# Patient Record
Sex: Female | Born: 1945
Health system: Southern US, Community
[De-identification: ages and names within clinical notes are randomized; demographics above are authoritative.]

## PROBLEM LIST (undated history)

## (undated) DIAGNOSIS — M179 Osteoarthritis of knee, unspecified: Secondary | ICD-10-CM

## (undated) DIAGNOSIS — L718 Other rosacea: Secondary | ICD-10-CM

## (undated) DIAGNOSIS — Z860100 Personal history of colon polyps, unspecified: Secondary | ICD-10-CM

## (undated) DIAGNOSIS — M1990 Unspecified osteoarthritis, unspecified site: Secondary | ICD-10-CM

## (undated) DIAGNOSIS — K623 Rectal prolapse: Secondary | ICD-10-CM

## (undated) DIAGNOSIS — C44320 Squamous cell carcinoma of skin of unspecified parts of face: Secondary | ICD-10-CM

## (undated) DIAGNOSIS — A159 Respiratory tuberculosis unspecified: Secondary | ICD-10-CM

## (undated) DIAGNOSIS — Z87440 Personal history of urinary (tract) infections: Secondary | ICD-10-CM

## (undated) DIAGNOSIS — H269 Unspecified cataract: Secondary | ICD-10-CM

## (undated) DIAGNOSIS — E785 Hyperlipidemia, unspecified: Secondary | ICD-10-CM

## (undated) DIAGNOSIS — K649 Unspecified hemorrhoids: Secondary | ICD-10-CM

## (undated) DIAGNOSIS — F1910 Other psychoactive substance abuse, uncomplicated: Secondary | ICD-10-CM

## (undated) DIAGNOSIS — F32A Depression, unspecified: Secondary | ICD-10-CM

## (undated) DIAGNOSIS — R7612 Nonspecific reaction to cell mediated immunity measurement of gamma interferon antigen response without active tuberculosis: Secondary | ICD-10-CM

## (undated) DIAGNOSIS — M1710 Unilateral primary osteoarthritis, unspecified knee: Secondary | ICD-10-CM

## (undated) DIAGNOSIS — R159 Full incontinence of feces: Secondary | ICD-10-CM

## (undated) DIAGNOSIS — I10 Essential (primary) hypertension: Secondary | ICD-10-CM

## (undated) DIAGNOSIS — F1020 Alcohol dependence, uncomplicated: Secondary | ICD-10-CM

## (undated) DIAGNOSIS — K589 Irritable bowel syndrome without diarrhea: Secondary | ICD-10-CM

## (undated) DIAGNOSIS — K219 Gastro-esophageal reflux disease without esophagitis: Secondary | ICD-10-CM

## (undated) DIAGNOSIS — T7840XA Allergy, unspecified, initial encounter: Secondary | ICD-10-CM

## (undated) DIAGNOSIS — F419 Anxiety disorder, unspecified: Secondary | ICD-10-CM

## (undated) DIAGNOSIS — H10829 Rosacea conjunctivitis, unspecified eye: Secondary | ICD-10-CM

## (undated) HISTORY — DX: Respiratory tuberculosis unspecified: A15.9

## (undated) HISTORY — DX: Other rosacea: L71.8

## (undated) HISTORY — DX: Hyperlipidemia, unspecified: E78.5

## (undated) HISTORY — DX: Osteoarthritis of knee, unspecified: M17.9

## (undated) HISTORY — DX: Other psychoactive substance abuse, uncomplicated: F19.10

## (undated) HISTORY — DX: Rosacea conjunctivitis, unspecified eye: H10.829

## (undated) HISTORY — DX: Alcohol dependence, uncomplicated: F10.20

## (undated) HISTORY — DX: Unilateral primary osteoarthritis, unspecified knee: M17.10

## (undated) HISTORY — PX: TUBAL LIGATION: SHX77

## (undated) HISTORY — PX: BREAST SURGERY: SHX581

## (undated) HISTORY — PX: BREAST EXCISIONAL BIOPSY: SUR124

## (undated) HISTORY — PX: FOOT SURGERY: SHX648

## (undated) HISTORY — DX: Nonspecific reaction to cell mediated immunity measurement of gamma interferon antigen response without active tuberculosis: R76.12

## (undated) HISTORY — DX: Irritable bowel syndrome, unspecified: K58.9

## (undated) HISTORY — DX: Unspecified osteoarthritis, unspecified site: M19.90

## (undated) HISTORY — DX: Rectal prolapse: K62.3

## (undated) HISTORY — DX: Gastro-esophageal reflux disease without esophagitis: K21.9

## (undated) HISTORY — DX: Personal history of urinary (tract) infections: Z87.440

## (undated) HISTORY — DX: Essential (primary) hypertension: I10

## (undated) HISTORY — PX: TONSILLECTOMY AND ADENOIDECTOMY: SUR1326

## (undated) HISTORY — DX: Personal history of colonic polyps: Z86.010

## (undated) HISTORY — DX: Unspecified hemorrhoids: K64.9

## (undated) HISTORY — DX: Unspecified cataract: H26.9

## (undated) HISTORY — PX: SHOULDER SURGERY: SHX246

## (undated) HISTORY — PX: HAND SURGERY: SHX662

## (undated) HISTORY — DX: Personal history of colon polyps, unspecified: Z86.0100

## (undated) HISTORY — DX: Allergy, unspecified, initial encounter: T78.40XA

## (undated) HISTORY — DX: Full incontinence of feces: R15.9

## (undated) HISTORY — DX: Depression, unspecified: F32.A

## (undated) HISTORY — DX: Anxiety disorder, unspecified: F41.9

---
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
## 1998-07-10 ENCOUNTER — Encounter: Admission: RE | Admit: 1998-07-10 | Discharge: 1998-10-08 | Payer: Self-pay | Admitting: Family Medicine

## 1998-08-25 ENCOUNTER — Ambulatory Visit (HOSPITAL_BASED_OUTPATIENT_CLINIC_OR_DEPARTMENT_OTHER): Admission: RE | Admit: 1998-08-25 | Discharge: 1998-08-25 | Payer: Self-pay | Admitting: Orthopaedic Surgery

## 1999-04-27 ENCOUNTER — Ambulatory Visit (HOSPITAL_COMMUNITY): Admission: RE | Admit: 1999-04-27 | Discharge: 1999-04-27 | Payer: Self-pay | Admitting: *Deleted

## 2000-02-25 ENCOUNTER — Other Ambulatory Visit: Admission: RE | Admit: 2000-02-25 | Discharge: 2000-02-25 | Payer: Self-pay | Admitting: *Deleted

## 2000-06-26 ENCOUNTER — Other Ambulatory Visit: Admission: RE | Admit: 2000-06-26 | Discharge: 2000-06-26 | Payer: Self-pay | Admitting: *Deleted

## 2001-02-26 ENCOUNTER — Other Ambulatory Visit: Admission: RE | Admit: 2001-02-26 | Discharge: 2001-02-26 | Payer: Self-pay | Admitting: *Deleted

## 2001-05-11 ENCOUNTER — Encounter (INDEPENDENT_AMBULATORY_CARE_PROVIDER_SITE_OTHER): Payer: Self-pay

## 2001-05-11 ENCOUNTER — Other Ambulatory Visit: Admission: RE | Admit: 2001-05-11 | Discharge: 2001-05-11 | Payer: Self-pay | Admitting: *Deleted

## 2001-06-20 ENCOUNTER — Encounter: Payer: Self-pay | Admitting: Obstetrics and Gynecology

## 2001-06-22 ENCOUNTER — Encounter (INDEPENDENT_AMBULATORY_CARE_PROVIDER_SITE_OTHER): Payer: Self-pay | Admitting: Specialist

## 2001-06-22 ENCOUNTER — Ambulatory Visit (HOSPITAL_COMMUNITY): Admission: RE | Admit: 2001-06-22 | Discharge: 2001-06-22 | Payer: Self-pay | Admitting: Obstetrics and Gynecology

## 2001-07-18 ENCOUNTER — Ambulatory Visit (HOSPITAL_COMMUNITY): Admission: RE | Admit: 2001-07-18 | Discharge: 2001-07-18 | Payer: Self-pay | Admitting: Gastroenterology

## 2001-07-18 ENCOUNTER — Encounter (INDEPENDENT_AMBULATORY_CARE_PROVIDER_SITE_OTHER): Payer: Self-pay

## 2001-08-06 ENCOUNTER — Encounter: Payer: Self-pay | Admitting: Gastroenterology

## 2001-08-06 ENCOUNTER — Encounter: Admission: RE | Admit: 2001-08-06 | Discharge: 2001-08-06 | Payer: Self-pay | Admitting: Gastroenterology

## 2002-02-27 ENCOUNTER — Other Ambulatory Visit: Admission: RE | Admit: 2002-02-27 | Discharge: 2002-02-27 | Payer: Self-pay | Admitting: *Deleted

## 2002-07-03 ENCOUNTER — Ambulatory Visit (HOSPITAL_COMMUNITY): Admission: RE | Admit: 2002-07-03 | Discharge: 2002-07-03 | Payer: Self-pay | Admitting: Family Medicine

## 2003-03-07 ENCOUNTER — Other Ambulatory Visit: Admission: RE | Admit: 2003-03-07 | Discharge: 2003-03-07 | Payer: Self-pay | Admitting: *Deleted

## 2004-03-10 ENCOUNTER — Other Ambulatory Visit: Admission: RE | Admit: 2004-03-10 | Discharge: 2004-03-10 | Payer: Self-pay | Admitting: *Deleted

## 2005-03-14 ENCOUNTER — Other Ambulatory Visit: Admission: RE | Admit: 2005-03-14 | Discharge: 2005-03-14 | Payer: Self-pay | Admitting: *Deleted

## 2007-04-04 ENCOUNTER — Ambulatory Visit (HOSPITAL_BASED_OUTPATIENT_CLINIC_OR_DEPARTMENT_OTHER): Admission: RE | Admit: 2007-04-04 | Discharge: 2007-04-04 | Payer: Self-pay | Admitting: Orthopedic Surgery

## 2009-12-21 LAB — HM PAP SMEAR: HM Pap smear: NORMAL

## 2010-05-11 ENCOUNTER — Ambulatory Visit: Payer: Self-pay | Admitting: Sports Medicine

## 2010-05-11 DIAGNOSIS — M7520 Bicipital tendinitis, unspecified shoulder: Secondary | ICD-10-CM | POA: Insufficient documentation

## 2010-05-11 DIAGNOSIS — M25519 Pain in unspecified shoulder: Secondary | ICD-10-CM | POA: Insufficient documentation

## 2010-06-17 ENCOUNTER — Ambulatory Visit: Payer: Self-pay | Admitting: Sports Medicine

## 2010-06-18 ENCOUNTER — Encounter: Payer: Self-pay | Admitting: Family Medicine

## 2010-06-28 ENCOUNTER — Encounter: Admission: RE | Admit: 2010-06-28 | Discharge: 2010-07-15 | Payer: Self-pay | Admitting: Family Medicine

## 2010-07-15 ENCOUNTER — Ambulatory Visit: Payer: Self-pay | Admitting: Sports Medicine

## 2010-09-15 ENCOUNTER — Encounter: Admission: RE | Admit: 2010-09-15 | Discharge: 2010-09-15 | Payer: Self-pay | Admitting: Family Medicine

## 2010-09-15 ENCOUNTER — Ambulatory Visit: Payer: Self-pay | Admitting: Sports Medicine

## 2010-09-22 ENCOUNTER — Ambulatory Visit: Payer: Self-pay | Admitting: Sports Medicine

## 2010-11-03 ENCOUNTER — Ambulatory Visit: Payer: Self-pay | Admitting: Sports Medicine

## 2010-12-19 DIAGNOSIS — R7612 Nonspecific reaction to cell mediated immunity measurement of gamma interferon antigen response without active tuberculosis: Secondary | ICD-10-CM

## 2010-12-19 DIAGNOSIS — A159 Respiratory tuberculosis unspecified: Secondary | ICD-10-CM

## 2010-12-19 HISTORY — DX: Nonspecific reaction to cell mediated immunity measurement of gamma interferon antigen response without active tuberculosis: R76.12

## 2010-12-19 HISTORY — DX: Respiratory tuberculosis unspecified: A15.9

## 2011-01-18 NOTE — Assessment & Plan Note (Signed)
Summary: NP,R SHOULDER/ARM PAIN,MC   Vital Signs:  Patient profile:   65 year old female Height:      62 inches Weight:      127 pounds BMI:     23.31 BP sitting:   144 / 89  Vitals Entered By: Lillia Pauls CMA (May 11, 2010 10:37 AM)  History of Present Illness: Pt presents for right shoulder pain that she has now had for many years since an injury which has become worse for the past two months. She is right hand dominant. She says that a few years ago she was walking a neighbor's dog when the 60 lb dog suddenly lurched forward hurting her shoulder. She had bruising over the anterior aspect of her upper arm and had decreased range of motion for a while. She never saw a physician for this injury. Since then she has had intermittent pain which she has treated with a variety of exercises that trainers have shown her at her local gym. However, recently her pain has not decreased with the normal exercises. She denies any recent injuries. The pain is now constant and worse with trying to put her hand behind her back. She describes having a bone spur previously removed from her right shoulder years ago. No other pior injuries. She cannot tolerate NSAIDs because of her stomach. Tramadol does not help very much. She gets this from her PCP Dr. Raquel James. She has been resting her right shoulder without any improvement. Does wake her from sleep.   Allergies (verified): 1)  ! Pcn 2)  ! Sulfa 3)  ! Septra  Physical Exam  General:  alert and well-developed.   Head:  normocephalic and atraumatic.   Eyes:  Wears glasses Ears:  Normal hearing Mouth:  MMM Neck:  supple.   Lungs:  normal respiratory effort.   Msk:  Right Shoulder: No bony abnormalities, edema or bruising No Popeye deformity or step offs appreciated + TTP over head of biceps Full forward flexion and abduction 4/5 strength with resisted internal rotation 5/5 strength with resisted external rotation Neg Obrien's and Empty can Able to  put her hand behind her back to her back pocket + Neer's, + Hawkin's Neg Cross over + Speed's with little strength 5/5 strength with resisted biceps curl and triceps testing Weakness with resisted supination which causes pain 5/5 strength with resisted pronation  Left Shoulder: Normal inspection, palpation, ROM and strength Normal special testing Able to put her hand behind her back to L1 Normal biceps, triceps, and deltoid strength Additional Exam:  U/S of her right shoulder shows a normal supraspinatus, subscapularis, teres minor and infraspinatus. No signs of internal impingement. Obvious midsubstance tear of biceps tendon with edema. Possible scarring of distall biceps. Images saved for documentation.    Impression & Recommendations:  Problem # 1:  SHOULDER PAIN, RIGHT (ICD-719.41) Due to old biceps tendon tear of the right shoulder 1. Given a Theraband with an exercise program to start daily for rotator cuff strengthening. Also given a gentle biceps tendon strengthening exercise program with bicep curls and wrist rotations to do daily with a 1-2 lb weight. 2. Given an Rx for nitroglycerin - use as directed. Warned about headaches and monitoring blood pressure. 3. Return in 4 weeks for follow-up.  Orders: Theraband per yard ([id number]) [place] LIMITED [phone])  Problem # 2:  BICEPS TENDINITIS (ICD-726.12)  See above for treatment plan  Orders: [place] LIMITED ([id number])  Complete Medication List: 1)  Nitroglycerin 0.2 Mg/hr Pt24 (Nitroglycerin) .Marland KitchenMarland KitchenMarland Kitchen  Apply 1/4 patch to right shoulder  q 24 hours  Patient Instructions: 1)  Use a 1-2 lb weight and do gentle biceps curls. Quickly bring your hand up to your shoulder and then slowly drop your hand back down towards your legs. Do three sets of 10 first and build up to three sets of 30. 2)  Use again a 1-2 lb weight and rotate your wrist from side to side like you are opening a door. 3)  Use the Theraband to do some of the rotator cuff exercises  to strengthen your shoulder overall. Do these daily. 4)  Use 1/4 of a nitroglycerin patch over your right shoulder every 24 hours. Change after a shower. You may develop headaches the first 3-4 days but this is because of a drop on your blood pressure. You can take Tylenol for the headaches if you develop them. If you develop any dizziness, then take off the patch.  5)  We will see you back in 3-4 weeks for follow-up.  Prescriptions: NITROGLYCERIN 0.2 MG/HR PT24 (NITROGLYCERIN) apply 1/4 patch to right shoulder  Q 24 hours  #10 x 1   Entered by:   Lillia Pauls CMA   Authorized by:   Jannifer Rodney MD   Signed by:   Lillia Pauls CMA on 05/11/2010   Method used:   Electronically to        [facility]. [phone]* (retail)       [address]       [place], [place]  [address]       Ph: [phone]       Fax: [phone]   RxID:   [id number] NITROGLYCERIN 0.2 MG/HR PT24 (NITROGLYCERIN) apply 1/4 patch to right shoulder  Q 24 hours  #10 x 1   Entered and Authorized by:   Jannifer Rodney MD   Signed by:   Jannifer Rodney MD on 05/11/2010   Method used:   Electronically to        [facility]. [phone]* (retail)       [address]       [place], [place]  [address]       Ph: [phone]       Fax: [phone]   RxID:   [id number]

## 2011-01-18 NOTE — Assessment & Plan Note (Signed)
Summary: F/U,MC   Vital Signs:  Patient profile:   65 year old female BP sitting:   135 / 96  Vitals Entered By: Lillia Pauls CMA (June 17, 2010 1:42 PM)  History of Present Illness: Pt presents for follow-up of a partial right biceps tendon tear that likely occurred over a year ago when a dog she was walking lurched forward and hurt her arm. Since her last office visit, she has been doing a home therapy program and is gradually getting stronger. She believes that she is at least 5% better. Her pain has decreased and her right shoulder is no longer waking her up from sleep. She did get headaches the first two days with the nitroglycerin patches but those have since gone away. She feels that she has better strength and is doing her home exercises with a 2lb weight.  Allergies: 1)  ! Pcn 2)  ! Sulfa 3)  ! Septra  Physical Exam  General:  alert and well-developed.   Head:  normocephalic and atraumatic.   Eyes:  Wears glasses Ears:  Normal hearing Mouth:  MMM Neck:  supple and full ROM.   Lungs:  normal respiratory effort.   Msk:  RIght Shoulder: Normal inspection Full ROM without pain with forward flexion and abduction 5/5 strength with resisted IR and ER 4/5 strength with Speed's testing and bicep curls + TTP over midsubstance of mid biceps No TTP over AC joint or shoulder itself Able to put her right hand behind her back to about L2 + Neer's at the extreme, + Hawkin's with some tightness, + Cross over No pain with Yergeson's 5/5 strength with triceps and deltoid testing Starting to almost feel like a frozen shoulder  Left Shoulder: Normal inspection, palpation, ROM and strength Neg special testing   Impression & Recommendations:  Problem # 1:  BICEPS TENDINITIS (ICD-726.12) Assessment Improved Probably a little more than 5% better but still has some strengthening 1. Will refer for physical therapy to keep her from developing a frozen shoulder 2. Will increase her  nitroglycerin to 1/2 patch daily if she can tolerate this. Her blood pressure is still elevated so this will likely be ok. I have asked her to check her BP at home since she has a monitor at home. 3. Return in 4-6 weeks for follow-up. 4. Continue icing. She does not tolerate oral NSAIDs well because of her stomach but could consider a Voltaren gel. 5. Continue home exercises but slowly increase weight per therapist.   Problem # 2:  SHOULDER PAIN, RIGHT (ICD-719.41) Assessment: Improved  Starting to get a little stiff but not painful enough that the patient desires a steroid injection today 1. Will refer for physical therapy to prevent a frozen shoulder if possible and to help with biceps strengthening  Orders: Physical Therapy Referral (PT)  Complete Medication List: 1)  Nitroglycerin 0.2 Mg/hr Pt24 (Nitroglycerin) .... Apply 1/4-1/2 patch to right shoulder  q 24 hours Prescriptions: NITROGLYCERIN 0.2 MG/HR PT24 (NITROGLYCERIN) apply 1/4-1/2 patch to right shoulder  Q 24 hours  #1 box x 2   Entered and Authorized by:   Jannifer Rodney MD   Signed by:   Jannifer Rodney MD on 06/18/2010   Method used:   Electronically to        [facility]. [phone]* (retail)       [address]       [place], [place]  [address]  Ph: [phone]       Fax: [phone]   RxID:   [id number]

## 2011-01-18 NOTE — Assessment & Plan Note (Signed)
Summary: U/S SHOULDER,MC   Vital Signs:  Patient profile:   65 year old female Pulse rate:   90 / minute BP sitting:   107 / 75  (right arm)  Vitals Entered By: Rochele Pages RN (November 03, 2010 1:36 PM) CC: f/u rt shoulder- 75% improved   CC:  f/u rt shoulder- 75% improved.  History of Present Illness: 65 yo F f/u Rt shoulder pain.  Previously had mild frozen shoulder, but improved.  Repeat US 6 weeks ago showed bic ten tear.  RC intact.  Xray also shows mild AC arthritis. States turned corner, pain 75% better. Still has a little pain and stiffness with reaching OH.  Mostly anterior location. Still currently on NTG 1/2 patch daily. Stopped mobic because of upset stomach. Still doing RC exercises.  Preventive Screening-Counseling & Management  Alcohol-Tobacco     Smoking Status: quit > 6 months  Allergies: 1)  ! Pcn 2)  ! Sulfa 3)  ! Septra  Social History: Smoking Status:  quit > 6 months  Physical Exam  General:  Well-developed,well-nourished,in no acute distress; alert,appropriate and cooperative throughout examination Msk:  Rt shoulder: mild ttp over bic groove.  F flex 160 deg, Abd 160 deg, ER 45 deg, IR L4.  FROM on Lt shoulder.  Nl RC strength.  Neg impingement.  Mild pain with Speed's, neg yergasson's.  No ttp over AC.  [place]: No obvious tear or increased doppler flow on bic tendon.  RC tendons all intact.   Impression & Recommendations:  Problem # 1:  BICEPS TENDINITIS (ICD-726.12) Assessment Improved  Greatly improved - since has been on NTG patch x 6 months, ok to stop - continue rehab exercises - f/u as needed if worsens  Orders: [place] LIMITED ([id number])  Complete Medication List: 1)  Nitroglycerin 0.2 Mg/hr Pt24 (Nitroglycerin) .... Apply 1/4-1/2 patch to right shoulder  q 24 hours 2)  Lisinopril-hydrochlorothiazide 20-25 Mg Tabs (Lisinopril-hydrochlorothiazide) .Marland Kitchen.. 1 by mouth qd 3)  Trazodone Hcl 150 Mg Tabs (Trazodone hcl) .Marland Kitchen.. 1 by mouth qd 4)   Lovastatin 40 Mg Tabs (Lovastatin) .... 2 by mouth qhs 5)  Tramadol Hcl 50 Mg Tabs (Tramadol hcl) .Marland Kitchen.. 1 by mouth bid   Orders Added: 1)  Est. Patient Level III [60454] 2)  [place] LIMITED [[id number]]

## 2011-01-18 NOTE — Assessment & Plan Note (Signed)
Summary: f/u,mc   Vital Signs:  Patient profile:   65 year old female Height:      62 inches BP sitting:   126 / 78  (left arm)  Vitals Entered By: Tessie Fass CMA (July 15, 2010 1:36 PM) CC: F/U right shoulder   CC:  F/U right shoulder.  History of Present Illness: Patient is f/u of chronic RT shoulder pain sleeps thru nite Not much pain ADLs ecept reaching too far  has gained almost full motion since PT  tolerating half patch w NTG  Strength is much better but a few exercises hurt  on first scan she had fairly high grade partial tear of bicipital tendon findings of early Adh capsulitis    Allergies: 1)  ! Pcn 2)  ! Sulfa 3)  ! Septra  Physical Exam  General:  Well-developed,well-nourished,in no acute distress; alert,appropriate and cooperative throughout examination Msk:  RT Inspection reveals no abnormalities or assymetry; no atrophy noted; palpation is unremarkable;  ROM is full in all planes after looseining. back scratch is limited on RT to about T10 . specific strength testing of Rotator cuff mm reveals good strength throughout; no signs of impingement; speeds and yergason's tests normal;  no labral pathology noted; norm scapular function observed.  negative painful arc and no drop arm sign.  Additional Exam:  MSK [place] there is still a partial tear of bicipital tendon but now the degree of cystic fluid collection is less the tendinous structure is improved hypoechoic areas still noted abut 4 cm below biciptial notch  images noted   Impression & Recommendations:  Problem # 1:  SHOULDER PAIN, RIGHT (ICD-719.41)  much improved  cont NTG  at half patch as we see signs of healing  Orders: [place] LIMITED ([id number])  Problem # 2:  BICEPS TENDINITIS (ICD-726.12)  steady improvement  will follow but allow to switch to a home exercise program  Orders: [place] LIMITED ([id number])  Complete Medication List: 1)  Nitroglycerin 0.2 Mg/hr Pt24 (Nitroglycerin) ....  Apply 1/4-1/2 patch to right shoulder  q 24 hours  Patient Instructions: 1)  return in 2 mos 2)  keep using NTG 3)  do home exercises with band 4)  keep weight exercises to level of comfort - drop to 1 or 2 lbs until no pain 5)  be careful lifting anything heavy 6)  OK to cancel PT

## 2011-01-18 NOTE — Assessment & Plan Note (Signed)
Summary: SHOULDER U/S PER FIELDS,MC   Vital Signs:  Patient profile:   65 year old female Pulse rate:   75 / minute BP sitting:   132 / 88  (right arm)  Vitals Entered By: Rochele Pages RN (September 22, 2010 2:41 PM) CC: f/u rt shoulder u/s today   CC:  f/u rt shoulder u/s today.  History of Present Illness: 65 yo F here for R shoulder f/u.  Had prev been dxed with biceps tendon tear, but treatment plateued. Had 3-V R shoulder with mild AC joint arthritis. Shoulder still hurts, but mobic helping some. Still difficult reaching back. Presents today for repeat US of R shoulder. Continues on NTG patch.  Allergies: 1)  ! Pcn 2)  ! Sulfa 3)  ! Septra  Physical Exam  General:  Well-developed,well-nourished,in no acute distress; alert,appropriate and cooperative throughout examination Msk:  Shoulder: Inspection reveals no abnormalities, atrophy or asymmetry. ROM is full in all planes on L shoulder, L shoulder f flex 160 deg, abd 170 deg, IR to L3, ER to 30-45 deg. Rotator cuff strength normal throughout. Speeds test normal. No apprehension sign  MSK [place] of R shoulder: small tear in biceps tendon 4 cm distal to bic groove, with small amt of fluid in sheath.  Subscap, supraspin, and infraspin all intact.      Impression & Recommendations:  Problem # 1:  BICEPS TENDINITIS (ICD-726.12) At this point, still has a mild tear with some residual fluid in sheath.  Unclear if this will ever heal entirely. - continue NTG patch - f/u for repeat scan in 6 weeks  Problem # 2:  SHOULDER PAIN, RIGHT (ICD-719.41) Some 2/2 above, but also has some chronic shoulder decreased ROM and pain Only mild AC arthritis with possible mild frozen shoulder as well.  however RC strength is great - continue ROM exercises - continue mobic - f/u 6 weeks  Her updated medication list for this problem includes:    Tramadol Hcl 50 Mg Tabs (Tramadol hcl) .Marland Kitchen... 1 by mouth bid    Meloxicam 7.5 Mg Tabs (Meloxicam)  .Marland Kitchen... 1 tab by mouth qd  Complete Medication List: 1)  Nitroglycerin 0.2 Mg/hr Pt24 (Nitroglycerin) .... Apply 1/4-1/2 patch to right shoulder  q 24 hours 2)  Lisinopril-hydrochlorothiazide 20-25 Mg Tabs (Lisinopril-hydrochlorothiazide) .Marland Kitchen.. 1 by mouth qd 3)  Trazodone Hcl 150 Mg Tabs (Trazodone hcl) .Marland Kitchen.. 1 by mouth qd 4)  Lovastatin 40 Mg Tabs (Lovastatin) .... 2 by mouth qhs 5)  Tramadol Hcl 50 Mg Tabs (Tramadol hcl) .Marland Kitchen.. 1 by mouth bid 6)  Meloxicam 7.5 Mg Tabs (Meloxicam) .Marland Kitchen.. 1 tab by mouth qd  Appended Document: SHOULDER U/S PER FIELDS,MC

## 2011-01-18 NOTE — Letter (Signed)
Summary: [facility] referral form  [facility] referral form   Imported By: Marily Memos 06/29/2010 11:53:06  _____________________________________________________________________  External Attachment:    Type:   Image     Comment:   External Document

## 2011-01-18 NOTE — Miscellaneous (Signed)
Summary: [facility]  [facility]   Imported By: Marily Memos 07/16/2010 09:02:58  _____________________________________________________________________  External Attachment:    Type:   Image     Comment:   External Document

## 2011-01-18 NOTE — Assessment & Plan Note (Signed)
Summary: F/U,MC   Vital Signs:  Patient profile:   65 year old female Pulse rate:   77 / minute BP sitting:   146 / 82  (right arm) CC: f/u rt arm pain   CC:  f/u rt arm pain.  History of Present Illness: 65 yo F with chronic R shoulder pain and partially torn biceps tendon here for f/u. Is currently on 1/2 NTG patch per day.  Does not think it is helping. Still doing her home exercises. Has done formal PT. Has gotten better since 5/11 but no change over last 2 months. Still having fair amount of pain constantly and mildly decreased ROM.  Allergies: 1)  ! Pcn 2)  ! Sulfa 3)  ! Septra  Physical Exam  General:  Well-developed,well-nourished,in no acute distress; alert,appropriate and cooperative throughout examination Msk:  Shoulder: Inspection reveals no abnormalities, atrophy or asymmetry. Palpation with some crepitus over AC joint and mild TTP over bicipital groove. ROM nl on L side, R shouler 160-170 deg with for flex and abd, L4 on IR, and 30 deg on ER. Rotator cuff strength normal throughout. No signs of impingement with negative Neer and Hawkin's tests, empty can. Yergason's test normal, Speed's with mild weakness and pain. + mild pain cross adduction.      Impression & Recommendations:  Problem # 1:  SHOULDER PAIN, RIGHT (ICD-719.41) Assessment Unchanged Previously felt it was all biceps tear/tendinitis/tendinosis.  Still has some findings c/w this, however also with decreased ROM worrisome for GH or AC OA as well as mild continued frozen shoulder. - check 3-V of shoulder - trial of meloxicam 7.5 mg qd - RTC 1 week for full diagnostic US of R shoulder - ok to stop NTG patch - continue home exercises  Her updated medication list for this problem includes:    Tramadol Hcl 50 Mg Tabs (Tramadol hcl) .Marland Kitchen... 1 by mouth bid    Meloxicam 7.5 Mg Tabs (Meloxicam) .Marland Kitchen... 1 tab by mouth qd  Orders: Diagnostic X-Ray/Fluoroscopy (Diagnostic X-Ray/Flu)  Problem # 2:   BICEPS TENDINITIS (ICD-726.12) improvement has plateaued, ok to stop NTG patches  Complete Medication List: 1)  Nitroglycerin 0.2 Mg/hr Pt24 (Nitroglycerin) .... Apply 1/4-1/2 patch to right shoulder  q 24 hours 2)  Lisinopril-hydrochlorothiazide 20-25 Mg Tabs (Lisinopril-hydrochlorothiazide) .Marland Kitchen.. 1 by mouth qd 3)  Trazodone Hcl 150 Mg Tabs (Trazodone hcl) .Marland Kitchen.. 1 by mouth qd 4)  Lovastatin 40 Mg Tabs (Lovastatin) .... 2 by mouth qhs 5)  Tramadol Hcl 50 Mg Tabs (Tramadol hcl) .Marland Kitchen.. 1 by mouth bid 6)  Meloxicam 7.5 Mg Tabs (Meloxicam) .Marland Kitchen.. 1 tab by mouth qd Prescriptions: MELOXICAM 7.5 MG TABS (MELOXICAM) 1 tab by mouth qd  #30 x 1   Entered by:   Enid Baas MD   Authorized by:   Corbin Ade MD   Signed by:   Enid Baas MD on 09/15/2010   Method used:   Electronically to        [facility]. [phone]* (retail)       [address]       [place], [place]  [address]       Ph: [phone]       Fax: [phone]   RxID:   [id number]

## 2011-02-15 LAB — LIPID PANEL
HDL: 104 mg/dL — AB (ref 35–70)
LDL Cholesterol: 100 mg/dL
Triglycerides: 48 mg/dL (ref 40–160)

## 2011-05-06 NOTE — Op Note (Signed)
[facility]  Patient:    Cheryl Kent, Cheryl Kent                          MRN: [id number] Proc. Date: 06/22/01 Attending:  Laqueta Linden, M.D.                           Operative Report  OUTPATIENT  PREOPERATIVE DIAGNOSIS:  Postmenopausal bleeding, possible atypical polyp.  POSTOPERATIVE DIAGNOSIS:  Postmenopausal bleeding, possible atypical polyp.  OPERATION/PROCEDURE:  Hysteroscopy with dilatation and curettage.  SURGEON: Laqueta Linden, M.D.  ANESTHESIA:  MAC sedation with paracervical block.  ESTIMATED BLOOD LOSS:  Less than 10 cc.  Net sorbitol intake was 0.  SPECIMENS:  Curettings sent to pathology.  COMPLICATIONS:  None.  INDICATIONS:  Cheryl Kent is a 65 year old menopausal female on hormonal replacement therapy with postmenopausal bleeding.  She underwent evaluation with pelvic ultrasound and sonohystogram revealing a 4.3 mm endometrial stripe with calcifications at the endometrial, myometrial junction with no focal thickening.  Sonohystogram confirmed the calcifications, but had no evidence of focal lesion.  She then underwent endometrial sampling in the office which revealed a fragment of benign endometrial polyp with scanty proliferative type endometrium with a minute focus of glandular atypia within the polyp that was felt to possibly be reactive metastatic change, although the amount of atypia was very minute.  Due to this question of an atypical polyp it was felt appropriate to proceed to a more thorough evaluation including hysteroscopy with curettage and resection as indicated.  The patient has seen the informed consent film, been extensively counseled as to the risks, benefits, alternatives, and complications and agrees to proceed.  A full consent has been given.  DESCRIPTION OF PROCEDURE:  The patient was taken to the operating room and after proper identification and consent was obtained she was placed on the operating table in a  supine position.  After IV sedation was accomplished and she was placed in the [place] stirrups and the perineum and vagina were prepped and draped in a routine sterile fashion.  A transurethral Foley was placed which was removed at the conclusion of the procedure.  Bimanual examination confirmed a midplane, normal size uterus.  A speculum was placed in the vagina and the cervix grasped with a single-tooth tenaculum.  A paracervical block utilizing 10 cc of 1% plain Xylocaine was then placed circumferentially.  The uterine cavity sounded to 8 cm.  The internal os was gently dilated to a #23 Pratt dilator.  The viewing scope with continuous sorbitol infusion was then inserted, ______ was used throughout the procedure.  The endocervical canal was free of lesions.  There was a slightly erythematous area noted on the fundus which was felt to possibly be related to the dilators.  Both tubal ostia were visualized.  Several small calcifications were noted.  There was a tiny polyp noted on the left uterine side wall.  The scope was removed.  Sharp curettage was then performed with a moderate amount of tissue obtained.  The scope was then replaced and no focal defects were noted.  The tiny polyps had been removed.  The specimen was sent to pathology.  All instruments were removed.  There was no active bleeding from the cervix or the tenaculum site.  Net sorbitol intake was 0.  The patient was stable on transfer to the recovery room.  She will be observed and discharged per anesthesia  protocol.  She was given routine verbal and written discharge instructions.  She is to continue on her current medications and to take Advil or Aleve as needed for any cramping.  She is to follow up in the office in four to six weeks time or sooner for excessive pain, fever, bleeding, or other concerns. DD:  06/22/01 TD:  06/22/01 Job: [id number]

## 2011-05-06 NOTE — Procedures (Signed)
[place]. [facility]  Patient:    Cheryl Kent, Cheryl Kent                          MRN: [id number] Proc. Date: 07/18/01 Adm. Date:  [id number] Attending:  Charna Elizabeth CC:         Talmadge Coventry, M.D.   Procedure Report  DATE OF BIRTH: 1946-01-23  PROCEDURE: Colonoscopy with biopsies.  ENDOSCOPIST: Anselmo Rod, M.D.  INSTRUMENT USED: Olympus video colonoscope.  INDICATIONS FOR PROCEDURE: This is a 65 year old white female with a history of rectal bleeding and fecal incontinence, with recent change in bowel habits, to rule out colonic polyps, masses, hemorrhoids, etc.  PREPROCEDURE PREPARATION: Informed consent was procured from the patient and the patient fasted for eight hours prior to the procedure and prepped with a bottle of magnesium citrate and gallon of NuLytely the night prior to the procedure.  PREPROCEDURE PHYSICAL EXAMINATION:  VITAL SIGNS: Stable.  NECK: Supple.  CHEST: Clear to auscultation.  HEART: S1 and S2, regular.  ABDOMEN: Soft, nondistended, nontender, with normal bowel sounds.  DESCRIPTION OF PROCEDURE: The patient was placed in the left lateral decubitus position and sedated with 70 mg of Demerol and 5 mg of Versed intravenously. Once the patient was adequately sedated and maintained on low-flow oxygen with continuous cardiac monitoring the Olympus video colonoscope was advanced from the rectum to the cecum with slight difficulty secondary to a large amount of residual in the colon.  On anal inspection and examination there was significantly decreased sphincter tone.  A small flat polyp was biopsied at 20 cm.  The patient has some left-sided diverticulosis.  No large masses or polyps were seen.  Small lesions could have been missed secondary to relatively inadequate prep.  IMPRESSION:  1. Decreased sphincter tone, which may be contributing to the patients     fecal incontinence.  2. Small flat polyp biopsied at 20  cm.  3. Left-sided diverticulosis.  4. Large amount of residual stool in the colon, small lesions may have been     missed.  5. Question extrinsic compression of rectum.  RECOMMENDATIONS:  1. Await pathology results.  2. High fiber diet.  3. Outpatient follow-up for further evaluation of symptoms. DD:  07/18/01 TD:  07/19/01 Job: [id number]

## 2011-05-06 NOTE — Op Note (Signed)
Cheryl Kent, Cheryl Kent NO.:  [id number]   MEDICAL RECORD NO.:  [id number]          PATIENT TYPE:  AMB   LOCATION:  DSC                          FACILITY:  [facility]   PHYSICIAN:  Artist Pais. Weingold, M.D.DATE OF BIRTH:  03-10-1946   DATE OF PROCEDURE:  04/04/2007  DATE OF DISCHARGE:                               OPERATIVE REPORT   PREOPERATIVE DIAGNOSIS:  Chronic right thumb stenosing tenosynovitis.   POSTOPERATIVE DIAGNOSIS:  Chronic right thumb stenosing tenosynovitis.   PROCEDURE:  Right thumb A1 pulley release.   SURGEON:  Artist Pais. Mina Marble, M.D.   ASSISTANT:  None.   ANESTHESIA:  Monitored anesthesia care with a combination of 2% plain  lidocaine, 0.25% Marcaine field block 3 mL.   TOURNIQUET TIME:  10 minutes.   COMPLICATIONS:  None.   DRAINS:  None.   OPERATIVE REPORT:  The patient was taken to the operating suite.  After  the induction of adequate IV sedation, right upper extremity is prepped  and draped a sterile fashion. The Esmarch was used to exsanguinate the  limb.  The tourniquet was inflated to 150 mmHg.  At this point in time,  3 mL of a combination of 2% plain lidocaine and 0.25% plain Marcaine was  injected into the A1 pulley area.  A transverse incision was made.  Neurovascular bundles were gently retracted.  The A1 pulley was split  __________  was lysed of all adhesions.  The wound was irrigated and  this was closed with 4-0 nylon x3 sutures.  Xeroform, 4 x 4's,  compressive wrap was applied.  The patient tolerated the procedure well.      Artist Pais Mina Marble, M.D.  Electronically Signed     MAW/MEDQ  D:  04/04/2007  T:  04/04/2007  Job:  [id number]

## 2011-06-16 ENCOUNTER — Other Ambulatory Visit: Payer: Self-pay | Admitting: Infectious Diseases

## 2011-06-16 ENCOUNTER — Ambulatory Visit
Admission: RE | Admit: 2011-06-16 | Discharge: 2011-06-16 | Disposition: A | Discharge status: Home / Self Care | Payer: No Typology Code available for payment source | Source: Ambulatory Visit | Attending: Infectious Diseases | Admitting: Infectious Diseases

## 2011-06-16 DIAGNOSIS — R7611 Nonspecific reaction to tuberculin skin test without active tuberculosis: Secondary | ICD-10-CM

## 2011-09-26 LAB — HEPATIC FUNCTION PANEL: ALT: 53 U/L — AB (ref 7–35)

## 2011-11-23 DIAGNOSIS — K219 Gastro-esophageal reflux disease without esophagitis: Secondary | ICD-10-CM | POA: Insufficient documentation

## 2011-11-23 DIAGNOSIS — I10 Essential (primary) hypertension: Secondary | ICD-10-CM | POA: Insufficient documentation

## 2011-11-23 DIAGNOSIS — F411 Generalized anxiety disorder: Secondary | ICD-10-CM | POA: Insufficient documentation

## 2011-11-23 DIAGNOSIS — E7800 Pure hypercholesterolemia, unspecified: Secondary | ICD-10-CM | POA: Insufficient documentation

## 2011-11-24 ENCOUNTER — Encounter: Payer: Self-pay | Admitting: Internal Medicine

## 2011-11-24 ENCOUNTER — Ambulatory Visit (INDEPENDENT_AMBULATORY_CARE_PROVIDER_SITE_OTHER): Payer: Medicare Other | Admitting: Internal Medicine

## 2011-11-24 VITALS — BP 129/83 | HR 95 | Temp 98.1°F | Wt 128.0 lb

## 2011-11-24 DIAGNOSIS — Z227 Latent tuberculosis: Secondary | ICD-10-CM

## 2011-11-24 DIAGNOSIS — R7611 Nonspecific reaction to tuberculin skin test without active tuberculosis: Secondary | ICD-10-CM | POA: Insufficient documentation

## 2011-11-24 DIAGNOSIS — A150 Tuberculosis of lung: Secondary | ICD-10-CM

## 2011-11-24 LAB — COMPREHENSIVE METABOLIC PANEL
ALT: 41 U/L — ABNORMAL HIGH (ref 0–35)
AST: 49 U/L — ABNORMAL HIGH (ref 0–37)
BUN: 12 mg/dL (ref 6–23)
CO2: 33 mEq/L — ABNORMAL HIGH (ref 19–32)
Creat: 0.83 mg/dL (ref 0.50–1.10)
Total Bilirubin: 0.7 mg/dL (ref 0.3–1.2)

## 2011-11-24 MED ORDER — RIFAPENTINE 150 MG PO TABS: 900.0000 mg | ORAL_TABLET | ORAL | Status: DC

## 2011-11-24 MED ORDER — ISONIAZID 300 MG PO TABS: 900.0000 mg | ORAL_TABLET | ORAL | Status: AC

## 2011-11-24 NOTE — Progress Notes (Signed)
INFECTIOUS DISEASES CLINIC  INITIAL VISIT  RFV: latent TB  Subjective:    Patient ID: Cheryl Kent, female    DOB: 1946-01-02, 65 y.o.   MRN: [id number]  HPI Cheryl Kent is a [name] that has history of HTN, HLD, GAD who enrolled as a volunteer for [facility] cancer center. She was found to have PPD+/quantiferon gold+ in June 2012. She underwent CXR that showed small granuloma in apex of RUL, but did not subscribe to cough, weight loss, fever/chills/nightsweats. She was referred to health department to start 9 month INH/vit B6 regimen. In her routine blood work, she was noted to start having increases in her transaminitis at the end of 1st and 2nd month. Due to this concern that it might worsen, given that she will be on therapy for 9 months, she discontinued her medication. The patient denies any jaundice, scleral icterus, RUQ pain, N/V/D, dark urine. The patient does not know how she was exposed to TB, denies international travel, working at homeless shelter. She does subscribe to having been reckless in her 30s with heavy alcohol abuse for which she attended a rehab program and has abstained since the age of 65. She denies having cirrhosis, or complications of cirrhosis. She did mention that they told her she had evidence of liver disease that it would improve over the years that she is sober.  The patient also mentioned that despite having increases in her transaminases, the health department did not suggest that she should stop therapy.The only records we have is from 09/26/11 where her AST 91 and ALT 53, reflecting improvement from the peak. The patient's PCP has referred her to the ID clinic for alternative regimens. The patient is concerned with any side effects that the latent TB treatment may have with her current meds, specifically trazadone for which she uses for her underlying anxiety disorder.  Meds:  crestor Lisinopril HCTZ trazadone 150mg  QHS Asa 81mg  daily mvi Xanax 0.25mg  PRN anxiety, usually  3x per week Tramadol 50mg  daily  All: penicillin as a infant- unknown SE,  Intolerance: bactrim - had sores under her tongue(no swelling, no rash)  SH: she is a widow x 2. No children. Step-children. She is retired worked in a Primary school teacher work. She volunteers at [employer] at cancer center. No present alcohol, smoking or illicit drug use.   FH: stroke, DM, alzheimer's disease   Review of Systems  Constitutional: Negative.  Negative for fever, chills, diaphoresis and fatigue.  HENT: Negative for ear pain, congestion, rhinorrhea, mouth sores, neck pain and neck stiffness.   Eyes: Negative.   Respiratory: Negative for cough, choking, chest tightness, shortness of breath and wheezing.   Cardiovascular: Negative for chest pain and leg swelling.  Gastrointestinal: Negative for abdominal pain, diarrhea, constipation, blood in stool and abdominal distention.  Genitourinary: Negative for dysuria, frequency, flank pain and difficulty urinating.  Musculoskeletal: Negative for myalgias, back pain, joint swelling and arthralgias.  Skin: Negative for color change, pallor and rash.  Neurological: Negative.   Hematological: Negative.   Psychiatric/Behavioral: Positive for sleep disturbance.       Slightly anxious       Objective:BP 129/83  Pulse 95  Temp(Src) 98.1 F (36.7 C) (Oral)  Wt 128 lb (58.06 kg)    Physical Exam  Constitutional: She is oriented to person, place, and time. She appears well-developed. No distress.  HENT:  Right Ear: External ear normal.  Left Ear: External ear normal.  Nose: Nose normal.  Mouth/Throat: Oropharynx  is clear and moist. No oropharyngeal exudate.  Eyes: Conjunctivae are normal. Pupils are equal, round, and reactive to light. Right eye exhibits no discharge. Left eye exhibits no discharge. No scleral icterus.  Neck: Normal range of motion. Neck supple. No tracheal deviation present.  Cardiovascular: Normal rate, regular rhythm and normal heart sounds.   Exam reveals no gallop and no friction rub.   No murmur heard. Pulmonary/Chest: Effort normal and breath sounds normal. No stridor. No respiratory distress. She has no wheezes. She has no rales. She exhibits no tenderness.  Abdominal: Soft. Bowel sounds are normal. She exhibits no distension and no mass. There is no tenderness. There is no rebound and no guarding.  Musculoskeletal: Normal range of motion. She exhibits no edema and no tenderness.  Lymphadenopathy:    She has no cervical adenopathy.  Neurological: She is alert and oriented to person, place, and time. No cranial nerve deficit. She exhibits normal muscle tone. Coordination normal.  Skin: Skin is warm and dry. No rash noted. She is not diaphoretic. No erythema. No pallor.  Psychiatric: She has a normal mood and affect.   Old labs/imaging: Cxr: granuloma in Right hilar area     Assessment & Plan:  65 yo Female with latent TB who had mild transaminitis on INH x 2 months, switching regimen for treatment.  1) will check baseline LFT at this visit  2) will propose to do 12-wk regimen on INH 900mg  and Rifapentin 900mg  Q wk dosing, as per CDC recommendations. Patient is motivated to do treatment and is willing to check in weekly so that she has taken her medications.   3) we will check LFTs initially weekly to see that she does not have transaminitis/hepatitis 4) if patient has hepatitis side effect, then we will do rifampin daily x 4 month regimen.  5) anxiety = will on INH and rifapentin, the patient's trazadone maybe metabolized quicklier. If she feels that her anxiety symptoms are worsening, we will titrate up her dose to 200mg  daily. Making sure to got back to home dose once she finishes treatment  Patient's cell: [phone] Pharm: [facility]    Recommendations for Use of an Isoniazid-Rifapentine Regimen with Direct Observation to Treat Latent Mycobacterium tuberculosis Infection. MMWR  2011;60:1650-1653

## 2011-11-28 ENCOUNTER — Ambulatory Visit (INDEPENDENT_AMBULATORY_CARE_PROVIDER_SITE_OTHER): Payer: Medicare Other | Admitting: Family Medicine

## 2011-11-28 DIAGNOSIS — F411 Generalized anxiety disorder: Secondary | ICD-10-CM

## 2011-11-28 DIAGNOSIS — J060 Acute laryngopharyngitis: Secondary | ICD-10-CM

## 2011-11-30 ENCOUNTER — Telehealth: Payer: Self-pay | Admitting: *Deleted

## 2011-11-30 NOTE — Telephone Encounter (Signed)
Chills/fever/headache lasting 3-4 hours within 24 hours of taking INH on Sunday, 11/27/11.  Pt wanted Dr. Drue Second know about these side effects.  No symptoms after taking Rifapentin on Monday.   Pt requesting a call back from Center.  [phone]  Please advise.

## 2011-12-07 ENCOUNTER — Other Ambulatory Visit: Payer: Self-pay | Admitting: Internal Medicine

## 2011-12-07 DIAGNOSIS — Z79899 Other long term (current) drug therapy: Secondary | ICD-10-CM

## 2011-12-07 DIAGNOSIS — R7611 Nonspecific reaction to tuberculin skin test without active tuberculosis: Secondary | ICD-10-CM

## 2011-12-08 ENCOUNTER — Other Ambulatory Visit: Payer: Medicare Other

## 2011-12-08 DIAGNOSIS — Z79899 Other long term (current) drug therapy: Secondary | ICD-10-CM

## 2011-12-08 DIAGNOSIS — R7611 Nonspecific reaction to tuberculin skin test without active tuberculosis: Secondary | ICD-10-CM

## 2011-12-08 LAB — HEPATIC FUNCTION PANEL
AST: 50 U/L — ABNORMAL HIGH (ref 0–37)
Albumin: 3.9 g/dL (ref 3.5–5.2)
Alkaline Phosphatase: 51 U/L (ref 39–117)
Indirect Bilirubin: 0.3 mg/dL (ref 0.0–0.9)
Total Bilirubin: 0.5 mg/dL (ref 0.3–1.2)

## 2012-01-15 ENCOUNTER — Encounter: Payer: Self-pay | Admitting: Family Medicine

## 2012-01-25 ENCOUNTER — Encounter: Payer: Self-pay | Admitting: Family Medicine

## 2012-01-30 ENCOUNTER — Ambulatory Visit (INDEPENDENT_AMBULATORY_CARE_PROVIDER_SITE_OTHER): Payer: Medicare Other | Admitting: Family Medicine

## 2012-01-30 ENCOUNTER — Encounter: Payer: Self-pay | Admitting: Family Medicine

## 2012-01-30 DIAGNOSIS — E7800 Pure hypercholesterolemia, unspecified: Secondary | ICD-10-CM

## 2012-01-30 DIAGNOSIS — E785 Hyperlipidemia, unspecified: Secondary | ICD-10-CM

## 2012-01-30 DIAGNOSIS — R7611 Nonspecific reaction to tuberculin skin test without active tuberculosis: Secondary | ICD-10-CM

## 2012-01-30 DIAGNOSIS — I10 Essential (primary) hypertension: Secondary | ICD-10-CM

## 2012-01-30 DIAGNOSIS — F411 Generalized anxiety disorder: Secondary | ICD-10-CM

## 2012-01-30 DIAGNOSIS — K219 Gastro-esophageal reflux disease without esophagitis: Secondary | ICD-10-CM

## 2012-01-30 LAB — LIPID PANEL
HDL: 76 mg/dL (ref >39)
LDL Cholesterol: 89 mg/dL (ref 0–99)
Total CHOL/HDL Ratio: 2.3 Ratio
Triglycerides: 45 mg/dL (ref <150)
VLDL: 9 mg/dL (ref 0–40)

## 2012-01-30 LAB — COMPREHENSIVE METABOLIC PANEL
AST: 138 U/L — ABNORMAL HIGH (ref 0–37)
Albumin: 3.8 g/dL (ref 3.5–5.2)
Alkaline Phosphatase: 50 U/L (ref 39–117)
BUN: 19 mg/dL (ref 6–23)
Creat: 0.77 mg/dL (ref 0.50–1.10)
Glucose, Bld: 101 mg/dL — ABNORMAL HIGH (ref 70–99)
Potassium: 3.9 mEq/L (ref 3.5–5.3)
Total Bilirubin: 0.4 mg/dL (ref 0.3–1.2)

## 2012-01-30 LAB — TSH: TSH: 1.639 u[IU]/mL (ref 0.350–4.500)

## 2012-01-30 MED ORDER — ROSUVASTATIN CALCIUM 10 MG PO TABS: 10.0000 mg | ORAL_TABLET | Freq: Every day | ORAL | Status: DC

## 2012-01-30 MED ORDER — LISINOPRIL-HYDROCHLOROTHIAZIDE 20-25 MG PO TABS: 1.0000 | ORAL_TABLET | Freq: Every day | ORAL | Status: DC

## 2012-01-30 MED ORDER — TRAZODONE HCL 150 MG PO TABS: 150.0000 mg | ORAL_TABLET | Freq: Every day | ORAL | Status: DC

## 2012-01-30 MED ORDER — PAROXETINE HCL 20 MG PO TABS: 20.0000 mg | ORAL_TABLET | Freq: Every day | ORAL | Status: DC

## 2012-01-30 NOTE — Assessment & Plan Note (Signed)
Tolerating antihypertensive medication without side effects. Needs refills.  Denies CP, SOB, visual disturbance or focal deficits.

## 2012-01-30 NOTE — Assessment & Plan Note (Signed)
Currently without symptoms 

## 2012-01-30 NOTE — Assessment & Plan Note (Signed)
Doing well on Paxil however recently experiencing constipations secondary to Paxil.   Using Miralax daily with relief of constipation. Patient increased Trazadone to 150 mg and now is sleeping through the night. Needs refill of Trazadone.

## 2012-01-30 NOTE — Assessment & Plan Note (Signed)
Patient has been compliant with Crestor without side effects. Here for lipid panel and refills.  Exercises 4-5 times a week for 1 hour. Avoids cholesterol rich foods. (Has seen nutrition in the past)

## 2012-01-30 NOTE — Progress Notes (Signed)
  Subjective:    Patient ID: Cheryl Kent, female    DOB: 1946-07-05, 66 y.o.   MRN: [id number]  HPI Patient presents for routine follow up.  Needing medication refills and blood work.     Review of Systems  Constitutional: Negative.   Eyes: Negative for visual disturbance.  Respiratory: Negative for shortness of breath.   Cardiovascular: Negative for chest pain and leg swelling.       Objective:   Physical Exam  Constitutional: She appears well-developed and well-nourished.  HENT:  Head: Normocephalic and atraumatic.  Eyes: EOM are normal. Pupils are equal, round, and reactive to light. No scleral icterus.  Neck: Neck supple. No thyromegaly present.  Cardiovascular: Normal rate, regular rhythm and normal heart sounds.   Pulmonary/Chest: Effort normal and breath sounds normal.  Abdominal: Soft. Bowel sounds are normal. There is no hepatosplenomegaly.  Neurological: She is alert.  Skin: Skin is warm.  Psychiatric: She has a normal mood and affect.          Assessment & Plan:  GAD (generalized anxiety disorder) Doing well on Paxil however recently experiencing constipations secondary to Paxil.   Using Miralax daily with relief of constipation. Patient increased Trazadone to 150 mg and now is sleeping through the night. Needs refill of Trazadone.  PPD positive Patient will complete 12 weeks of TB therapy 2/25.  She will follow up with Dr. Drue Second in 2 weeks and will have a final blood draw to check her liver enzymes and (?) interferon TB gold.  Hypercholesteremia Patient has been compliant with Crestor without side effects. Here for lipid panel and refills.  Exercises 4-5 times a week for 1 hour. Avoids cholesterol rich foods. (Has seen nutrition in the past)  HTN (hypertension) Tolerating antihypertensive medication without side effects. Needs refills.  Denies CP, SOB, visual disturbance or focal deficits.  GERD (gastroesophageal reflux disease) Currently  with out symptoms

## 2012-01-30 NOTE — Assessment & Plan Note (Signed)
Patient will complete 12 weeks of TB therapy 2/25.  She will follow up with Dr. Drue Second in 2 weeks and will have a final blood draw to check her liver enzymes and (?) interferon TB gold.

## 2012-02-15 ENCOUNTER — Telehealth: Payer: Self-pay | Admitting: *Deleted

## 2012-02-15 NOTE — Telephone Encounter (Signed)
Patient called to say that she will be done with her Tb treatment this week and was told by provider to have labs done the following week. She just needs to get an appointment. I advised her will let the provider know find our what labs are needed and when she needs to come back to see the provider. I will give her a call as soon as the provider gets back to me on this.

## 2012-02-20 ENCOUNTER — Other Ambulatory Visit: Payer: Medicare Other

## 2012-02-20 ENCOUNTER — Other Ambulatory Visit: Payer: Self-pay | Admitting: Internal Medicine

## 2012-02-20 DIAGNOSIS — R7611 Nonspecific reaction to tuberculin skin test without active tuberculosis: Secondary | ICD-10-CM

## 2012-02-20 LAB — HEPATIC FUNCTION PANEL
ALT: 83 U/L — ABNORMAL HIGH (ref 0–35)
Albumin: 3.8 g/dL (ref 3.5–5.2)
Indirect Bilirubin: 0.4 mg/dL (ref 0.0–0.9)
Total Protein: 5.7 g/dL — ABNORMAL LOW (ref 6.0–8.3)

## 2012-03-13 ENCOUNTER — Telehealth: Payer: Self-pay | Admitting: *Deleted

## 2012-03-13 NOTE — Telephone Encounter (Signed)
Pt called asking what the results were for the lab she had drawn here at last OV. I gave her the results. She had additional questions & wanted Dr. Drue Second to call her  [phone]

## 2012-04-30 ENCOUNTER — Encounter: Payer: Self-pay | Admitting: Family Medicine

## 2012-04-30 ENCOUNTER — Ambulatory Visit (INDEPENDENT_AMBULATORY_CARE_PROVIDER_SITE_OTHER): Payer: Medicare Other | Admitting: Family Medicine

## 2012-04-30 VITALS — BP 120/75 | HR 79 | Temp 97.8°F | Resp 16 | Ht 62.0 in | Wt 130.0 lb

## 2012-04-30 DIAGNOSIS — K219 Gastro-esophageal reflux disease without esophagitis: Secondary | ICD-10-CM

## 2012-04-30 DIAGNOSIS — R7611 Nonspecific reaction to tuberculin skin test without active tuberculosis: Secondary | ICD-10-CM

## 2012-04-30 DIAGNOSIS — K739 Chronic hepatitis, unspecified: Secondary | ICD-10-CM

## 2012-04-30 DIAGNOSIS — F411 Generalized anxiety disorder: Secondary | ICD-10-CM

## 2012-04-30 DIAGNOSIS — I10 Essential (primary) hypertension: Secondary | ICD-10-CM

## 2012-04-30 DIAGNOSIS — E785 Hyperlipidemia, unspecified: Secondary | ICD-10-CM

## 2012-04-30 DIAGNOSIS — K759 Inflammatory liver disease, unspecified: Secondary | ICD-10-CM

## 2012-04-30 DIAGNOSIS — E7800 Pure hypercholesterolemia, unspecified: Secondary | ICD-10-CM

## 2012-04-30 LAB — COMPREHENSIVE METABOLIC PANEL
ALT: 52 U/L — ABNORMAL HIGH (ref 0–35)
AST: 58 U/L — ABNORMAL HIGH (ref 0–37)
Albumin: 3.9 g/dL (ref 3.5–5.2)
Alkaline Phosphatase: 55 U/L (ref 39–117)
BUN: 19 mg/dL (ref 6–23)
Calcium: 9.9 mg/dL (ref 8.4–10.5)
Chloride: 102 mEq/L (ref 96–112)
Potassium: 4 mEq/L (ref 3.5–5.3)
Sodium: 141 mEq/L (ref 135–145)

## 2012-04-30 MED ORDER — CLONAZEPAM 0.5 MG PO TABS: ORAL_TABLET | ORAL | Status: DC

## 2012-04-30 NOTE — Progress Notes (Signed)
Addended by: Dois Davenport on: 04/30/2012 10:03 AM   Modules accepted: Orders

## 2012-04-30 NOTE — Progress Notes (Addendum)
  Subjective:    Patient ID: Cheryl Kent, female    DOB: 12-Mar-1946, 66 y.o.   MRN: [id number]  HPI  Patient presents in routine follow up  Positive PPD- completed 12 weeks of once weekly INH and Rifampin; early on with INH hepatitis however                          when switched to once weekly therapy liver enzymes improved dramatically.  GAD- patient weaned herself off Paxil secondary to problems with constipation. Would like to           try prn alprazolam.  Dyslipidemia- compliant with Crestor without side effects.  GERD- occasionally symptomatic; "night eater; tried to quit and I just can't"; sleeps on wedge  Skin check- history of extensive sum damage  Constipation- chronic; Miralax and high fiber diet daily   Health Maintenance  Colonoscopy- 2013(Dr. Loreta Ave); follow up 10 years Mammogram- 07/2011 Pap- 2011(normal) Tdap- 2009   Review of Systems     Objective:   Physical Exam  Constitutional: She appears well-developed.  Neck: Neck supple. No thyromegaly present.  Cardiovascular: Normal rate, regular rhythm and normal heart sounds.   Pulmonary/Chest: Effort normal and breath sounds normal.  Abdominal: Soft. Bowel sounds are normal. She exhibits mass. There is no hepatosplenomegaly. There is no tenderness.  Neurological: She is alert.  Skin: Skin is warm. Rash: extensive sum damage.        Assessment & Plan:   1. Hypercholesteremia    2. HTN (hypertension)    3. GERD (gastroesophageal reflux disease)    4. GAD (generalized anxiety disorder)  clonazePAM (KLONOPIN) 0.5 MG tablet  5. INH hepatitis  Comprehensive metabolic panel  6. Positive PPD, treated       Continue current medications Follow up 3 months

## 2012-07-10 ENCOUNTER — Other Ambulatory Visit: Payer: Self-pay | Admitting: Family Medicine

## 2012-07-30 ENCOUNTER — Telehealth: Payer: Self-pay | Admitting: Radiology

## 2012-07-30 MED ORDER — TRAMADOL HCL 50 MG PO TABS: 50.0000 mg | ORAL_TABLET | Freq: Two times a day (BID) | ORAL | Status: DC | PRN

## 2012-07-30 NOTE — Telephone Encounter (Signed)
Renewal for patients tramadol sent in, pt will need office visit for next.

## 2012-08-01 ENCOUNTER — Ambulatory Visit (INDEPENDENT_AMBULATORY_CARE_PROVIDER_SITE_OTHER): Payer: Medicare Other | Admitting: Physician Assistant

## 2012-08-01 VITALS — BP 120/84 | HR 83 | Temp 98.0°F | Resp 18 | Ht 61.5 in | Wt 130.0 lb

## 2012-08-01 DIAGNOSIS — E785 Hyperlipidemia, unspecified: Secondary | ICD-10-CM

## 2012-08-01 DIAGNOSIS — E7800 Pure hypercholesterolemia, unspecified: Secondary | ICD-10-CM

## 2012-08-01 DIAGNOSIS — I10 Essential (primary) hypertension: Secondary | ICD-10-CM

## 2012-08-01 DIAGNOSIS — F411 Generalized anxiety disorder: Secondary | ICD-10-CM

## 2012-08-01 LAB — LIPID PANEL
Cholesterol: 189 mg/dL (ref 0–200)
HDL: 76 mg/dL (ref >39)
Total CHOL/HDL Ratio: 2.5 Ratio
VLDL: 13 mg/dL (ref 0–40)

## 2012-08-01 LAB — COMPREHENSIVE METABOLIC PANEL
AST: 35 U/L (ref 0–37)
BUN: 17 mg/dL (ref 6–23)
Calcium: 9.5 mg/dL (ref 8.4–10.5)
Chloride: 104 mEq/L (ref 96–112)
Creat: 0.83 mg/dL (ref 0.50–1.10)
Total Bilirubin: 0.6 mg/dL (ref 0.3–1.2)

## 2012-08-01 MED ORDER — LISINOPRIL-HYDROCHLOROTHIAZIDE 20-25 MG PO TABS: 1.0000 | ORAL_TABLET | Freq: Every day | ORAL | Status: DC

## 2012-08-01 MED ORDER — TRAMADOL HCL 50 MG PO TABS: 50.0000 mg | ORAL_TABLET | Freq: Two times a day (BID) | ORAL | Status: DC | PRN

## 2012-08-01 MED ORDER — ROSUVASTATIN CALCIUM 10 MG PO TABS: 10.0000 mg | ORAL_TABLET | Freq: Every day | ORAL | Status: DC

## 2012-08-01 MED ORDER — CLONAZEPAM 0.5 MG PO TABS: 0.5000 mg | ORAL_TABLET | Freq: Once | ORAL | Status: DC

## 2012-08-01 MED ORDER — TRAZODONE HCL 150 MG PO TABS: 150.0000 mg | ORAL_TABLET | Freq: Every day | ORAL | Status: DC

## 2012-08-02 ENCOUNTER — Encounter: Payer: Self-pay | Admitting: Physician Assistant

## 2012-08-02 NOTE — Progress Notes (Signed)
  Subjective:    Patient ID: Cheryl Kent, female    DOB: 1946/01/07, 66 y.o.   MRN: [id number]  HPI 66 yo CF previously cared for by Dr. Hal Hope. She is ok on the meds she is taking now.  She has a sick cat and is having a lot of anxiety right now.  She is unsure how much the clonazepam is working for her.  She takes 1 daily.  Previously she was on cymbalta and thinks she did pretty well with for her anxiety and her pain.  She is very active "almost addicted to exercise."  She went to [employer] when she was 13 and has been sober since.  Review of Systems  All other systems reviewed and are negative.      Objective:   Physical Exam  Nursing note and vitals reviewed. Constitutional: She is oriented to person, place, and time. She appears well-developed and well-nourished.  HENT:  Head: Normocephalic and atraumatic.  Cardiovascular: Normal rate, regular rhythm and normal heart sounds.   Pulmonary/Chest: Effort normal and breath sounds normal.  Neurological: She is alert and oriented to person, place, and time.  Skin: Skin is warm and dry.  Psychiatric: She has a normal mood and affect. Her behavior is normal. Judgment and thought content normal.          Assessment & Plan:  Hypertension-stable Arthritic pain-tramadol 1-2 time daily is fine.  Anxiety-she doesn't want to make any changes at this time.  She may come back in a couple of months and [place] try cymbalta and try weaning off of the clonazepam.  She will think about it.  Otherwise, I will see her back in 6 months. Hyperlipidemia-continue crestor. Ok to fill all meds at current doses X 6 months.

## 2012-09-08 ENCOUNTER — Ambulatory Visit (INDEPENDENT_AMBULATORY_CARE_PROVIDER_SITE_OTHER): Payer: Medicare Other | Admitting: Internal Medicine

## 2012-09-08 VITALS — BP 98/58 | HR 85 | Temp 98.6°F | Resp 16 | Ht 61.25 in | Wt 130.8 lb

## 2012-09-08 DIAGNOSIS — R509 Fever, unspecified: Secondary | ICD-10-CM

## 2012-09-08 NOTE — Progress Notes (Signed)
  Subjective:    Patient ID: Cheryl Kent, female    DOB: 01/01/1946, 66 y.o.   MRN: [id number]  HPIfever this am 102 Associated with headache and nausea but no vomiting Took one Advil now free of fever No other GI or GU symptoms No cough cold symptoms or shortness of breath No skin rash  Patient Active Problem List  Diagnosis  . SHOULDER PAIN, RIGHT  . BICEPS TENDINITIS  . Hypercholesteremia  . HTN (hypertension)  . GERD (gastroesophageal reflux disease)  . GAD (generalized anxiety disorder)  . PPD positive--Recently completed 3 months of INH    Review of Systems No other symptoms of systemic illness    Objective:   Physical Exam Vital signs within normal limits No acute distress HEENT clear Heart regular without murmur Lungs clear to auscultation Skin clear Extremities no rashes or edema       Assessment & Plan:  Problem #1 fever likely secondary to viral infection Followup as needed for further symptoms

## 2012-10-26 ENCOUNTER — Other Ambulatory Visit: Payer: Self-pay | Admitting: Physician Assistant

## 2012-11-07 ENCOUNTER — Encounter: Payer: Self-pay | Admitting: Physician Assistant

## 2012-11-07 ENCOUNTER — Ambulatory Visit (INDEPENDENT_AMBULATORY_CARE_PROVIDER_SITE_OTHER): Payer: Medicare Other | Admitting: Physician Assistant

## 2012-11-07 VITALS — BP 106/68 | HR 75 | Temp 97.9°F | Resp 18 | Wt 131.0 lb

## 2012-11-07 DIAGNOSIS — R7989 Other specified abnormal findings of blood chemistry: Secondary | ICD-10-CM

## 2012-11-07 DIAGNOSIS — R509 Fever, unspecified: Secondary | ICD-10-CM

## 2012-11-07 DIAGNOSIS — M810 Age-related osteoporosis without current pathological fracture: Secondary | ICD-10-CM

## 2012-11-07 DIAGNOSIS — M461 Sacroiliitis, not elsewhere classified: Secondary | ICD-10-CM

## 2012-11-07 LAB — CBC WITH DIFFERENTIAL/PLATELET
Basophils Absolute: 0.1 10*3/uL (ref 0.0–0.1)
Basophils Relative: 1 % (ref 0–1)
Eosinophils Absolute: 0.2 10*3/uL (ref 0.0–0.7)
Eosinophils Relative: 2 % (ref 0–5)
MCH: 32.9 pg (ref 26.0–34.0)
MCV: 96 fL (ref 78.0–100.0)
Neutrophils Relative %: 52 % (ref 43–77)
Platelets: 296 10*3/uL (ref 150–400)
RDW: 12.4 % (ref 11.5–15.5)

## 2012-11-07 MED ORDER — METAXALONE 800 MG PO TABS: 800.0000 mg | ORAL_TABLET | Freq: Three times a day (TID) | ORAL | Status: DC

## 2012-11-07 MED ORDER — CLONAZEPAM 0.5 MG PO TABS: 0.5000 mg | ORAL_TABLET | Freq: Once | ORAL | Status: DC

## 2012-11-07 NOTE — Progress Notes (Signed)
  Subjective:    Patient ID: Cheryl Kent, female    DOB: 16-Aug-1946, 66 y.o.   MRN: [id number]  HPI fevers about 2X/year. Usu til about 101 and last 2 days without other symptoms. She hasn't had this recently.  The fevers have occurred a couple of times a year for the last 3-4 years.  She has seen physicians for this and had testing that revealed no abnormalities. She has not had weight loss or night sweats.  No cough.  She has weaned herself down significantly on her clonazepam. She is only taking it a couple of times per week now.  She also has occasional pain in her R hip/SI joint.  She saw an orthopedist about it and they xrayed it and put her on celebrex.  The pain is unchanged, but she only took 1 celebrex. She is very active and exercises regularly. NKI  Review of Systems  All other systems reviewed and are negative.       Objective:   Physical Exam  Vitals reviewed. Constitutional: She is oriented to person, place, and time. She appears well-developed and well-nourished.  HENT:  Head: Normocephalic and atraumatic.  Neck: Normal range of motion.       No supraclavicular nodes.  Cardiovascular: Normal rate, regular rhythm and normal heart sounds.   Pulmonary/Chest: Effort normal and breath sounds normal.  Musculoskeletal: Normal range of motion.       Normal ROM in R hip and joint is stable.  Neurological: She is alert and oriented to person, place, and time.  Skin: Skin is warm and dry.  Psychiatric: She has a normal mood and affect. Her behavior is normal. Judgment and thought content normal.       Assessment & Plan:  Fevers-by history and with no other constitutional symptoms, and for as long as she has been having them aren't concerning at this point.  Hip/S-I joint pain-follow ortho orders, start celebrex and add skelaxin X 10 days then f/up with ortho if not improving. Anxiety-stable on meds. OK to RF all meds including # 30 clonazepam each month.

## 2012-12-23 ENCOUNTER — Other Ambulatory Visit: Payer: Self-pay | Admitting: Physician Assistant

## 2012-12-23 MED ORDER — ALENDRONATE SODIUM 70 MG PO TABS: 70.0000 mg | ORAL_TABLET | ORAL | Status: DC

## 2012-12-23 NOTE — Progress Notes (Signed)
i spoke with the patient on the phone.  After reviewing her dexa scan results, I recommended fosamax and sent prescription.  At this point, she would like to increase her calcium intake to 1200mg  daily and vit D to 2000units daily and forego the fosamax.  She will continue weight bearing exercises.

## 2013-01-09 ENCOUNTER — Encounter: Payer: Self-pay | Admitting: Physician Assistant

## 2013-01-23 ENCOUNTER — Ambulatory Visit (INDEPENDENT_AMBULATORY_CARE_PROVIDER_SITE_OTHER): Payer: Medicare Other | Admitting: Physician Assistant

## 2013-01-23 ENCOUNTER — Encounter: Payer: Self-pay | Admitting: Physician Assistant

## 2013-01-23 VITALS — BP 116/64 | HR 77 | Temp 98.7°F | Resp 16 | Ht 61.0 in | Wt 131.4 lb

## 2013-01-23 DIAGNOSIS — I10 Essential (primary) hypertension: Secondary | ICD-10-CM

## 2013-01-23 DIAGNOSIS — K623 Rectal prolapse: Secondary | ICD-10-CM

## 2013-01-23 DIAGNOSIS — E7800 Pure hypercholesterolemia, unspecified: Secondary | ICD-10-CM

## 2013-01-23 DIAGNOSIS — F411 Generalized anxiety disorder: Secondary | ICD-10-CM

## 2013-01-23 LAB — COMPREHENSIVE METABOLIC PANEL
AST: 29 U/L (ref 0–37)
Alkaline Phosphatase: 46 U/L (ref 39–117)
BUN: 17 mg/dL (ref 6–23)
Calcium: 10 mg/dL (ref 8.4–10.5)
Chloride: 103 mEq/L (ref 96–112)
Creat: 0.87 mg/dL (ref 0.50–1.10)

## 2013-01-23 MED ORDER — MELOXICAM 15 MG PO TABS: 15.0000 mg | ORAL_TABLET | Freq: Every day | ORAL | Status: DC

## 2013-01-23 MED ORDER — ROSUVASTATIN CALCIUM 10 MG PO TABS: 10.0000 mg | ORAL_TABLET | Freq: Every day | ORAL | Status: DC

## 2013-01-23 MED ORDER — LISINOPRIL-HYDROCHLOROTHIAZIDE 20-25 MG PO TABS: 1.0000 | ORAL_TABLET | Freq: Every day | ORAL | Status: DC

## 2013-01-23 MED ORDER — CLONAZEPAM 0.5 MG PO TABS: 0.5000 mg | ORAL_TABLET | Freq: Once | ORAL | Status: DC

## 2013-01-24 ENCOUNTER — Encounter: Payer: Self-pay | Admitting: *Deleted

## 2013-01-24 LAB — CBC WITH DIFFERENTIAL/PLATELET
Basophils Absolute: 0.1 10*3/uL (ref 0.0–0.1)
Basophils Relative: 1 % (ref 0–1)
Eosinophils Relative: 1 % (ref 0–5)
HCT: 43.2 % (ref 36.0–46.0)
MCH: 32.7 pg (ref 26.0–34.0)
MCHC: 34 g/dL (ref 30.0–36.0)
MCV: 96.2 fL (ref 78.0–100.0)
Monocytes Absolute: 0.6 10*3/uL (ref 0.1–1.0)
RDW: 12.2 % (ref 11.5–15.5)

## 2013-01-24 NOTE — Progress Notes (Signed)
  Subjective:    Patient ID: Cheryl Kent, female    DOB: Jul 04, 1946, 67 y.o.   MRN: [id number]  HPI 67 yr old CF presents for check up for BP and cholesterol.  The pain she has with her R hip is helped by occasional tramadol(1 prescription lasts 2-3 months).  celebrex has worked but it is expensive. She hasn't tried meloxicam.  Clonazepam she takes only rarely. 1 prescription lasts 2-3 months. She continues to exercise daily and eat a healthy diet.  She has noticed some improvement in her rectal prolapse.  She has gone from having constipation for now having loose stools.  She has had 2 colonoscopies within the last year that revealed only a benign polyp. They did it twice bc neither time did she experience a full cleaning out.   Review of Systems  All other systems reviewed and are negative.       Objective:   Physical Exam  Nursing note and vitals reviewed. Constitutional: She is oriented to person, place, and time. She appears well-developed and well-nourished.  HENT:  Head: Normocephalic and atraumatic.  Right Ear: External ear normal.  Left Ear: External ear normal.  Neck: Normal range of motion. Neck supple. No thyromegaly present.  Cardiovascular: Normal rate, regular rhythm and normal heart sounds.   Pulmonary/Chest: Effort normal and breath sounds normal.  Lymphadenopathy:    She has no cervical adenopathy.  Neurological: She is alert and oriented to person, place, and time.  Skin: Skin is warm and dry.  Psychiatric: She has a normal mood and affect. Her behavior is normal. Thought content normal.       Assessment & Plan:  Hypertension-stable. Continue current plan. High cholesterol-checking labs Sciatica-she can try the meloxicam and see if it works as well as the celebrex.  Anxiety-I really think Cheryl Kent would benefit from being on an SSRI, but she is resistant to this. She can continue to use the clonazepam prn.  It can be filled once every 3 months. Dr. Audria Nine  also saw and examined this patient.

## 2013-01-30 ENCOUNTER — Ambulatory Visit: Payer: Medicare Other | Admitting: Physician Assistant

## 2013-02-06 ENCOUNTER — Telehealth: Payer: Self-pay

## 2013-02-06 LAB — HM MAMMOGRAPHY: HM Mammogram: NORMAL

## 2013-02-06 NOTE — Telephone Encounter (Signed)
WANTS DR PHERSON TO KNOW THAT SHE HAD A 3D MAMMO DONE AND THAT WE SHOULD BE GETTING THE RESULTS HERE.

## 2013-03-06 ENCOUNTER — Encounter: Payer: Self-pay | Admitting: Radiology

## 2013-05-07 ENCOUNTER — Telehealth: Payer: Self-pay

## 2013-05-07 NOTE — Telephone Encounter (Signed)
Does she even still take this? Not prescribed since 07/2012 - looks like she has seen Ortho. What is the follow up plan?

## 2013-05-07 NOTE — Telephone Encounter (Signed)
Please advise 

## 2013-05-07 NOTE — Telephone Encounter (Signed)
Patient would like refill on her tramadol [facility] [address] [facility] do not have it on file patients number is [phone]

## 2013-05-08 MED ORDER — TRAMADOL HCL 50 MG PO TABS: 50.0000 mg | ORAL_TABLET | Freq: Two times a day (BID) | ORAL | Status: DC | PRN

## 2013-05-08 NOTE — Telephone Encounter (Signed)
Yes she uses this prn, she takes for her arthritis in her spine (back and neck) please advise. She states she has an appt in Aug to follow up

## 2013-05-08 NOTE — Telephone Encounter (Signed)
Sent.  Needs follow up for further refills

## 2013-05-08 NOTE — Telephone Encounter (Signed)
Thanks she was advised.

## 2013-05-29 ENCOUNTER — Ambulatory Visit (INDEPENDENT_AMBULATORY_CARE_PROVIDER_SITE_OTHER): Payer: Medicare Other | Admitting: Family Medicine

## 2013-05-29 ENCOUNTER — Ambulatory Visit: Payer: Medicare Other

## 2013-05-29 VITALS — BP 118/80 | HR 90 | Temp 98.2°F | Resp 16 | Ht 61.0 in | Wt 134.0 lb

## 2013-05-29 DIAGNOSIS — S0003XA Contusion of scalp, initial encounter: Secondary | ICD-10-CM | POA: Insufficient documentation

## 2013-05-29 DIAGNOSIS — M25572 Pain in left ankle and joints of left foot: Secondary | ICD-10-CM

## 2013-05-29 DIAGNOSIS — S1093XA Contusion of unspecified part of neck, initial encounter: Secondary | ICD-10-CM

## 2013-05-29 DIAGNOSIS — M25579 Pain in unspecified ankle and joints of unspecified foot: Secondary | ICD-10-CM

## 2013-05-29 DIAGNOSIS — S93409A Sprain of unspecified ligament of unspecified ankle, initial encounter: Secondary | ICD-10-CM | POA: Insufficient documentation

## 2013-05-29 NOTE — Patient Instructions (Addendum)
Thank you for coming in today. Come back or go to the emergency room if you notice new weakness new numbness problems walking or bowel or bladder problems.  Take Tylenol as needed for pain Come back as needed

## 2013-05-29 NOTE — Progress Notes (Signed)
Cheryl Kent is a 67 y.o. female who presents to [facility] today for fall and scalp contusion and left ankle pain. Patient is asleep and healthy 67 year old woman. She was on top of a step ladder tried to open a window today when she slipped and fell. She fell to the ground and hit her head partially.  She denies any loss of consciousness dizziness difficulty with concentration or coordination. She feels completely well. She is here just to get checked out. She does note that she has a contusion on the left side of her head but otherwise feels well. She does note a sore left ankle. She thinks she fell onto the ankle during a fall. She is not sure if she suffered an inversion injury. She denies any swelling. She denies any weakness numbness radiating pain fevers or chills.     PMH: Reviewed hypertension History  Substance Use Topics  . Smoking status: Former Smoker    Quit date: 12/20/1983  . Smokeless tobacco: Never Used  . Alcohol Use: Yes     Comment: former use quit in 1980 with treatment   ROS as above  Medications reviewed. Current Outpatient Prescriptions  Medication Sig Dispense Refill  . aspirin 81 MG tablet Take 81 mg by mouth daily.        . Calcium Carbonate-Vitamin D (CALCIUM + D PO) Take by mouth daily.      . Cholecalciferol (VITAMIN D) 2000 UNITS CAPS Take by mouth daily.      . clonazePAM (KLONOPIN) 0.5 MG tablet Take 1 tablet (0.5 mg total) by mouth once.  30 tablet  2  . Coenzyme Q10 (COQ10) 100 MG CAPS Take by mouth daily.      Marland Kitchen lisinopril-hydrochlorothiazide (PRINZIDE,ZESTORETIC) 20-25 MG per tablet Take 1 tablet by mouth daily.  90 tablet  3  . meloxicam (MOBIC) 15 MG tablet Take 1 tablet (15 mg total) by mouth daily.  90 tablet  1  . Misc Natural Products (DAILY HERBS IMMUNE DEFENSE PO) Take by mouth daily.      . Multiple Vitamin (MULTIVITAMIN) tablet Take 1 tablet by mouth daily.      . Multiple Vitamins-Minerals PACK Take by mouth daily.      . Omega-3 Fatty Acids  (FISH OIL) 1000 MG CAPS Take by mouth daily.      . polyethylene glycol powder (GLYCOLAX/MIRALAX) powder MIX 1 CAPFUL IN WATER AND DRINK EVERY DAY AS NEEDED  527 g  0  . rosuvastatin (CRESTOR) 10 MG tablet Take 1 tablet (10 mg total) by mouth daily.  30 tablet  11  . traMADol (ULTRAM) 50 MG tablet Take 1 tablet (50 mg total) by mouth every 12 (twelve) hours as needed.  90 tablet  0  . traZODone (DESYREL) 150 MG tablet Take 1 tablet (150 mg total) by mouth at bedtime.  90 tablet  3   No current facility-administered medications for this visit.    Exam:  BP 118/80  Pulse 90  Temp(Src) 98.2 F (36.8 C) (Oral)  Resp 16  Ht 5\' 1"  (1.549 m)  Wt 134 lb (60.782 kg)  BMI 25.33 kg/m2  SpO2 95% Gen: Well NAD HEENT: EOMI,  MMM, 1 cm scalp contusion on the left side of her head. PERRLA. Lungs: CTABL Nl WOB Heart: RRR no MRG Abd: NABS, NT, ND Exts: Non edematous BL  LE, warm and well perfused.  Neuro: Alert and oriented x3 cranial 2 through 12 are intact. Balance and coordination are intact. Sensation and  gait are normal.   No results found for this or any previous visit (from the past 72 hour(s)).  Assessment and Plan: 67 y.o. female with fall with scalp contusion. Patient has zero neurologic symptoms and is completely normal to exam today. She is not on any blood thinners. Her risk of serious intracranial pathology is extremely low. We discussed this with the patient. We did offer transfer to the emergency room for head CT but we felt this is low yield as did the patient.  Discussed warning signs or symptoms. Please see discharge instructions. Patient expresses understanding.

## 2013-07-24 ENCOUNTER — Encounter: Payer: Self-pay | Admitting: Family Medicine

## 2013-07-24 ENCOUNTER — Ambulatory Visit: Payer: Medicare Other | Admitting: Physician Assistant

## 2013-07-24 ENCOUNTER — Ambulatory Visit (INDEPENDENT_AMBULATORY_CARE_PROVIDER_SITE_OTHER): Payer: Medicare Other | Admitting: Family Medicine

## 2013-07-24 VITALS — BP 110/68 | HR 69 | Temp 98.8°F | Resp 16 | Ht 61.0 in | Wt 131.8 lb

## 2013-07-24 DIAGNOSIS — I10 Essential (primary) hypertension: Secondary | ICD-10-CM

## 2013-07-24 DIAGNOSIS — M8580 Other specified disorders of bone density and structure, unspecified site: Secondary | ICD-10-CM

## 2013-07-24 DIAGNOSIS — K3189 Other diseases of stomach and duodenum: Secondary | ICD-10-CM

## 2013-07-24 DIAGNOSIS — F411 Generalized anxiety disorder: Secondary | ICD-10-CM

## 2013-07-24 DIAGNOSIS — E785 Hyperlipidemia, unspecified: Secondary | ICD-10-CM

## 2013-07-24 DIAGNOSIS — M899 Disorder of bone, unspecified: Secondary | ICD-10-CM

## 2013-07-24 DIAGNOSIS — K30 Functional dyspepsia: Secondary | ICD-10-CM

## 2013-07-24 MED ORDER — TRAMADOL HCL 50 MG PO TABS: 50.0000 mg | ORAL_TABLET | Freq: Two times a day (BID) | ORAL | Status: DC | PRN

## 2013-07-24 MED ORDER — TRAZODONE HCL 150 MG PO TABS: 150.0000 mg | ORAL_TABLET | Freq: Every day | ORAL | Status: DC

## 2013-07-24 NOTE — Patient Instructions (Addendum)
I have given you some information about Gluten- sensitivity so that may help you improve your digestion and normalize your stools. Try the Probiotics and continue healthy lifestyle practices.

## 2013-07-24 NOTE — Progress Notes (Signed)
S: This 67 y.o. Cauc female is here for follow-up of HTN, dyslipidemia and other chronic medical issues. She presents some data from recent [facility] screening. She recounts a hx of alcoholism- in recovery for many years after successful treatment at [facility]. She wants to keep close watch on LFTs because of alcohol abuse hx and current statin medication. Recent lab results show normal AST and ALT. Pt has very strong hx of osteoporosis so we discussed most recent bone density results. She is not interested in taking a bisphosphonate but will continue w/ calcium and Vit D supplement. She maintains a vigorous exercise schedule ("I am addicted to exercise") and good nutrition. She has "loose stools" since colonoscopy x 2 last year (1st one not a good prep). Pt endorsed probable IBS; she is monitoring her diet for foods that may be contributing to this. She denies abd pain or hematochezia.   Patient Active Problem List   Diagnosis Date Noted  . PPD positive 11/24/2011  . Hypercholesteremia 11/23/2011  . HTN (hypertension) 11/23/2011  . GERD (gastroesophageal reflux disease) 11/23/2011  . GAD (generalized anxiety disorder) 11/23/2011  . SHOULDER PAIN, RIGHT 05/11/2010  . BICEPS TENDINITIS 05/11/2010    PMHx, Soc Hx and Fam Hx reviewed.  Medications reconciled.   ROS: As per HPI; negative for fatigue, diaphoresis, scleral icterus, enlarged lymph nodes, CP or tightness, palpitations, SOB or DOE, edema, jaundice, rashes, pruritis, HA, dizziness, weakness, tremor or syncope. Mentation is good w/o agitation, confusion or dysphoric mood. Pt sleeps well.  O: Filed Vitals:   07/24/13 1035  BP: 110/68  Pulse: 69  Temp: 98.8 F (37.1 C)  Resp: 16   GEN: In NAD; WN,WD HENT: [place]/AT; EOMI w/ clear conj/sclerae. EACs/nose/ oroph unremarkable. SKIN: W&D; no rashes, jaundice or pallor. COR: RRR. No edema. LUNGS: CTA. Normal resp rate or effort. MS: MAEs; no deformities, tenderness or  effusions. NEURO: A&O x 3; CNs intact. Nonfocal.   May 2014 Labs ([facility] screening): TChol= 190  LDL= 90  HDL= 85  TGs= 73  Gluc= 86                                                               C-Reactive Protein= 0.12   AST= 24  ALT= 24   Creatinine= 0.67                                                             10 -yr Heart Risk= 1%   A/P: HTN (hypertension)- Stable and well controlled. No medication changes at this time.  Osteopenia- Continue Vit D and Calcium supplement.  Other and unspecified hyperlipidemia  GAD (generalized anxiety disorder) - Plan: traZODone (DESYREL) 150 MG tablet  Indigestion- Try nutrition modifications and Probiotics.  Meds ordered this encounter  Medications  . traMADol (ULTRAM) 50 MG tablet    Sig: Take 1 tablet (50 mg total) by mouth every 12 (twelve) hours as needed.    Dispense:  100 tablet    Refill:  0  . traZODone (DESYREL) 150 MG tablet    Sig: Take 1  tablet (150 mg total) by mouth at bedtime.    Dispense:  90 tablet    Refill:  3

## 2013-08-11 ENCOUNTER — Other Ambulatory Visit: Payer: Self-pay | Admitting: Physician Assistant

## 2013-08-12 ENCOUNTER — Telehealth: Payer: Self-pay

## 2013-08-12 NOTE — Telephone Encounter (Signed)
Pt states that her refill for crestor was denied by [place]. Does not understand why as she was just seen by Dr. Audria Nine in August. Would like an explanation  [facility] [address]  Pt [phone]  Ok to leave a msg

## 2013-08-12 NOTE — Telephone Encounter (Signed)
She should have refills until Feb 2015 called her to advise left message.

## 2013-09-11 ENCOUNTER — Other Ambulatory Visit: Payer: Self-pay | Admitting: Physician Assistant

## 2013-09-16 ENCOUNTER — Other Ambulatory Visit: Payer: Self-pay | Admitting: Physician Assistant

## 2013-12-25 ENCOUNTER — Ambulatory Visit: Payer: Medicare Other

## 2013-12-25 ENCOUNTER — Ambulatory Visit (INDEPENDENT_AMBULATORY_CARE_PROVIDER_SITE_OTHER): Payer: Medicare Other | Admitting: Family Medicine

## 2013-12-25 VITALS — BP 120/68 | HR 77 | Temp 99.1°F | Resp 18 | Ht 61.0 in | Wt 132.0 lb

## 2013-12-25 DIAGNOSIS — R05 Cough: Secondary | ICD-10-CM

## 2013-12-25 DIAGNOSIS — R509 Fever, unspecified: Secondary | ICD-10-CM

## 2013-12-25 DIAGNOSIS — J111 Influenza due to unidentified influenza virus with other respiratory manifestations: Secondary | ICD-10-CM

## 2013-12-25 DIAGNOSIS — R059 Cough, unspecified: Secondary | ICD-10-CM

## 2013-12-25 LAB — POCT CBC
GRANULOCYTE PERCENT: 72.8 % (ref 37–80)
HCT, POC: 45.7 % (ref 37.7–47.9)
HEMOGLOBIN: 14.7 g/dL (ref 12.2–16.2)
Lymph, poc: 1 (ref 0.6–3.4)
MCH, POC: 32.7 pg — AB (ref 27–31.2)
MCHC: 32.2 g/dL (ref 31.8–35.4)
MCV: 101.7 fL — AB (ref 80–97)
MID (cbc): 0.5 (ref 0–0.9)
MPV: 7.3 fL (ref 0–99.8)
POC GRANULOCYTE: 4 (ref 2–6.9)
POC LYMPH PERCENT: 17.5 %L (ref 10–50)
POC MID %: 9.7 %M (ref 0–12)
Platelet Count, POC: 235 10*3/uL (ref 142–424)
RBC: 4.49 M/uL (ref 4.04–5.48)
RDW, POC: 12.8 %
WBC: 5.5 10*3/uL (ref 4.6–10.2)

## 2013-12-25 LAB — POCT INFLUENZA A/B
Influenza A, POC: NEGATIVE
Influenza B, POC: NEGATIVE

## 2013-12-25 MED ORDER — IPRATROPIUM BROMIDE 0.03 % NA SOLN: 2.0000 | Freq: Two times a day (BID) | NASAL | Status: DC

## 2013-12-25 MED ORDER — OSELTAMIVIR PHOSPHATE 75 MG PO CAPS: 75.0000 mg | ORAL_CAPSULE | Freq: Two times a day (BID) | ORAL | Status: DC

## 2013-12-25 NOTE — Patient Instructions (Signed)

## 2013-12-25 NOTE — Progress Notes (Signed)
Subjective:    Patient ID: Cheryl Kent, female    DOB: October 16, 1946, 68 y.o.   MRN: [id number]  This chart was scribed for Renette Butters. Tamala Julian, MD, by Sydell Axon, ED Scribe. This patient was seen in room 10 and the patient's care was started at 2:23 PM.  HPI Comments: Cheryl Kent is a 68 y.o. female who presents to the [facility] complaining of fever of 101-102 degrees farenheit that began two days ago with associated HA and cough. Patient explains that her symptoms began on Monday with her highest fever of 102 degrees occurring this morning. She explains that she has had an intermittent productive cough with recent greenish sputum and accompanying CP. She states she is slightly SOB while ambulating.Patient also states she has a sore throat and congestion. Patient denies any otalgia, nausea, vomiting, or diarrhea. Her sore throat has partially resolved.  Patient has taken Advil and Mucinex with minimal relief for her current symptoms.She has had her flu shot this year and has recently been near sick contacts.   Past Medical History  Diagnosis Date  . Arthritis   . Substance abuse   . Cataract   . Anxiety   . Tuberculosis 2012  . Allergy   . GERD (gastroesophageal reflux disease)   . Positive QuantiFERON-TB Gold test     Past Surgical History  Procedure Laterality Date  . Tonsillectomy and adenoidectomy      childhood  . Breast surgery      #1 age 12 yo  #2 age 30's  . Tubal ligation    . Shoulder surgery      Family History  Problem Relation Age of Onset  . Stroke Mother   . Hypertension Mother   . Hyperlipidemia Mother   . Osteoporosis Mother   . Alzheimer's disease Father   . Diabetes Father   . Hypertension Father   . Osteoporosis Sister   . Kidney disease Sister   . Stroke Sister   . Osteoporosis Maternal Grandmother   . Stroke Maternal Grandmother   . Osteoporosis Paternal Grandmother   . Alzheimer's disease Paternal Grandfather     History     Social History  . Marital Status: Widowed    Spouse Name: N/A    Number of Children: N/A  . Years of Education: N/A   Occupational History  . Not on file.   Social History Main Topics  . Smoking status: Former Smoker    Quit date: 12/20/1983  . Smokeless tobacco: Never Used  . Alcohol Use: No     Comment: former use quit in 1980 with treatment  . Drug Use: No  . Sexual Activity: No   Other Topics Concern  . Not on file   Social History Narrative   Exercise cardio,weights, and abs 5 x a week for 1 hour    Allergies  Allergen Reactions  . Penicillins Other (See Comments)    infant  . Pneumococcal Vaccines   . Sulfamethoxazole-Trimethoprim   . Sulfonamide Derivatives     Patient Active Problem List   Diagnosis Date Noted  . PPD positive 11/24/2011  . Hypercholesteremia 11/23/2011  . HTN (hypertension) 11/23/2011  . GERD (gastroesophageal reflux disease) 11/23/2011  . GAD (generalized anxiety disorder) 11/23/2011  . SHOULDER PAIN, RIGHT 05/11/2010  . BICEPS TENDINITIS 05/11/2010    Results for orders placed in visit on 03/06/13  HM MAMMOGRAPHY      Result Value Range   HM Mammogram normal  screening mammogram      No diagnosis found.  Current Outpatient Prescriptions on File Prior to Visit  Medication Sig Dispense Refill  . aspirin 81 MG tablet Take 81 mg by mouth daily.        . Calcium Carbonate-Vitamin D (CALCIUM + D PO) Take by mouth daily.      . Cholecalciferol (VITAMIN D) 2000 UNITS CAPS Take by mouth daily.      . Coenzyme Q10 (COQ10) 100 MG CAPS Take by mouth daily.      Marland Kitchen lisinopril-hydrochlorothiazide (PRINZIDE,ZESTORETIC) 20-25 MG per tablet Take 1 tablet by mouth daily.  90 tablet  3  . Multiple Vitamin (MULTIVITAMIN) tablet Take 1 tablet by mouth daily.      . Omega-3 Fatty Acids (FISH OIL) 1000 MG CAPS Take by mouth daily.      . rosuvastatin (CRESTOR) 10 MG tablet Take 1 tablet (10 mg total) by mouth daily.  30 tablet  11  . traMADol  (ULTRAM) 50 MG tablet Take 1 tablet (50 mg total) by mouth every 12 (twelve) hours as needed.  100 tablet  0  . traZODone (DESYREL) 150 MG tablet Take 1 tablet (150 mg total) by mouth at bedtime.  90 tablet  3  . clonazePAM (KLONOPIN) 0.5 MG tablet Take 1 tablet (0.5 mg total) by mouth once.  30 tablet  2  . meloxicam (MOBIC) 15 MG tablet Take 1 tablet (15 mg total) by mouth daily.  90 tablet  1   No current facility-administered medications on file prior to visit.    BP 120/68  Pulse 77  Temp(Src) 99.1 F (37.3 C) (Oral)  Resp 18  Ht 5\' 1"  (1.549 m)  Wt 132 lb (59.875 kg)  BMI 24.95 kg/m2  SpO2 95%  HPI  Review of Systems  Constitutional: Positive for fever. Negative for chills.  HENT: Positive for congestion, sneezing and sore throat. Negative for ear pain and rhinorrhea.   Eyes: Negative.   Respiratory: Positive for cough, chest tightness and shortness of breath.   Gastrointestinal: Negative for nausea, vomiting and diarrhea.  Musculoskeletal: Negative.   Skin: Negative.   Neurological: Positive for headaches. Negative for dizziness, weakness and light-headedness.  Psychiatric/Behavioral: Negative.     Objective:   Physical Exam  Nursing note and vitals reviewed. Constitutional: She is oriented to person, place, and time. She appears well-developed and well-nourished. No distress.  HENT:  Head: Normocephalic and atraumatic.  Right Ear: Hearing normal. There is drainage.  Left Ear: Hearing normal. There is drainage.  Mouth/Throat: Posterior oropharyngeal erythema (diffuse) present. No oropharyngeal exudate.  Eyes: EOM are normal.  Neck: Normal range of motion. Neck supple. No tracheal deviation present.  Cardiovascular: Normal rate, regular rhythm and normal heart sounds.   No murmur heard. Pulmonary/Chest: Effort normal and breath sounds normal. No respiratory distress.  Musculoskeletal: Normal range of motion.  Lymphadenopathy:    She has no cervical adenopathy.    Neurological: She is alert and oriented to person, place, and time.  Skin: Skin is warm and dry.  Psychiatric: She has a normal mood and affect. Her behavior is normal.   [facility] reading (PRIMARY) by  Dr. Tamala Julian. CXR: NAD  Results for orders placed in visit on 12/25/13  POCT INFLUENZA A/B      Result Value Range   Influenza A, POC Negative     Influenza B, POC Negative    POCT CBC      Result Value Range   WBC 5.5  4.6 - 10.2 K/uL   Lymph, poc 1.0  0.6 - 3.4   POC LYMPH PERCENT 17.5  10 - 50 %L   MID (cbc) 0.5  0 - 0.9   POC MID % 9.7  0 - 12 %M   POC Granulocyte 4.0  2 - 6.9   Granulocyte percent 72.8  37 - 80 %G   RBC 4.49  4.04 - 5.48 M/uL   Hemoglobin 14.7  12.2 - 16.2 g/dL   HCT, POC 45.7  37.7 - 47.9 %   MCV 101.7 (*) 80 - 97 fL   MCH, POC 32.7 (*) 27 - 31.2 pg   MCHC 32.2  31.8 - 35.4 g/dL   RDW, POC 12.8     Platelet Count, POC 235  142 - 424 K/uL   MPV 7.3  0 - 99.8 fL      Assessment & Plan:  Cough - Plan: POCT Influenza A/B, POCT CBC, DG Chest 2 View  Fever, unspecified - Plan: POCT Influenza A/B, POCT CBC, DG Chest 2 View  Influenza  1. Influenza:  New.  Rx for Tamiflu provided based on risk factor of age.  Recommend continued Mucinex and Advil; rx for Atrovent provided.   RTC for acute worsening or respiratory distress.  Meds ordered this encounter  Medications  . CLINDAMYCIN HCL PO    Sig: Take by mouth.  . oseltamivir (TAMIFLU) 75 MG capsule    Sig: Take 1 capsule (75 mg total) by mouth 2 (two) times daily.    Dispense:  10 capsule    Refill:  0  . ipratropium (ATROVENT) 0.03 % nasal spray    Sig: Place 2 sprays into the nose 2 (two) times daily.    Dispense:  30 mL    Refill:  0    I personally performed the services described in this documentation, which was scribed in my presence.  The recorded information has been reviewed and is accurate.  Reginia Forts, M.D.  [facility] [address] [place], [place]   [address] [phone] phone [phone] fax

## 2014-01-23 ENCOUNTER — Encounter: Payer: Medicare Other | Admitting: Family Medicine

## 2014-02-01 ENCOUNTER — Other Ambulatory Visit: Payer: Self-pay | Admitting: Physician Assistant

## 2014-02-11 ENCOUNTER — Encounter: Payer: Medicare Other | Admitting: Family Medicine

## 2014-02-28 ENCOUNTER — Ambulatory Visit (INDEPENDENT_AMBULATORY_CARE_PROVIDER_SITE_OTHER): Payer: Medicare Other | Admitting: Family Medicine

## 2014-02-28 ENCOUNTER — Encounter: Payer: Self-pay | Admitting: Family Medicine

## 2014-02-28 VITALS — BP 117/74 | HR 66 | Temp 98.8°F | Resp 16 | Ht 64.0 in | Wt 131.0 lb

## 2014-02-28 DIAGNOSIS — I10 Essential (primary) hypertension: Secondary | ICD-10-CM

## 2014-02-28 DIAGNOSIS — K3189 Other diseases of stomach and duodenum: Secondary | ICD-10-CM

## 2014-02-28 DIAGNOSIS — E7800 Pure hypercholesterolemia, unspecified: Secondary | ICD-10-CM

## 2014-02-28 DIAGNOSIS — R1013 Epigastric pain: Secondary | ICD-10-CM

## 2014-02-28 DIAGNOSIS — K623 Rectal prolapse: Secondary | ICD-10-CM

## 2014-02-28 DIAGNOSIS — Z0000 Encounter for general adult medical examination without abnormal findings: Secondary | ICD-10-CM

## 2014-02-28 DIAGNOSIS — Z1159 Encounter for screening for other viral diseases: Secondary | ICD-10-CM

## 2014-02-28 DIAGNOSIS — Z139 Encounter for screening, unspecified: Secondary | ICD-10-CM

## 2014-02-28 LAB — POCT URINALYSIS DIPSTICK
BILIRUBIN UA: NEGATIVE
Blood, UA: NEGATIVE
GLUCOSE UA: NEGATIVE
KETONES UA: NEGATIVE
LEUKOCYTES UA: NEGATIVE
Nitrite, UA: NEGATIVE
PROTEIN UA: NEGATIVE
Spec Grav, UA: 1.01
Urobilinogen, UA: 0.2
pH, UA: 7

## 2014-02-28 LAB — IFOBT (OCCULT BLOOD): IMMUNOLOGICAL FECAL OCCULT BLOOD TEST: NEGATIVE

## 2014-02-28 MED ORDER — POLYETHYLENE GLYCOL 3350 17 GM/SCOOP PO POWD: 17.0000 g | Freq: Every day | ORAL | Status: DC

## 2014-02-28 MED ORDER — LISINOPRIL-HYDROCHLOROTHIAZIDE 20-25 MG PO TABS: 1.0000 | ORAL_TABLET | Freq: Every day | ORAL | Status: DC

## 2014-02-28 MED ORDER — ROSUVASTATIN CALCIUM 10 MG PO TABS: 10.0000 mg | ORAL_TABLET | Freq: Every day | ORAL | Status: DC

## 2014-02-28 NOTE — Patient Instructions (Addendum)
Keeping You Healthy  Get These Tests  Blood Pressure- Have your blood pressure checked by your healthcare provider at least once a year.  Normal blood pressure is 120/80.  Weight- Have your body mass index (BMI) calculated to screen for obesity.  BMI is a measure of body fat based on height and weight.  You can calculate your own BMI at [employer].it  Cholesterol- Have your cholesterol checked every year.  Diabetes- Have your blood sugar checked every year if you have high blood pressure, high cholesterol, a family history of diabetes or if you are overweight.  Pap Smear- Have a pap smear every 1 to 3 years if you have been sexually active.  If you are older than 65 and recent pap smears have been normal you may not need additional pap smears.  In addition, if you have had a hysterectomy  For benign disease additional pap smears are not necessary.  Mammogram-Yearly mammograms are essential for early detection of breast cancer  Screening for Colon Cancer- Colonoscopy starting at age 67. Screening may begin sooner depending on your family history and other health conditions.  Follow up colonoscopy as directed by your Gastroenterologist.  Screening for Osteoporosis- Screening begins at age 90 with bone density scanning, sooner if you are at higher risk for developing Osteoporosis.  Get these medicines  Calcium with Vitamin D- Your body requires 1200-1500 mg of Calcium a day and [phone] IU of Vitamin D a day.  You can only absorb 500 mg of Calcium at a time therefore Calcium must be taken in 2 or 3 separate doses throughout the day.  Hormones- Hormone therapy has been associated with increased risk for certain cancers and heart disease.  Talk to your healthcare provider about if you need relief from menopausal symptoms.  Aspirin- Ask your healthcare provider about taking Aspirin to prevent Heart Disease and Stroke.  Get these Immuniztions  Flu shot- Every fall  Pneumonia  shot- Once after the age of 12; if you are younger ask your healthcare provider if you need a pneumonia shot- 2008.  Tetanus- Every ten years. Tdap- 2006  And Tetanus - 2012.  Zostavax- Once after the age of 45 to prevent shingles. You have had this vaccine.  Take these steps  Don't smoke- Your healthcare provider can help you quit. For tips on how to quit, ask your healthcare provider or go to www.smokefree.gov or call 1-800 QUIT-NOW.  Be physically active- Exercise 5 days a week for a minimum of 30 minutes.  If you are not already physically active, start slow and gradually work up to 30 minutes of moderate physical activity.  Try walking, dancing, bike riding, swimming, etc.  Eat a healthy diet- Eat a variety of healthy foods such as fruits, vegetables, whole grains, low fat milk, low fat cheeses, yogurt, lean meats, chicken, fish, eggs, dried beans, tofu, etc.  For more information go to www.thenutritionsource.org  Dental visit- Brush and floss teeth twice daily; visit your dentist twice a year.  Eye exam- Visit your Optometrist or Ophthalmologist yearly.  Drink alcohol in moderation- Limit alcohol intake to one drink or less a day.  Never drink and drive.  Depression- Your emotional health is as important as your physical health.  If you're feeling down or losing interest in things you normally enjoy, please talk to your healthcare provider.  Seat Belts- can save your life; always wear one  Smoke/Carbon Monoxide detectors- These detectors need to be installed on the appropriate level of your  home.  Replace batteries at least once a year.  Violence- If anyone is threatening or hurting you, please tell your healthcare provider.  Living Will/ Health care power of attorney- Discuss with your healthcare provider and family.    Indigestion Indigestion is discomfort in the upper abdomen that is caused by underlying problems such as gastroesophageal reflux disease (GERD), ulcers, or  gallbladder problems.  CAUSES  Indigestion can be caused by many things. Possible causes include:  Stomach acid in the esophagus.  Stomach infections, usually caused by the bacteria H. pylori.  Being overweight.  Hiatal hernia. This means part of the stomach pushes up through the diaphragm.  Overeating.  Emotional problems, such as stress, anxiety, or depression.  Poor nutrition.  Consuming too much alcohol, tobacco, or caffeine.  Consuming spicy foods, fats, peppermint, chocolate, tomato products, citrus, or fruit juices.  Medicines such as aspirin and other anti-inflammatory drugs, hormones, steroids, and thyroid medicines.  Gastroparesis. This is a condition in which the stomach does not empty properly.  Stomach cancer.  Pregnancy, due to an increase in hormone levels, a relaxation of muscles in the digestive tract, and pressure on the stomach from the growing fetus. SYMPTOMS   Uncomfortable feeling of fullness after eating.  Pain or burning sensation in the upper abdomen.  Bloating.  Belching and gas.  Nausea and vomiting.  Acidic taste in the mouth.  Burning sensation in the chest (heartburn). DIAGNOSIS  Your caregiver will review your medical history and perform a physical exam. Other tests, such as blood tests, stool tests, X-rays, and other imaging scans, may be done to check for more serious problems. TREATMENT  Liquid antacids and other drugs may be given to block stomach acid secretion. Medicines that increase esophageal muscle tone may also be given to help reduce symptoms. If an infection is found, antibiotic medicine may be given. HOME CARE INSTRUCTIONS  Avoid foods and drinks that make your symptoms worse, such as:  Caffeine or alcoholic drinks.  Chocolate.  Peppermint or mint flavorings.  Garlic and onions.  Spicy foods.  Citrus fruits, such as oranges, lemons, or limes.  Tomato-based foods such as sauce, chili, salsa, and  pizza.  Fried and fatty foods.  Avoid eating for the 3 hours prior to your bedtime.  Eat small, frequent meals instead of large meals.  Stop smoking if you smoke.  Maintain a healthy weight.  Wear loose-fitting clothing. Do not wear anything tight around your waist that causes pressure on your stomach.  Raise the head of your bed 4 to 8 inches with wood blocks to help you sleep. Extra pillows will not help.  Only take over-the-counter or prescription medicines as directed by your caregiver.  Do not take aspirin, ibuprofen, or other nonsteroidal anti-inflammatory drugs (NSAIDs). SEEK IMMEDIATE MEDICAL CARE IF:   You are not better after 2 days.  You have chest pressure or pain that radiates up into your neck, arms, back, jaw, or upper abdomen.  You have difficulty swallowing.  You keep vomiting.  You have black or bloody stools.  You have a fever.  You have dizziness, fainting, difficulty breathing, or heavy sweating.  You have severe abdominal pain.  You lose weight without trying. MAKE SURE YOU:  Understand these instructions.  Will watch your condition.  Will get help right away if you are not doing well or get worse. Document Released: 01/12/2005 Document Revised: 02/27/2012 Document Reviewed: 07/20/2011 [facility] Patient Information 2014 [place], [place].

## 2014-02-28 NOTE — Progress Notes (Signed)
Subjective:    Patient ID: Cheryl Kent, female    DOB: [birth year], 68 y.o.   MRN: [id number]  HPI  This 68 y.o. 63 female is her for Subsequent MCR CPE. She has well controlled HTN and lipid disorder and is compliant w/ all medications w/o adverse effects. Pt has mild rectal prolapse which has been eval by Dr. Johney [place] at [facility]. Pt opts not to have surgery and pain is mild w/ no active bleeding.  She has intermittent indigestion relieved w/ OTC Zantac. Bad habit- bedtime eating.  Pt exercises and maintains good nutrition.  HCM: Vision- annually; has cataracts.            Dental- Current; pt has partial.            CRS- Current (every 10 years).            MMG- Current- pt will schedule.             IMM- Current.           Patient Active Problem List   Diagnosis Date Noted  . PPD positive 11/24/2011  . Hypercholesteremia 11/23/2011  . HTN (hypertension) 11/23/2011  . GERD (gastroesophageal reflux disease) 11/23/2011  . GAD (generalized anxiety disorder) 11/23/2011  . SHOULDER PAIN, RIGHT 05/11/2010  . BICEPS TENDINITIS 05/11/2010    Prior to Admission medications   Medication Sig Start Date End Date Taking? Authorizing Provider  Calcium Carbonate-Vitamin D (CALCIUM + D PO) Take by mouth daily.   Yes Historical Provider, MD  Cholecalciferol (VITAMIN D) 2000 UNITS CAPS Take by mouth daily.   Yes Historical Provider, MD  clonazePAM (KLONOPIN) 0.5 MG tablet Take 1 tablet (0.5 mg total) by mouth once. 01/23/13  Yes Dionne Bucy McClung, PA-C  Coenzyme Q10 (COQ10) 100 MG CAPS Take by mouth daily.   Yes Historical Provider, MD  lisinopril-hydrochlorothiazide (PRINZIDE,ZESTORETIC) 20-25 MG per tablet Take 1 tablet by mouth daily. 01/23/13  Yes Argentina Donovan, PA-C  meloxicam (MOBIC) 15 MG tablet Take 1 tablet (15 mg total) by mouth daily. 01/23/13  Yes Argentina Donovan, PA-C  Multiple Vitamin (MULTIVITAMIN) tablet Take 1 tablet by mouth daily.   Yes Historical Provider, MD    Omega-3 Fatty Acids (FISH OIL) 1000 MG CAPS Take by mouth daily.   Yes Historical Provider, MD  ranitidine (ZANTAC) 150 MG tablet Take 150 mg by mouth at bedtime.   Yes Historical Provider, MD  rosuvastatin (CRESTOR) 10 MG tablet Take 1 tablet (10 mg total) by mouth daily. PATIENT NEEDS OFFICE VISIT FOR ADDITIONAL REFILLS   Yes Barton Fanny, MD  traMADol (ULTRAM) 50 MG tablet Take 1 tablet (50 mg total) by mouth every 12 (twelve) hours as needed. 07/24/13  Yes Barton Fanny, MD  traZODone (DESYREL) 150 MG tablet Take 1 tablet (150 mg total) by mouth at bedtime. 07/24/13  Yes Barton Fanny, MD  aspirin 81 MG tablet Take 81 mg by mouth daily.      Historical Provider, MD    PMHx, Surg Hx, Soc and Fam Hx reviewed.    Review of Systems  Constitutional: Negative.   HENT: Negative.   Eyes: Positive for visual disturbance.       Wears corrective lenses and has cataracts.  Respiratory: Negative.   Cardiovascular: Negative.   Gastrointestinal: Positive for rectal pain.       Chronic mild rectal prolapse- pain is "2" out of 5 most days.  Endocrine: Negative.   Genitourinary:  Negative.   Musculoskeletal: Negative.   Skin: Negative.   Neurological: Negative.   Hematological: Negative.   Psychiatric/Behavioral: Negative.       Objective:   Physical Exam  Nursing note and vitals reviewed. Constitutional: She is oriented to person, place, and time. Vital signs are normal. She appears well-developed and well-nourished.  HENT:  Head: Normocephalic and atraumatic.  Right Ear: Hearing, tympanic membrane, external ear and ear canal normal.  Left Ear: Hearing, tympanic membrane, external ear and ear canal normal.  Nose: Nose normal. No mucosal edema, nasal deformity or septal deviation.  Mouth/Throat: Uvula is midline, oropharynx is clear and moist and mucous membranes are normal. No oral lesions. Normal dentition.  Eyes: Conjunctivae, EOM and lids are normal. Pupils are equal,  round, and reactive to light. No scleral icterus.  Early cataracts bilaterally.  Neck: Trachea normal, normal range of motion and full passive range of motion without pain. Neck supple. No JVD present. No spinous process tenderness and no muscular tenderness present. Carotid bruit is not present. No mass and no thyromegaly present.  Cardiovascular: Normal rate, regular rhythm, S1 normal, S2 normal, normal heart sounds, intact distal pulses and normal pulses.   No extrasystoles are present. PMI is not displaced.  Exam reveals no gallop and no friction rub.   No murmur heard. Pulmonary/Chest: Effort normal and breath sounds normal. No respiratory distress. She has no decreased breath sounds. She has no wheezes. She has no rales.  Abdominal: Soft. Normal appearance and bowel sounds are normal. She exhibits no distension, no abdominal bruit, no pulsatile midline mass and no mass. There is no hepatosplenomegaly. There is no tenderness. There is no guarding and no CVA tenderness.  Genitourinary: Rectal exam shows anal tone abnormal. Rectal exam shows no external hemorrhoid, no fissure, no mass and no tenderness. No breast swelling, tenderness, discharge or bleeding. There is no rash, tenderness or lesion on the right labia. There is no rash, tenderness or lesion on the left labia.  Mild rectal prolapse noted w/ decreased sphincter tone.  Musculoskeletal: Normal range of motion. She exhibits no edema and no tenderness.       Cervical back: Normal.       Thoracic back: Normal.       Lumbar back: Normal.  Lymphadenopathy:       Head (right side): No submandibular, no tonsillar, no posterior auricular and no occipital adenopathy present.       Head (left side): No submandibular, no tonsillar, no posterior auricular and no occipital adenopathy present.    She has no cervical adenopathy.    She has no axillary adenopathy.       Right: No inguinal and no supraclavicular adenopathy present.       Left: No  inguinal and no supraclavicular adenopathy present.  Neurological: She is alert and oriented to person, place, and time. She has normal strength and normal reflexes. She displays no atrophy and no tremor. No cranial nerve deficit or sensory deficit. She exhibits normal muscle tone. She displays a negative Romberg sign. Coordination and gait normal. She displays no Babinski's sign on the right side. She displays no Babinski's sign on the left side.  Skin: Skin is warm, dry and intact. No bruising and no rash noted. She is not diaphoretic. No cyanosis or erythema. No pallor. Nails show no clubbing.  Psychiatric: Her speech is normal and behavior is normal. Judgment and thought content normal. Her mood appears anxious. Her affect is not labile and not inappropriate.  Cognition and memory are normal. She does not exhibit a depressed mood.    Results for orders placed in visit on 02/28/14  POCT URINALYSIS DIPSTICK      Result Value Ref Range   Color, UA yellow     Clarity, UA clear     Glucose, UA neg     Bilirubin, UA neg     Ketones, UA neg     Spec Grav, UA 1.010     Blood, UA neg     pH, UA 7.0     Protein, UA neg     Urobilinogen, UA 0.2     Nitrite, UA neg     Leukocytes, UA Negative    IFOBT (OCCULT BLOOD)      Result Value Ref Range   IFOBT Negative        Assessment & Plan:  Medicare annual wellness visit, subsequent - Plan: POCT urinalysis dipstick, IFOBT POC (occult bld, rslt in office), Thyroid Panel With TSH, COMPLETE METABOLIC PANEL WITH GFR  Rectal prolapse - Plan: IFOBT POC (occult bld, rslt in office)  Hypercholesteremia - Plan: Lipid panel  HTN (hypertension) - Stable on current medication; continue same. Plan: COMPLETE METABOLIC PANEL WITH GFR, lisinopril-hydrochlorothiazide (PRINZIDE,ZESTORETIC) 20-25 MG per tablet  Dyspepsia-  Adequately treated on OTC Zantac per pt preference.  Need for hepatitis C screening test - Plan: Hepatitis C antibody  Meds ordered this  encounter  Medications  . ranitidine (ZANTAC) 150 MG tablet    Sig: Take 150 mg by mouth at bedtime.  Marland Kitchen lisinopril-hydrochlorothiazide (PRINZIDE,ZESTORETIC) 20-25 MG per tablet    Sig: Take 1 tablet by mouth daily.    Dispense:  90 tablet    Refill:  3            . rosuvastatin (CRESTOR) 10 MG tablet    Sig: Take 1 tablet (10 mg total) by mouth daily.    Dispense:  30 tablet    Refill:  11  . polyethylene glycol powder (GLYCOLAX/MIRALAX) powder    Sig: Take 17 g by mouth daily. Use as needed for constipation    Dispense:  850 g    Refill:  3

## 2014-03-01 LAB — LIPID PANEL
Cholesterol: 196 mg/dL (ref 0–200)
HDL: 93 mg/dL (ref >39)
LDL CALC: 94 mg/dL (ref 0–99)
TRIGLYCERIDES: 47 mg/dL (ref <150)
Total CHOL/HDL Ratio: 2.1 Ratio
VLDL: 9 mg/dL (ref 0–40)

## 2014-03-01 LAB — THYROID PANEL WITH TSH
Free Thyroxine Index: 2.4 (ref 1.0–3.9)
T3 Uptake: 32.2 % (ref 22.5–37.0)
T4, Total: 7.6 ug/dL (ref 5.0–12.5)
TSH: 4.15 u[IU]/mL (ref 0.350–4.500)

## 2014-03-01 LAB — COMPLETE METABOLIC PANEL WITH GFR
ALT: 20 U/L (ref 0–35)
AST: 27 U/L (ref 0–37)
Albumin: 3.9 g/dL (ref 3.5–5.2)
Alkaline Phosphatase: 43 U/L (ref 39–117)
BUN: 18 mg/dL (ref 6–23)
CO2: 31 mEq/L (ref 19–32)
CREATININE: 0.83 mg/dL (ref 0.50–1.10)
Calcium: 9.2 mg/dL (ref 8.4–10.5)
Chloride: 101 mEq/L (ref 96–112)
GFR, EST AFRICAN AMERICAN: 83 mL/min
GFR, Est Non African American: 72 mL/min
Glucose, Bld: 86 mg/dL (ref 70–99)
Potassium: 3.7 mEq/L (ref 3.5–5.3)
Sodium: 140 mEq/L (ref 135–145)
Total Bilirubin: 0.5 mg/dL (ref 0.2–1.2)
Total Protein: 5.9 g/dL — ABNORMAL LOW (ref 6.0–8.3)

## 2014-03-01 LAB — HEPATITIS C ANTIBODY: HCV Ab: NEGATIVE

## 2014-03-02 NOTE — Progress Notes (Signed)
Quick Note:  Please notify pt that results are normal.   Provide pt with copy of labs. ______ 

## 2014-03-12 ENCOUNTER — Encounter: Payer: Self-pay | Admitting: *Deleted

## 2014-03-12 DIAGNOSIS — Z1231 Encounter for screening mammogram for malignant neoplasm of breast: Secondary | ICD-10-CM | POA: Insufficient documentation

## 2014-03-27 ENCOUNTER — Encounter: Payer: Self-pay | Admitting: Family Medicine

## 2014-05-13 ENCOUNTER — Other Ambulatory Visit: Payer: Self-pay | Admitting: Family Medicine

## 2014-05-18 NOTE — Telephone Encounter (Signed)
Tramadol refill phoned to pt's pharmacy.

## 2014-09-04 ENCOUNTER — Encounter: Payer: Self-pay | Admitting: Family Medicine

## 2014-09-04 ENCOUNTER — Ambulatory Visit (INDEPENDENT_AMBULATORY_CARE_PROVIDER_SITE_OTHER): Payer: Medicare Other | Admitting: Family Medicine

## 2014-09-04 VITALS — BP 113/72 | HR 69 | Temp 98.4°F | Resp 16 | Ht 61.25 in | Wt 133.6 lb

## 2014-09-04 DIAGNOSIS — R197 Diarrhea, unspecified: Secondary | ICD-10-CM

## 2014-09-04 DIAGNOSIS — I10 Essential (primary) hypertension: Secondary | ICD-10-CM

## 2014-09-04 DIAGNOSIS — E7800 Pure hypercholesterolemia, unspecified: Secondary | ICD-10-CM

## 2014-09-04 DIAGNOSIS — F411 Generalized anxiety disorder: Secondary | ICD-10-CM

## 2014-09-04 MED ORDER — TRAMADOL HCL 50 MG PO TABS: ORAL_TABLET | ORAL | Status: DC

## 2014-09-04 MED ORDER — LISINOPRIL-HYDROCHLOROTHIAZIDE 20-25 MG PO TABS: 1.0000 | ORAL_TABLET | Freq: Every day | ORAL | Status: DC

## 2014-09-04 MED ORDER — TRAZODONE HCL 150 MG PO TABS: 150.0000 mg | ORAL_TABLET | Freq: Every day | ORAL | Status: DC

## 2014-09-04 MED ORDER — MELOXICAM 15 MG PO TABS: 15.0000 mg | ORAL_TABLET | Freq: Every day | ORAL | Status: DC

## 2014-09-04 NOTE — Progress Notes (Signed)
Subjective:    Patient ID: Cheryl Kent, female    DOB: 1946-11-10, 68 y.o.   MRN: [id number]  Hyperlipidemia  Hypertension    This 68 y.o. Cauc female is here for follow-up; she has well controlled HTN and elevated cholesterol. Pt is compliant w/ medications w/o report of adverse effects. She reports daily loose stools, not related to specific foods (limits dairy and gluten). Hx of IBS w/ diarrhea. Thinking about trying probiotics. Not using Miralax daily. Takes Meloxicam and Tramadol once every 2 weeks as needed for arthralgias and soreness. Ranitidine is taken only 1-2 times a week as needed.  Pt has rectal prolapse, recently evaluated by general surgeon who advised surgery. He reviewed procedure w/ pt; she opts to defer at this time; she states procedure sounded very complicated and extensive, requiring lengthy hospitalization.  Pt has GAD; Trazodone controls symptoms and allows pt to sleep. No report of panic attacks, dysphoric mood or agitation or abnormal dreams.  Patient Active Problem List   Diagnosis Date Noted  . Other screening mammogram 03/12/2014  . PPD positive 11/24/2011  . Hypercholesteremia 11/23/2011  . HTN (hypertension) 11/23/2011  . GERD (gastroesophageal reflux disease) 11/23/2011  . GAD (generalized anxiety disorder) 11/23/2011  . SHOULDER PAIN, RIGHT 05/11/2010  . BICEPS TENDINITIS 05/11/2010    Prior to Admission medications   Medication Sig Start Date End Date Taking? Authorizing Provider  aspirin 81 MG tablet Take 81 mg by mouth daily.     Yes Historical Provider, MD  Calcium Carbonate-Vitamin D (CALCIUM + D PO) Take by mouth daily.   Yes Historical Provider, MD  Cholecalciferol (VITAMIN D) 2000 UNITS CAPS Take by mouth daily.   Yes Historical Provider, MD  Coenzyme Q10 (COQ10) 100 MG CAPS Take by mouth daily.   Yes Historical Provider, MD  lisinopril-hydrochlorothiazide (PRINZIDE,ZESTORETIC) 20-25 MG per tablet Take 1 tablet by mouth daily.   Yes  Barton Fanny, MD  meloxicam (MOBIC) 15 MG tablet Take 1 tablet (15 mg total) by mouth daily.   Yes Barton Fanny, MD  Multiple Vitamin (MULTIVITAMIN) tablet Take 1 tablet by mouth daily.   Yes Historical Provider, MD  Omega-3 Fatty Acids (FISH OIL) 1000 MG CAPS Take by mouth daily.   Yes Historical Provider, MD  polyethylene glycol powder (GLYCOLAX/MIRALAX) powder Take 17 g by mouth daily. Use as needed for constipation 02/28/14  Yes Barton Fanny, MD  ranitidine (ZANTAC) 150 MG tablet Take 150 mg by mouth at bedtime.   Yes Historical Provider, MD  rosuvastatin (CRESTOR) 10 MG tablet Take 1 tablet (10 mg total) by mouth daily. 02/28/14  Yes Barton Fanny, MD  traMADol (ULTRAM) 50 MG tablet TAKE 1 TABLE BY MOUTH EVERY 12 HOURS AS NEEDED   Yes Barton Fanny, MD  traZODone (DESYREL) 150 MG tablet Take 1 tablet (150 mg total) by mouth at bedtime.   Yes Barton Fanny, MD     History   Social History  . Marital Status: Widowed    Spouse Name: N/A    Number of Children: N/A  . Years of Education: N/A   Occupational History  . Not on file.   Social History Main Topics  . Smoking status: Former Smoker    Quit date: 12/20/1983  . Smokeless tobacco: Never Used  . Alcohol Use: No     Comment: former use quit in 1980 with treatment  . Drug Use: No  . Sexual Activity: No   Other Topics Concern  .  Not on file   Social History Narrative   Widowed;   Exercise cardio,weights, and abs 5 x a week for 1 hour     Family History  Problem Relation Age of Onset  . Stroke Mother   . Hypertension Mother   . Hyperlipidemia Mother   . Osteoporosis Mother   . Mental illness Mother   . Alzheimer's disease Father   . Diabetes Father   . Hypertension Father   . Cancer Father   . Osteoporosis Sister   . Kidney disease Sister   . Stroke Sister   . Hyperlipidemia Sister   . Hypertension Sister   . Mental illness Sister   . Osteoporosis Maternal Grandmother     . Stroke Maternal Grandmother   . Osteoporosis Paternal Grandmother   . Alzheimer's disease Paternal Grandfather     Review of Systems  Constitutional: Negative for fever, chills, activity change, appetite change, fatigue and unexpected weight change.  Respiratory: Negative.   Cardiovascular: Negative.   Gastrointestinal: Positive for diarrhea. Negative for nausea, abdominal pain and blood in stool.  Endocrine: Negative.   Genitourinary: Negative.   Musculoskeletal: Negative for gait problem and joint swelling.  Skin: Negative.   Neurological: Negative.       Objective:   Physical Exam  Nursing note and vitals reviewed. Constitutional: She is oriented to person, place, and time. She appears well-developed and well-nourished. No distress.  HENT:  Head: Normocephalic and atraumatic.  Left Ear: External ear normal.  Nose: Nose normal.  Mouth/Throat: Oropharynx is clear and moist.  Eyes: Conjunctivae and EOM are normal. Pupils are equal, round, and reactive to light. No scleral icterus.  Neck: Normal range of motion. Neck supple.  Cardiovascular: Normal rate and regular rhythm.   Pulmonary/Chest: Effort normal. No respiratory distress.  Musculoskeletal: Normal range of motion. She exhibits no edema.  Neurological: She is alert and oriented to person, place, and time. No cranial nerve deficit. She exhibits normal muscle tone. Coordination normal.  Skin: Skin is warm and dry. No rash noted. She is not diaphoretic. No erythema. No pallor.  Psychiatric: She has a normal mood and affect. Her behavior is normal. Judgment and thought content normal.       Assessment & Plan:  Frequent loose stools- IBS w/ rectal prolapse; diet modifications as discussed . Trial Probiotic (Digestive Advantage samples given).  Essential hypertension- Stable and well controlled on current medication.  Hypercholesteremia- Continue statin. Reviewed most recent labs with pt.  GAD (generalized anxiety  disorder) - Plan: traZODone (DESYREL) 150 MG tablet   Meds ordered this encounter  Medications  . meloxicam (MOBIC) 15 MG tablet    Sig: Take 1 tablet (15 mg total) by mouth daily.    Dispense:  30 tablet    Refill:  2  . traMADol (ULTRAM) 50 MG tablet    Sig: TAKE 1 TABLE BY MOUTH EVERY 12 HOURS AS NEEDED    Dispense:  100 tablet    Refill:  2  . traZODone (DESYREL) 150 MG tablet    Sig: Take 1 tablet (150 mg total) by mouth at bedtime.    Dispense:  30 tablet    Refill:  11  . lisinopril-hydrochlorothiazide (PRINZIDE,ZESTORETIC) 20-25 MG per tablet    Sig: Take 1 tablet by mouth daily.    Dispense:  30 tablet    Refill:  11

## 2014-09-08 ENCOUNTER — Other Ambulatory Visit: Payer: Self-pay | Admitting: Family Medicine

## 2014-10-07 HISTORY — PX: EYE SURGERY: SHX253

## 2014-11-18 HISTORY — PX: EYE SURGERY: SHX253

## 2014-11-20 ENCOUNTER — Ambulatory Visit (INDEPENDENT_AMBULATORY_CARE_PROVIDER_SITE_OTHER): Payer: Medicare Other | Admitting: Family Medicine

## 2014-11-20 ENCOUNTER — Encounter: Payer: Self-pay | Admitting: Family Medicine

## 2014-11-20 VITALS — BP 124/74 | HR 75 | Temp 98.1°F | Resp 16 | Ht 61.0 in | Wt 133.8 lb

## 2014-11-20 DIAGNOSIS — M79641 Pain in right hand: Secondary | ICD-10-CM

## 2014-11-20 DIAGNOSIS — K219 Gastro-esophageal reflux disease without esophagitis: Secondary | ICD-10-CM

## 2014-11-20 DIAGNOSIS — M25541 Pain in joints of right hand: Secondary | ICD-10-CM

## 2014-11-20 MED ORDER — POLYETHYLENE GLYCOL 3350 17 GM/SCOOP PO POWD: 17.0000 g | Freq: Every day | ORAL | Status: DC

## 2014-11-20 MED ORDER — OMEPRAZOLE 20 MG PO CPDR: 20.0000 mg | DELAYED_RELEASE_CAPSULE | Freq: Every day | ORAL | Status: DC

## 2014-11-20 NOTE — Patient Instructions (Signed)
Food Choices for Gastroesophageal Reflux Disease When you have gastroesophageal reflux disease (GERD), the foods you eat and your eating habits are very important. Choosing the right foods can help ease your discomfort.  WHAT GUIDELINES DO I NEED TO FOLLOW?   Choose fruits, vegetables, whole grains, and low-fat dairy products.   Choose low-fat meat, fish, and poultry.  Limit fats such as oils, salad dressings, butter, nuts, and avocado.   Keep a food diary. This helps you identify foods that cause symptoms.   Avoid foods that cause symptoms. These may be different for everyone.   Eat small meals often instead of 3 large meals a day.   Eat your meals slowly, in a place where you are relaxed.   Limit fried foods.   Cook foods using methods other than frying.   Avoid drinking alcohol.   Avoid drinking large amounts of liquids with your meals.   Avoid bending over or lying down until 2-3 hours after eating.  WHAT FOODS ARE NOT RECOMMENDED?  These are some foods and drinks that may make your symptoms worse: Vegetables Tomatoes. Tomato juice. Tomato and spaghetti sauce. Chili peppers. Onion and garlic. Horseradish. Fruits Oranges, grapefruit, and lemon (fruit and juice). Meats High-fat meats, fish, and poultry. This includes hot dogs, ribs, ham, sausage, salami, and bacon. Dairy Whole milk and chocolate milk. Sour cream. Cream. Butter. Ice cream. Cream cheese.  Drinks Coffee and tea. Bubbly (carbonated) drinks or energy drinks. Condiments Hot sauce. Barbecue sauce.  Sweets/Desserts Chocolate and cocoa. Donuts. Peppermint and spearmint. Fats and Oils High-fat foods. This includes [place] fries and potato chips. Other Vinegar. Strong spices. This includes black pepper, white pepper, red pepper, cayenne, curry powder, cloves, ginger, and chili powder. The items listed above may not be a complete list of foods and drinks to avoid. Contact your dietitian for more  information. Document Released: 06/05/2012 Document Revised: 12/10/2013 Document Reviewed: 10/09/2013 [facility] Patient Information 2015 [place], [place]. This information is not intended to replace advice given to you by your health care provider. Make sure you discuss any questions you have with your health care provider.    Omeprazole- Take this medication in the morning before eating or drinking anything; take with an 8 oz glass of water.   You can still take Zantac (ranitidine) at bedtime if needed.   If the barium swallow shows any abnormalities, I will refer you to a GI specialist.

## 2014-11-20 NOTE — Progress Notes (Signed)
Subjective:    Patient ID: Cheryl Kent, female    DOB: July 22, 1946, 68 y.o.   MRN: [id number]  HPI   This 68 y.o. 68 female is well known to me; she recently had successful bilateral cataract removal and is recovering w/o complications. Pt has GERD and c/o daily symptoms, not relieved w/ bedtime ranitidine. She is concerned about taking PPIs without GI evaluation; her husband had esophageal cancer (etiology being untreated reflux). She has indigestion w/ certain foods (spicy or tomato-based) and when she eats to late in the evening. She denies dysphagia with solids or liquids, nausea,abd pain, melena or weight loss. She takes Digestive Advantage daily and stools are more normal. She sleeps with head of bed elevated.  Pt has mild joint discomfort in hands, especially in 5th digit on R hand. She is R-handed and has slight redness and swelling in index finger also. This is not accompanied by pain, weakness or loss of use.  Patient Active Problem List   Diagnosis Date Noted  . Other screening mammogram 03/12/2014  . PPD positive 11/24/2011  . Hypercholesteremia 11/23/2011  . HTN (hypertension) 11/23/2011  . GERD (gastroesophageal reflux disease) 11/23/2011  . GAD (generalized anxiety disorder) 11/23/2011  . SHOULDER PAIN, RIGHT 05/11/2010  . BICEPS TENDINITIS 05/11/2010    Prior to Admission medications   Medication Sig Start Date End Date Taking? Authorizing Provider  aspirin 81 MG tablet Take 81 mg by mouth daily.     Yes Historical Provider, MD  Calcium Carbonate-Vitamin D (CALCIUM + D PO) Take by mouth daily.   Yes Historical Provider, MD  Cholecalciferol (VITAMIN D) 2000 UNITS CAPS Take by mouth daily.   Yes Historical Provider, MD  Coenzyme Q10 (COQ10) 100 MG CAPS Take by mouth daily.   Yes Historical Provider, MD  Lactobacillus (DIGESTIVE HEALTH PROBIOTIC PO) Take by mouth. Digestive Advantage 1 gummie chewed daily.   Yes Historical Provider, MD  lisinopril-hydrochlorothiazide  (PRINZIDE,ZESTORETIC) 20-25 MG per tablet Take 1 tablet by mouth daily. 09/04/14  Yes Barton Fanny, MD  meloxicam (MOBIC) 15 MG tablet Take 1 tablet (15 mg total) by mouth daily. 09/04/14  Yes Barton Fanny, MD  Multiple Vitamin (MULTIVITAMIN) tablet Take 1 tablet by mouth daily.   Yes Historical Provider, MD  Omega-3 Fatty Acids (FISH OIL) 1000 MG CAPS Take by mouth daily.   Yes Historical Provider, MD  polyethylene glycol powder (GLYCOLAX/MIRALAX) powder Take 17 g by mouth daily. Use as needed for constipation   Yes Barton Fanny, MD  ranitidine (ZANTAC) 150 MG tablet Take 150 mg by mouth at bedtime.   Yes Historical Provider, MD  rosuvastatin (CRESTOR) 10 MG tablet Take 1 tablet (10 mg total) by mouth daily. 02/28/14  Yes Barton Fanny, MD  traMADol (ULTRAM) 50 MG tablet TAKE 1 TABLE BY MOUTH EVERY 12 HOURS AS NEEDED 09/04/14  Yes Barton Fanny, MD  traZODone (DESYREL) 150 MG tablet Take 1 tablet (150 mg total) by mouth at bedtime. 09/04/14  Yes Barton Fanny, MD           History   Social History  . Marital Status: Widowed    Spouse Name: N/A    Number of Children: N/A  . Years of Education: N/A   Occupational History  . Not on file.   Social History Main Topics  . Smoking status: Former Smoker    Quit date: 12/20/1983  . Smokeless tobacco: Never Used  . Alcohol Use: No  Comment: former use quit in 1980 with treatment  . Drug Use: No  . Sexual Activity: No   Other Topics Concern  . Not on file   Social History Narrative   Widowed;   Exercise cardio,weights, and abs 5 x a week for 1 hour    Review of Systems  Constitutional: Negative.   HENT: Negative for dental problem, drooling, mouth sores, sore throat, trouble swallowing and voice change.   Respiratory: Negative for cough, choking and wheezing.   Gastrointestinal: Negative.   Musculoskeletal: Positive for arthralgias.  Skin: Negative.   Neurological: Negative.         Objective:   Physical Exam  Constitutional: She is oriented to person, place, and time. She appears well-developed and well-nourished. No distress.  HENT:  Head: Normocephalic and atraumatic.  Right Ear: External ear normal.  Left Ear: External ear normal.  Nose: Nose normal.  Mouth/Throat: Oropharynx is clear and moist.  Eyes: Conjunctivae and EOM are normal. Pupils are equal, round, and reactive to light. No scleral icterus.  Neck: Normal range of motion. Neck supple. No thyromegaly present.  Cardiovascular: Normal rate and regular rhythm.   Pulmonary/Chest: Effort normal. No respiratory distress.  Abdominal: Soft. Normal appearance and bowel sounds are normal. She exhibits no distension and no mass. There is no hepatosplenomegaly. There is no tenderness. There is no guarding.  Musculoskeletal: Normal range of motion. She exhibits no edema or tenderness.  Hands- MCP joints slightly enlarged (index and 5th digits); grip is excellent and ROM is normal.   Lymphadenopathy:    She has no cervical adenopathy.  Neurological: She is alert and oriented to person, place, and time. No cranial nerve deficit. Coordination normal.  Skin: Skin is warm and dry. She is not diaphoretic.  Psychiatric: She has a normal mood and affect. Her behavior is normal. Judgment and thought content normal.  Nursing note and vitals reviewed.     Assessment & Plan:  Gastroesophageal reflux disease without esophagitis - Trial Omeprazole 20 mg 1 cap every AM; may take ranitidine at bedtime if needed.  Will order UGI study; if abnormal, will refer to GI specialist. Pt requests referral to a different GI practice (established w/ Dr. Collene Mares but would like to see someone else). Anti-reflux measures advised. Plan: DG Esophagus   Pain in joint of right hand- Reassurance re: mild DJD involving hands.

## 2014-11-27 ENCOUNTER — Telehealth: Payer: Self-pay

## 2014-11-27 NOTE — Telephone Encounter (Signed)
LMVM for patient to come in to get her flu shot.

## 2014-12-01 ENCOUNTER — Ambulatory Visit (HOSPITAL_COMMUNITY)
Admission: RE | Admit: 2014-12-01 | Discharge: 2014-12-01 | Disposition: A | Discharge status: Home / Self Care | Payer: Medicare Other | Source: Ambulatory Visit | Attending: Family Medicine | Admitting: Family Medicine

## 2014-12-01 DIAGNOSIS — K219 Gastro-esophageal reflux disease without esophagitis: Secondary | ICD-10-CM | POA: Insufficient documentation

## 2014-12-01 DIAGNOSIS — K449 Diaphragmatic hernia without obstruction or gangrene: Secondary | ICD-10-CM | POA: Diagnosis not present

## 2014-12-02 ENCOUNTER — Telehealth: Payer: Self-pay

## 2014-12-02 NOTE — Telephone Encounter (Signed)
Called and informed pt of swallow study results. Pt voiced understanding.

## 2015-02-16 ENCOUNTER — Other Ambulatory Visit: Payer: Self-pay | Admitting: Family Medicine

## 2015-02-19 ENCOUNTER — Other Ambulatory Visit: Payer: Self-pay | Admitting: Family Medicine

## 2015-03-10 ENCOUNTER — Other Ambulatory Visit: Payer: Self-pay | Admitting: Family Medicine

## 2015-03-12 ENCOUNTER — Ambulatory Visit (INDEPENDENT_AMBULATORY_CARE_PROVIDER_SITE_OTHER): Payer: Medicare Other | Admitting: Family Medicine

## 2015-03-12 ENCOUNTER — Encounter: Payer: Self-pay | Admitting: Family Medicine

## 2015-03-12 VITALS — BP 120/60 | HR 67 | Temp 98.0°F | Resp 16 | Ht 61.0 in | Wt 132.2 lb

## 2015-03-12 DIAGNOSIS — Z1321 Encounter for screening for nutritional disorder: Secondary | ICD-10-CM | POA: Diagnosis not present

## 2015-03-12 DIAGNOSIS — M899 Disorder of bone, unspecified: Secondary | ICD-10-CM | POA: Diagnosis not present

## 2015-03-12 DIAGNOSIS — E7800 Pure hypercholesterolemia, unspecified: Secondary | ICD-10-CM

## 2015-03-12 DIAGNOSIS — K623 Rectal prolapse: Secondary | ICD-10-CM

## 2015-03-12 DIAGNOSIS — Z0000 Encounter for general adult medical examination without abnormal findings: Secondary | ICD-10-CM | POA: Diagnosis not present

## 2015-03-12 DIAGNOSIS — I10 Essential (primary) hypertension: Secondary | ICD-10-CM

## 2015-03-12 DIAGNOSIS — Z780 Asymptomatic menopausal state: Secondary | ICD-10-CM

## 2015-03-12 DIAGNOSIS — M8580 Other specified disorders of bone density and structure, unspecified site: Secondary | ICD-10-CM

## 2015-03-12 DIAGNOSIS — R0989 Other specified symptoms and signs involving the circulatory and respiratory systems: Secondary | ICD-10-CM | POA: Diagnosis not present

## 2015-03-12 LAB — CBC WITH DIFFERENTIAL/PLATELET
BASOS PCT: 1 % (ref 0–1)
Basophils Absolute: 0.1 10*3/uL (ref 0.0–0.1)
Eosinophils Absolute: 0.1 10*3/uL (ref 0.0–0.7)
Eosinophils Relative: 2 % (ref 0–5)
HEMATOCRIT: 43.5 % (ref 36.0–46.0)
HEMOGLOBIN: 14.8 g/dL (ref 12.0–15.0)
Lymphocytes Relative: 30 % (ref 12–46)
Lymphs Abs: 1.7 10*3/uL (ref 0.7–4.0)
MCH: 33.5 pg (ref 26.0–34.0)
MCHC: 34 g/dL (ref 30.0–36.0)
MCV: 98.4 fL (ref 78.0–100.0)
MONOS PCT: 9 % (ref 3–12)
MPV: 9.1 fL (ref 8.6–12.4)
Monocytes Absolute: 0.5 10*3/uL (ref 0.1–1.0)
NEUTROS PCT: 58 % (ref 43–77)
Neutro Abs: 3.2 10*3/uL (ref 1.7–7.7)
Platelets: 284 10*3/uL (ref 150–400)
RBC: 4.42 MIL/uL (ref 3.87–5.11)
RDW: 12.7 % (ref 11.5–15.5)
WBC: 5.5 10*3/uL (ref 4.0–10.5)

## 2015-03-12 LAB — LIPID PANEL
CHOL/HDL RATIO: 2.2 ratio
CHOLESTEROL: 186 mg/dL (ref 0–200)
HDL: 85 mg/dL (ref >=46)
LDL Cholesterol: 91 mg/dL (ref 0–99)
TRIGLYCERIDES: 51 mg/dL (ref <150)
VLDL: 10 mg/dL (ref 0–40)

## 2015-03-12 LAB — COMPLETE METABOLIC PANEL WITH GFR
ALBUMIN: 4.3 g/dL (ref 3.5–5.2)
ALT: 18 U/L (ref 0–35)
AST: 27 U/L (ref 0–37)
Alkaline Phosphatase: 42 U/L (ref 39–117)
BUN: 14 mg/dL (ref 6–23)
CALCIUM: 10 mg/dL (ref 8.4–10.5)
CHLORIDE: 102 meq/L (ref 96–112)
CO2: 30 mEq/L (ref 19–32)
Creat: 0.8 mg/dL (ref 0.50–1.10)
GFR, Est African American: 86 mL/min
GFR, Est Non African American: 75 mL/min
Glucose, Bld: 89 mg/dL (ref 70–99)
POTASSIUM: 4.8 meq/L (ref 3.5–5.3)
SODIUM: 142 meq/L (ref 135–145)
Total Bilirubin: 0.7 mg/dL (ref 0.2–1.2)
Total Protein: 6.1 g/dL (ref 6.0–8.3)

## 2015-03-12 MED ORDER — ROSUVASTATIN CALCIUM 10 MG PO TABS: 10.0000 mg | ORAL_TABLET | Freq: Every day | ORAL | Status: DC

## 2015-03-12 MED ORDER — LISINOPRIL-HYDROCHLOROTHIAZIDE 20-25 MG PO TABS: 1.0000 | ORAL_TABLET | Freq: Every day | ORAL | Status: DC

## 2015-03-12 NOTE — Patient Instructions (Signed)
Keeping You Healthy  Get These Tests  Blood Pressure- Have your blood pressure checked by your healthcare provider at least once a year.  Normal blood pressure is 120/80.  Weight- Have your body mass index (BMI) calculated to screen for obesity.  BMI is a measure of body fat based on height and weight.  You can calculate your own BMI at [employer]  Cholesterol- Have your cholesterol checked every year.  Diabetes- Have your blood sugar checked every year if you have high blood pressure, high cholesterol, a family history of diabetes or if you are overweight.  Pap Smear- Have a pap smear every 1 to 3 years if you have been sexually active.  If you are older than 65 and recent pap smears have been normal you may not need additional pap smears.  In addition, if you have had a hysterectomy  For benign disease additional pap smears are not necessary.  Mammogram-Yearly mammograms are essential for early detection of breast cancer  Screening for Colon Cancer- Colonoscopy starting at age 65. Screening may begin sooner depending on your family history and other health conditions.  Follow up colonoscopy as directed by your Gastroenterologist.  Screening for Osteoporosis- Screening begins at age 103 with bone density scanning, sooner if you are at higher risk for developing Osteoporosis.  Get these medicines  Calcium with Vitamin D- Your body requires 1200-1500 mg of Calcium a day and [phone] IU of Vitamin D a day.  You can only absorb 500 mg of Calcium at a time therefore Calcium must be taken in 2 or 3 separate doses throughout the day.  Hormones- Hormone therapy has been associated with increased risk for certain cancers and heart disease.  Talk to your healthcare provider about if you need relief from menopausal symptoms.  Aspirin- Ask your healthcare provider about taking Aspirin to prevent Heart Disease and Stroke.  Get these Immuniztions  Flu shot- Every fall  Pneumonia  shot- Once after the age of 31; if you are younger ask your healthcare provider if you need a pneumonia shot.  Tetanus- Every ten years. Next due in 2022.  Zostavax- Once after the age of 23 to prevent shingles.  Take these steps  Don't smoke- Your healthcare provider can help you quit. For tips on how to quit, ask your healthcare provider or go to www.smokefree.gov or call 1-800 QUIT-NOW.  Be physically active- Exercise 5 days a week for a minimum of 30 minutes.  If you are not already physically active, start slow and gradually work up to 30 minutes of moderate physical activity.  Try walking, dancing, bike riding, swimming, etc.  Eat a healthy diet- Eat a variety of healthy foods such as fruits, vegetables, whole grains, low fat milk, low fat cheeses, yogurt, lean meats, chicken, fish, eggs, dried beans, tofu, etc.  For more information go to www.thenutritionsource.org  Dental visit- Brush and floss teeth twice daily; visit your dentist twice a year.  Eye exam- Visit your Optometrist or Ophthalmologist yearly.  Drink alcohol in moderation- Limit alcohol intake to one drink or less a day.  Never drink and drive.  Depression- Your emotional health is as important as your physical health.  If you're feeling down or losing interest in things you normally enjoy, please talk to your healthcare provider.  Seat Belts- can save your life; always wear one  Smoke/Carbon Monoxide detectors- These detectors need to be installed on the appropriate level of your home.  Replace batteries at least once a year.  Violence- If anyone is threatening or hurting you, please tell your healthcare provider.  Living Will/ Health care power of attorney- Discuss with your healthcare provider and family.    I will be retiring at the end of May 2016. It has been my pleasure and privilege to have you as a patient. Continue to care for yourself and follow-up here in 6 months. I think you should follow-up with  Tor Netters, FNP (nurse practitioner). As long as you do what you need to for good health, you will do well, whoever is taking care of you. All the Liz Malady, MD

## 2015-03-12 NOTE — Progress Notes (Signed)
Subjective:    Patient ID: Cheryl Kent, female    DOB: May 03, 1946, 69 y.o.   MRN: [id number]  HPI  This 69 y.o. Female is here for [facility] Subsequent Annual CPE. She is compliant with medications for HTN and lipid disorder as well as healthy lifestyle recommendations. She has joint pain and uses Meloxicam and Tramadol as needed, about 2x per week. She exercises daily and maintains a healthy body weight. Persistent problem of major concern is rectal prolapse. Surgical evaluation w/ Dr. Johney Maine >> recommendation was surgery (not pt preference). She would like second opinion.  HCM: MMG- Current (2015); pt ok w/ waiting until 2017 per [facility] guideline.           CRS- Current (2013).           IMM- Current.           Vision- Annually; has been prescribed a MVI for eye health.            Dental- Current; has a partial.  Patient Active Problem List   Diagnosis Date Noted  . Other screening mammogram 03/12/2014  . PPD positive 11/24/2011  . Hypercholesteremia 11/23/2011  . HTN (hypertension) 11/23/2011  . GERD (gastroesophageal reflux disease) 11/23/2011  . GAD (generalized anxiety disorder) 11/23/2011  . SHOULDER PAIN, RIGHT 05/11/2010  . BICEPS TENDINITIS 05/11/2010    Prior to Admission medications   Medication Sig Start Date End Date Taking? Authorizing Provider  aspirin 81 MG tablet Take 81 mg by mouth daily.     Yes Historical Provider, MD  Calcium Carbonate-Vitamin D (CALCIUM + D PO) Take by mouth daily.   Yes Historical Provider, MD  Cholecalciferol (VITAMIN D) 2000 UNITS CAPS Take by mouth daily.   Yes Historical Provider, MD  Coenzyme Q10 (COQ10) 100 MG CAPS Take by mouth daily.   Yes Historical Provider, MD  Lactobacillus (DIGESTIVE HEALTH PROBIOTIC PO) Take by mouth. Digestive Advantage 1 gummie chewed daily.   Yes Historical Provider, MD  lisinopril-hydrochlorothiazide (PRINZIDE,ZESTORETIC) 20-25 MG per tablet Take 1 tablet by mouth daily.   Yes Barton Fanny, MD  meloxicam  (MOBIC) 15 MG tablet Take 1 tablet (15 mg total) by mouth daily. 09/04/14  Yes Barton Fanny, MD  Multiple Vitamin (MULTIVITAMIN) tablet Take 1 tablet by mouth daily.   Yes Historical Provider, MD  Omega-3 Fatty Acids (FISH OIL) 1000 MG CAPS Take by mouth daily.   Yes Historical Provider, MD  polyethylene glycol powder (GLYCOLAX/MIRALAX) powder Take 17 g by mouth daily. Use as needed for constipation 11/20/14  Yes Barton Fanny, MD  ranitidine (ZANTAC) 150 MG tablet Take 150 mg by mouth at bedtime.   Yes Historical Provider, MD  rosuvastatin (CRESTOR) 10 MG tablet Take 1 tablet (10 mg total) by mouth daily.   Yes Barton Fanny, MD  traMADol (ULTRAM) 50 MG tablet TAKE 1 TABLE BY MOUTH EVERY 12 HOURS AS NEEDED 09/04/14  Yes Barton Fanny, MD  traZODone (DESYREL) 150 MG tablet Take 1 tablet (150 mg total) by mouth at bedtime. 09/04/14  Yes Barton Fanny, MD    Past Surgical History  Procedure Laterality Date  . Tonsillectomy and adenoidectomy      childhood  . Breast surgery      #1 age 69 yo  #2 age 76's  . Tubal ligation    . Shoulder surgery    . Hand surgery    . Foot surgery    . Eye surgery Left 10/07/2014  Cataract removal- Dr. Herbert Deaner  . Eye surgery Right 11/18/2014    Cataract removal- Dr. Herbert Deaner    History   Social History  . Marital Status: Widowed    Spouse Name: N/A  . Number of Children: N/A  . Years of Education: N/A   Occupational History  . Not on file.   Social History Main Topics  . Smoking status: Former Smoker -- 1.50 packs/day for 25 years    Types: Cigarettes    Quit date: 12/20/1983  . Smokeless tobacco: Never Used  . Alcohol Use: No     Comment: former use quit in 1980 with treatment  . Drug Use: No  . Sexual Activity: No   Other Topics Concern  . Not on file   Social History Narrative   Widowed;   Exercise cardio,weights, and abs 5 x a week for 1 hour    Family History  Problem Relation Age of Onset  . Stroke  Mother   . Hypertension Mother   . Hyperlipidemia Mother   . Osteoporosis Mother   . Mental illness Mother   . Alzheimer's disease Father   . Diabetes Father   . Hypertension Father   . Cancer Father     prostate  . Osteoporosis Sister   . Kidney disease Sister   . Stroke Sister   . Hyperlipidemia Sister   . Hypertension Sister   . Mental illness Sister   . Cancer Sister     leukemia  . Osteoporosis Maternal Grandmother   . Stroke Maternal Grandmother   . Osteoporosis Paternal Grandmother   . Alzheimer's disease Paternal Grandfather     Review of Systems  Constitutional: Negative.   HENT: Negative.   Respiratory: Negative.   Cardiovascular: Negative.   Gastrointestinal: Positive for rectal pain.  Endocrine: Negative.   Genitourinary: Negative.   Musculoskeletal: Negative.   Skin: Negative.   Allergic/Immunologic: Negative.   Neurological: Negative.   Hematological: Negative.   Psychiatric/Behavioral: Negative.        Objective:   Physical Exam  Constitutional: She is oriented to person, place, and time. Vital signs are normal. She appears well-developed and well-nourished. No distress.  Blood pressure 120/60, pulse 67, temperature 98 F (36.7 C), temperature source Oral, resp. rate 16, height 5\' 1"  (1.549 m), weight 132 lb 3.2 oz (59.966 kg), SpO2 98 %.   HENT:  Head: Normocephalic and atraumatic.  Right Ear: Hearing, tympanic membrane, external ear and ear canal normal.  Left Ear: Hearing, tympanic membrane, external ear and ear canal normal.  Nose: Nose normal. No nasal deformity or septal deviation.  Mouth/Throat: Uvula is midline, oropharynx is clear and moist and mucous membranes are normal. She has dentures. No oral lesions. No uvula swelling.  Eyes: Conjunctivae, EOM and lids are normal. Pupils are equal, round, and reactive to light. No scleral icterus.  Wears corrective lenses.  Neck: Trachea normal, normal range of motion and phonation normal. Neck  supple. No JVD present. No spinous process tenderness and no muscular tenderness present. Carotid bruit is present. No thyroid mass and no thyromegaly present.  R carotid bruit.  Cardiovascular: Normal rate, regular rhythm, S1 normal, S2 normal, normal heart sounds and normal pulses.   No extrasystoles are present. PMI is not displaced.  Exam reveals no gallop and no friction rub.   No murmur heard. Pulmonary/Chest: Effort normal and breath sounds normal. No respiratory distress. She has no decreased breath sounds. She has no wheezes. She has no rhonchi. She has no  rales. Right breast exhibits no inverted nipple, no mass, no nipple discharge, no skin change and no tenderness. Left breast exhibits no inverted nipple, no mass, no nipple discharge, no skin change and no tenderness. Breasts are symmetrical.  Abdominal: Soft. Normal appearance, normal aorta and bowel sounds are normal. She exhibits no distension, no abdominal bruit, no pulsatile midline mass and no mass. There is no hepatosplenomegaly. There is no guarding and no CVA tenderness.  Genitourinary:  Deferred.  Musculoskeletal:       Cervical back: Normal.       Thoracic back: Normal.       Lumbar back: Normal.  Remainder of exam remarkable for very mild degenerative changes in hands/ wrists and knees.  Lymphadenopathy:       Head (right side): No submental, no submandibular, no tonsillar, no preauricular, no posterior auricular and no occipital adenopathy present.       Head (left side): No submental, no submandibular, no tonsillar, no preauricular, no posterior auricular and no occipital adenopathy present.    She has no cervical adenopathy.    She has no axillary adenopathy.       Right: No inguinal and no supraclavicular adenopathy present.       Left: No inguinal and no supraclavicular adenopathy present.  Neurological: She is alert and oriented to person, place, and time. She has normal strength and normal reflexes. She displays no  atrophy and no tremor. No cranial nerve deficit or sensory deficit. She exhibits normal muscle tone. She displays a negative Romberg sign. Coordination and gait normal.  GET UP and GO test: time= 7 sec.  Skin: Skin is warm, dry and intact. No ecchymosis, no lesion and no rash noted. She is not diaphoretic. No cyanosis or erythema. No pallor. Nails show no clubbing.  Psychiatric: Her speech is normal and behavior is normal. Judgment and thought content normal. Her mood appears anxious. Her affect is not labile and not inappropriate. Cognition and memory are normal. She does not exhibit a depressed mood.  Nursing note and vitals reviewed.      Assessment & Plan:  Medicare annual wellness visit, subsequent- Congratulated pt on healthy lifestyle!  Postmenopausal estrogen deficiency - Plan: DG Bone Density  Moderate osteopenia - Plan: DG Bone Density  Rectal prolapse - Pt requests referral for second opinion, hoping for non-surgical recommendations. Plan: Ambulatory referral to Gynecology  Essential hypertension -  Stable and well controlled. Plan: COMPLETE METABOLIC PANEL WITH GFR  Hypercholesteremia - Continue current w/ Crestor; no adverse effects with statin. Plan: Lipid panel, US Carotid Duplex Bilateral  Encounter for vitamin deficiency screening - Plan: Vitamin B12  Left carotid bruit - Refer to Dr. Scot Dock so that pt is established with that practice for future surveillance. Plan: US Carotid Duplex Bilateral   Meds ordered this encounter  Medications  . lisinopril-hydrochlorothiazide (PRINZIDE,ZESTORETIC) 20-25 MG per tablet    Sig: Take 1 tablet by mouth daily.    Dispense:  90 tablet    Refill:  3  . rosuvastatin (CRESTOR) 10 MG tablet    Sig: Take 1 tablet (10 mg total) by mouth daily.    Dispense:  30 tablet    Refill:  11

## 2015-03-13 LAB — VITAMIN B12: VITAMIN B 12: 705 pg/mL (ref 211–911)

## 2015-03-14 NOTE — Progress Notes (Signed)
Quick Note:  Please notify pt that results are normal.   Provide pt with copy of labs. ______ 

## 2015-03-17 ENCOUNTER — Telehealth: Payer: Self-pay

## 2015-03-17 NOTE — Telephone Encounter (Signed)
I ordered the bone density study when pt was here on 03/12/2015 for CPE.

## 2015-03-17 NOTE — Telephone Encounter (Signed)
Pt has appt on 4/4 for bone density and needs referral. Please place. Thanks. At [facility]

## 2015-03-18 ENCOUNTER — Other Ambulatory Visit: Payer: Self-pay

## 2015-03-18 NOTE — Telephone Encounter (Signed)
Notified pt. 

## 2015-03-21 ENCOUNTER — Ambulatory Visit (INDEPENDENT_AMBULATORY_CARE_PROVIDER_SITE_OTHER): Payer: Medicare Other | Admitting: Family Medicine

## 2015-03-21 VITALS — BP 132/80 | HR 84 | Temp 99.7°F | Resp 18 | Ht 62.0 in | Wt 131.6 lb

## 2015-03-21 DIAGNOSIS — R509 Fever, unspecified: Secondary | ICD-10-CM | POA: Diagnosis not present

## 2015-03-21 DIAGNOSIS — R8271 Bacteriuria: Secondary | ICD-10-CM

## 2015-03-21 LAB — POCT CBC
Granulocyte percent: 81.4 %G — AB (ref 37–80)
HCT, POC: 41.8 % (ref 37.7–47.9)
Hemoglobin: 13.8 g/dL (ref 12.2–16.2)
Lymph, poc: 1.2 (ref 0.6–3.4)
MCH, POC: 31.5 pg — AB (ref 27–31.2)
MCHC: 33 g/dL (ref 31.8–35.4)
MCV: 95.5 fL (ref 80–97)
MID (cbc): 0.3 (ref 0–0.9)
MPV: 6.2 fL (ref 0–99.8)
POC GRANULOCYTE: 6.6 (ref 2–6.9)
POC LYMPH PERCENT: 14.6 %L (ref 10–50)
POC MID %: 4 %M (ref 0–12)
Platelet Count, POC: 285 10*3/uL (ref 142–424)
RBC: 4.37 M/uL (ref 4.04–5.48)
RDW, POC: 12.9 %
WBC: 8.1 10*3/uL (ref 4.6–10.2)

## 2015-03-21 LAB — POCT URINALYSIS DIPSTICK
Bilirubin, UA: NEGATIVE
GLUCOSE UA: NEGATIVE
Ketones, UA: 15
LEUKOCYTES UA: NEGATIVE
Nitrite, UA: NEGATIVE
PROTEIN UA: NEGATIVE
SPEC GRAV UA: 1.02
Urobilinogen, UA: 0.2
pH, UA: 6.5

## 2015-03-21 LAB — POCT UA - MICROSCOPIC ONLY
Casts, Ur, LPF, POC: NEGATIVE
Crystals, Ur, HPF, POC: NEGATIVE
Yeast, UA: NEGATIVE

## 2015-03-21 LAB — POCT INFLUENZA A/B
Influenza A, POC: NEGATIVE
Influenza B, POC: NEGATIVE

## 2015-03-21 MED ORDER — OSELTAMIVIR PHOSPHATE 30 MG PO CAPS: 30.0000 mg | ORAL_CAPSULE | Freq: Two times a day (BID) | ORAL | Status: DC

## 2015-03-21 MED ORDER — OSELTAMIVIR PHOSPHATE 75 MG PO CAPS: 75.0000 mg | ORAL_CAPSULE | Freq: Two times a day (BID) | ORAL | Status: DC

## 2015-03-21 MED ORDER — CIPROFLOXACIN HCL 250 MG PO TABS: 250.0000 mg | ORAL_TABLET | Freq: Two times a day (BID) | ORAL | Status: DC

## 2015-03-21 NOTE — Progress Notes (Addendum)
[facility] [address] [place] [address] [phone]  Date:  03/21/2015   Name:  Cheryl Kent   DOB:  1946/07/09   MRN:  [id number]  PCP:  Ellsworth Lennox, MD    Chief Complaint: Fever and Flu Like Sx   History of Present Illness:  Cheryl Kent is a 69 y.o. very pleasant female patient who presents with the following:  Here today with illness- she has noted a fever and illness for the last 2 days.  She has noted tmax of 101.7 at home. No antipyretics this am but she does have some ibuprofen in her bag to take if ok with [place].  She notes body aches. Her "back and neck are sore" No cough, but she has felt a bit nauseated  No vomitng, she is eating ok No URI sx such as runny nose, ST, nasal congestion, no dysuria/ urinary frequency No sick contacts Noted slightly low O2 sat- she denies any SOB. Former smoker but quit a long time ago  Patient Active Problem List   Diagnosis Date Noted  . Other screening mammogram 03/12/2014  . PPD positive 11/24/2011  . Hypercholesteremia 11/23/2011  . HTN (hypertension) 11/23/2011  . GERD (gastroesophageal reflux disease) 11/23/2011  . GAD (generalized anxiety disorder) 11/23/2011  . SHOULDER PAIN, RIGHT 05/11/2010  . BICEPS TENDINITIS 05/11/2010    Past Medical History  Diagnosis Date  . Arthritis   . Substance abuse   . Cataract   . Anxiety   . Tuberculosis 2012  . Allergy   . GERD (gastroesophageal reflux disease)   . Positive QuantiFERON-TB Gold test   . Hypertension     Past Surgical History  Procedure Laterality Date  . Tonsillectomy and adenoidectomy      childhood  . Breast surgery      #1 age 65 yo  #2 age 6's  . Tubal ligation    . Shoulder surgery    . Hand surgery    . Foot surgery    . Eye surgery Left 10/07/2014    Cataract removal- Dr. Herbert Deaner  . Eye surgery Right 11/18/2014    Cataract removal- Dr. Herbert Deaner    History  Substance Use Topics  . Smoking status: Former Smoker --  1.50 packs/day for 25 years    Types: Cigarettes    Quit date: 12/20/1983  . Smokeless tobacco: Never Used  . Alcohol Use: No     Comment: former use quit in 1980 with treatment    Family History  Problem Relation Age of Onset  . Stroke Mother   . Hypertension Mother   . Hyperlipidemia Mother   . Osteoporosis Mother   . Mental illness Mother   . Alzheimer's disease Father   . Diabetes Father   . Hypertension Father   . Cancer Father     prostate  . Osteoporosis Sister   . Kidney disease Sister   . Stroke Sister   . Hyperlipidemia Sister   . Hypertension Sister   . Mental illness Sister   . Cancer Sister     leukemia  . Osteoporosis Maternal Grandmother   . Stroke Maternal Grandmother   . Osteoporosis Paternal Grandmother   . Alzheimer's disease Paternal Grandfather     Allergies  Allergen Reactions  . Penicillins Other (See Comments)    infant  . Pneumococcal Vaccines   . Sulfamethoxazole-Trimethoprim   . Sulfonamide Derivatives     Medication list has been reviewed and updated.  Current  Outpatient Prescriptions on File Prior to Visit  Medication Sig Dispense Refill  . aspirin 81 MG tablet Take 81 mg by mouth daily.      . Calcium Carbonate-Vitamin D (CALCIUM + D PO) Take by mouth daily.    . Cholecalciferol (VITAMIN D) 2000 UNITS CAPS Take by mouth daily.    . Coenzyme Q10 (COQ10) 100 MG CAPS Take by mouth daily.    . Lactobacillus (DIGESTIVE HEALTH PROBIOTIC PO) Take by mouth. Digestive Advantage 1 gummie chewed daily.    Marland Kitchen lisinopril-hydrochlorothiazide (PRINZIDE,ZESTORETIC) 20-25 MG per tablet Take 1 tablet by mouth daily. 90 tablet 3  . meloxicam (MOBIC) 15 MG tablet Take 1 tablet (15 mg total) by mouth daily. 30 tablet 2  . Multiple Vitamin (MULTIVITAMIN) tablet Take 1 tablet by mouth daily.    . Omega-3 Fatty Acids (FISH OIL) 1000 MG CAPS Take by mouth daily.    . polyethylene glycol powder (GLYCOLAX/MIRALAX) powder Take 17 g by mouth daily. Use as  needed for constipation 850 g 3  . ranitidine (ZANTAC) 150 MG tablet Take 150 mg by mouth at bedtime.    . rosuvastatin (CRESTOR) 10 MG tablet Take 1 tablet (10 mg total) by mouth daily. 30 tablet 11  . traMADol (ULTRAM) 50 MG tablet TAKE 1 TABLE BY MOUTH EVERY 12 HOURS AS NEEDED 100 tablet 2  . traZODone (DESYREL) 150 MG tablet Take 1 tablet (150 mg total) by mouth at bedtime. 30 tablet 11   No current facility-administered medications on file prior to visit.    Review of Systems:  As per HPI- otherwise negative.   Physical Examination: Filed Vitals:   03/21/15 0817  BP: 132/80  Pulse: 112  Temp: 100.3 F (37.9 C)  Resp: 18   Filed Vitals:   03/21/15 0817  Height: 5\' 2"  (1.575 m)  Weight: 131 lb 9.6 oz (59.693 kg)   Body mass index is 24.06 kg/(m^2). Ideal Body Weight: Weight in (lb) to have BMI = 25: 136.4  GEN: WDWN, NAD, Non-toxic, A & O x 3, looks well HEENT: Atraumatic, Normocephalic. Neck supple. No masses, No LAD.  Bilateral TM wnl, oropharynx normal.  PEERL,EOMI.   Ears and Nose: No external deformity. CV: RRR, No M/G/R. No JVD. No thrill. No extra heart sounds. PULM: CTA B, no wheezes, crackles, rhonchi. No retractions. No resp. distress. No accessory muscle use. ABD: S, NT, ND, +BS. No rebound. No HSM.  Benign belly, no CVA tenderness  EXTR: No c/c/e NEURO Normal gait.  PSYCH: Normally interactive. Conversant. Not depressed or anxious appearing.  Calm demeanor.   Pt took some of her own ibuprofen with a snack in clinic Creat clearance is under 60 per EMR so will dose with 30 mg of tamiflu BID Normal qtC on EKG less than one year ago  Results for orders placed or performed in visit on 03/21/15  POCT Influenza A/B  Result Value Ref Range   Influenza A, POC Negative    Influenza B, POC Negative   POCT CBC  Result Value Ref Range   WBC 8.1 4.6 - 10.2 K/uL   Lymph, poc 1.2 0.6 - 3.4   POC LYMPH PERCENT 14.6 10 - 50 %L   MID (cbc) 0.3 0 - 0.9   POC MID %  4.0 0 - 12 %M   POC Granulocyte 6.6 2 - 6.9   Granulocyte percent 81.4 (A) 37 - 80 %G   RBC 4.37 4.04 - 5.48 M/uL   Hemoglobin 13.8 12.2 -  16.2 g/dL   HCT, POC 41.8 37.7 - 47.9 %   MCV 95.5 80 - 97 fL   MCH, POC 31.5 (A) 27 - 31.2 pg   MCHC 33.0 31.8 - 35.4 g/dL   RDW, POC 12.9 %   Platelet Count, POC 285 142 - 424 K/uL   MPV 6.2 0 - 99.8 fL  POCT urinalysis dipstick  Result Value Ref Range   Color, UA yellow    Clarity, UA clear    Glucose, UA negative    Bilirubin, UA negative    Ketones, UA 15    Spec Grav, UA 1.020    Blood, UA trace-intact    pH, UA 6.5    Protein, UA negative    Urobilinogen, UA 0.2    Nitrite, UA negative    Leukocytes, UA Negative   POCT UA - Microscopic Only  Result Value Ref Range   WBC, Ur, HPF, POC 0-1    RBC, urine, microscopic 0-3    Bacteria, U Microscopic trace    Mucus, UA trace    Epithelial cells, urine per micros 0-1    Crystals, Ur, HPF, POC negative    Casts, Ur, LPF, POC negative    Yeast, UA negative     Assessment and Plan: Other specified fever - Plan: POCT Influenza A/B, POCT CBC, POCT urinalysis dipstick, POCT UA - Microscopic Only, Urine culture, oseltamivir (TAMIFLU) 30 MG capsule, ciprofloxacin (CIPRO) 250 MG tablet discussed with her in detail.  She may have flu with a false negative test.  She would like to start tamiflu, adjusted dose per her creat clearance Will also use cipro while we await her urine culture for possible symptomatic UTI She will seek care if any worsening, I will contact her with urine culture asap   Signed Lamar Blinks, MD   Called 4/5 and LMOM.  Urine grew a small amount of mixed bacteria, likely contamination. Ok to stop cipro now. Let me know if not feeling better.  Please come by in about 2 weeks for a repeat UA and culture

## 2015-03-21 NOTE — Patient Instructions (Signed)
I will be in touch with your urine culture results For now we will treat you for flu and possible UTI with tamiflu and cipro antibiotic.   Continue to control your fever with ibuprofen, and let me know if you are getting worse or have any other symptoms .

## 2015-03-23 LAB — URINE CULTURE: Colony Count: 35000

## 2015-03-24 ENCOUNTER — Encounter: Payer: Self-pay | Admitting: Family Medicine

## 2015-03-24 NOTE — Addendum Note (Signed)
Addended by: Lamar Blinks C on: 03/24/2015 12:52 PM   Modules accepted: Orders

## 2015-04-13 ENCOUNTER — Telehealth: Payer: Self-pay

## 2015-04-13 DIAGNOSIS — R8271 Bacteriuria: Secondary | ICD-10-CM

## 2015-04-13 NOTE — Telephone Encounter (Signed)
Pt called and wanted to check on the status of her urine. She said she dropped it off last week, but I don't think anyone ran it. She is not having any symptoms at all and wanted to know if it would be ok to just recheck it at her f/u appt?

## 2015-04-14 NOTE — Telephone Encounter (Signed)
Called and LMOM- I am sorry that no one ran her urine.  Will CB later to discuss further

## 2015-04-15 NOTE — Telephone Encounter (Signed)
Called her and was able to discuss.  She is feeling well.  She never had any UTI sx really. Called and discussed with her in detail.  She did have 0-3 RBC per field- we don't know what her average number of cells is exactly.  Would be reasonable to hav her see urology for full eval, or to recheck her urine and see if we get a clearer answer.  She plans to come and give a sample tomorrow

## 2015-04-16 ENCOUNTER — Other Ambulatory Visit (INDEPENDENT_AMBULATORY_CARE_PROVIDER_SITE_OTHER): Payer: Medicare Other

## 2015-04-16 DIAGNOSIS — R8271 Bacteriuria: Secondary | ICD-10-CM

## 2015-04-16 DIAGNOSIS — N390 Urinary tract infection, site not specified: Secondary | ICD-10-CM

## 2015-04-16 LAB — POCT URINALYSIS DIPSTICK
Bilirubin, UA: NEGATIVE
Glucose, UA: NEGATIVE
Ketones, UA: NEGATIVE
Leukocytes, UA: NEGATIVE
Nitrite, UA: NEGATIVE
PROTEIN UA: NEGATIVE
RBC UA: NEGATIVE
SPEC GRAV UA: 1.01
Urobilinogen, UA: 0.2
pH, UA: 7

## 2015-04-16 LAB — POCT UA - MICROSCOPIC ONLY
BACTERIA, U MICROSCOPIC: NEGATIVE
CRYSTALS, UR, HPF, POC: NEGATIVE
Casts, Ur, LPF, POC: NEGATIVE
EPITHELIAL CELLS, URINE PER MICROSCOPY: NEGATIVE
MUCUS UA: NEGATIVE
RBC, URINE, MICROSCOPIC: NEGATIVE
WBC, UR, HPF, POC: NEGATIVE
Yeast, UA: NEGATIVE

## 2015-04-19 ENCOUNTER — Telehealth: Payer: Self-pay | Admitting: Family Medicine

## 2015-04-19 DIAGNOSIS — K623 Rectal prolapse: Secondary | ICD-10-CM

## 2015-04-19 NOTE — Telephone Encounter (Signed)
Called to discuss with her. Her repeat UA is free of blood.  There is still a chance of pathology; offered to refer to urology for full work-up.  At this time she declines but will ask for repeat UA the next time she is seen.  She would also like to see GYN to see if they have any non- surgical options for her rectal prolapse; I will make referral for her  Results for orders placed or performed in visit on 04/16/15  POCT UA - Microscopic Only  Result Value Ref Range   WBC, Ur, HPF, POC neg    RBC, urine, microscopic neg    Bacteria, U Microscopic neg    Mucus, UA neg    Epithelial cells, urine per micros neg    Crystals, Ur, HPF, POC neg    Casts, Ur, LPF, POC neg    Yeast, UA neg   POCT urinalysis dipstick  Result Value Ref Range   Color, UA yellow    Clarity, UA clear    Glucose, UA neg    Bilirubin, UA neg    Ketones, UA neg    Spec Grav, UA 1.010    Blood, UA neg    pH, UA 7.0    Protein, UA neg    Urobilinogen, UA 0.2    Nitrite, UA neg    Leukocytes, UA Negative

## 2015-04-28 ENCOUNTER — Ambulatory Visit (INDEPENDENT_AMBULATORY_CARE_PROVIDER_SITE_OTHER): Payer: Medicare Other | Admitting: Gynecology

## 2015-04-28 ENCOUNTER — Telehealth: Payer: Self-pay | Admitting: *Deleted

## 2015-04-28 ENCOUNTER — Encounter: Payer: Self-pay | Admitting: Gynecology

## 2015-04-28 VITALS — BP 124/76 | Ht 60.75 in | Wt 124.0 lb

## 2015-04-28 DIAGNOSIS — R159 Full incontinence of feces: Secondary | ICD-10-CM | POA: Insufficient documentation

## 2015-04-28 DIAGNOSIS — K623 Rectal prolapse: Secondary | ICD-10-CM

## 2015-04-28 NOTE — Telephone Encounter (Signed)
Per JF "please make appointment for this patient with Dr. Leighton Ruff colorectal surgeon for this patient with fecal incontinence and rectal prolapse."

## 2015-04-28 NOTE — Patient Instructions (Addendum)
Skin Care and Bowel Hygiene   Anyone who has frequent bowel movements, diarrhea, or bowel leakage (fecal incontinence) may experience soreness or skin irritation around the anal region.  Occasionally, the skin can become so inflamed that it breaks into open sores.  Prevent skin breakdown by following good skin care habits.  Cleaning and Washing Techniques After having a bowel movement, men and women should tighten their anal sphincter before wiping.  Women should always wipe from front to back to prevent fecal matter from getting into the urethra and vagina.   Tips for Cleaning and Washing . wipe from front to back towards the anus . always wipe gently with soft toilet paper, or ideally with moist toilet paper . wipe only once with each piece of toilet paper so as not to re-contaminate the area . wash in warm water alone or with a minimal amount of mild, fragrance-free soap . use non-biological washing powder . gently pat skin completely dry, avoiding rubbing . if drying the skin after washing is difficult or uncomfortable, try using a hairdryer on a low setting (use very carefully) . allow air to get to the irritated area for some part of every day . use protective skin creams containing zinc as recommended by your doctor  What To Avoid . baths with extra-hot water . soaking for long periods of time in the bathtub . disinfectants and antiseptics  . bath oils, bath salts, and talcum powder . using plastic pants, pads, and sheets, which cause sweating . scratching at the irritated area  Additional Tips . some people find that citrus and acidic foods cause or worsen skin irritation . eat a healthy, balanced diet that is high in fiber  . drink plenty of fluids . wear cotton underwear to allow the skin to breath . talk to your healthcare provider about further treatment options; persistent problems need medical attention   2007, [employer] Doc.25 Fecal Incontinence Fecal  incontinence is the inability to control your bowels. When you feel the urge to have a bowel movement, you may not be able to wait until you can get to a restroom. Stool may leak from the rectum unexpectedly. CAUSES   Constipation.  Damage to the nerves of the anal sphincter muscles or the rectum.  Diarrhea.  Damage to the anal sphincter muscles.  Loss of storage capacity in the rectum.  Pelvic floor dysfunction. DIAGNOSIS  Your caregiver will ask questions, do a physical exam, and possibly order other tests. These tests may include:  Anal manometry. This checks the anal sphincter tightness and its ability to respond to signals, as well as the sensitivity and function of the rectum.  Anorectal ultrasonography. This test evaluates the structure of the anal sphincters.  Proctography (also called defecography). This test shows how much stool the rectum can hold, how well the rectum holds it, and how well the rectum can get rid of the stool.  Proctosigmoidoscopy. This test allows caregivers to look inside the rectum for signs of disease or other problems that could cause fecal incontinence, such as inflammation, tumors, or scar tissue.  Anal electromyography. This tests for nerve damage. TREATMENT  Treatment depends on the cause and severity of fecal incontinence. It may include dietary changes, medicine, bowel training, or surgery. More than one treatment may be necessary for successful control. Treatment may include:  Adjusting what and how you eat.  Medicine to help you develop a more regular bowel pattern. Medicine may also be prescribed for diarrhea.  Bowel training to help you learn how to control your bowels (biofeedback). In some cases, it involves strengthening muscles. In others, it means training the bowels to empty at a specific time of day.  Surgery to repair damaged areas or to replace the anal muscle.  A colostomy if other treatments fail. This involves removing a  portion of the bowel. The remaining part is then attached to either the anus, or to a hole in the abdomen (stoma) through which stool leaves the body and is collected in a pouch. HOME CARE INSTRUCTIONS   Eat high fiber foods only if directed by your caregiver. Fiber adds bulk and makes stool easier to control. Oppositely, high fiber foods can act as a laxative and can make the problem worse.  Keep a food diary. List what you eat, how much you eat, and when you have an incontinent episode. After a few days, you may begin to see a pattern involving certain foods and incontinence. After you identify foods that seem to cause problems, cut back on them and see whether incontinence improves.  Avoid the following foods and drinks that often cause diarrhea:  Caffeine.  Spicy foods.  Fatty and greasy foods.  Dairy products (milk, cheese, and ice cream).  Cured or smoked meat like sausage, ham, or [place].  Alcohol.  Fruits like apples, peaches, or pears.  Ask your doctor if you need a vitamin supplement.  Drink enough water and fluids to keep your urine clear or pale yellow.  Wear cotton underwear and loose clothes that "breathe." Tight clothes that block air can worsen anal problems. Change soiled underwear as soon as possible.  Wash the anal area with water, not soap, after each bowel movement to help with anal discomfort. Use pre-moistened, alcohol-free wipes. Try using non-medicated talcum powder or corn starch to relieve anal discomfort.  Your caregiver may recommend an appropriate cream or ointment that can help prevent skin irritation from direct contact with stool.  Talk to your caregiver if you are having emotional distress. TIPS  Take a bag containing cleanup supplies and a change of clothing with you everywhere.  Locate public restrooms before you need them so you know where to go.  Use the toilet before heading out.  Wear disposable undergarments or sanitary pads if you  think an episode is likely.  Use oral fecal deodorants to add to your comfort level if episodes are frequent. FOR MORE INFORMATION  American Academy [employer]: www.AromatherapyParty.no International Foundation for Functional Gastrointestinal Disorders: www.iffgd.org Document Released: 11/16/2004 Document Revised: 02/27/2012 Document Reviewed: 04/12/2010 [facility] Patient Information [address], [place]. This information is not intended to replace advice given to you by your health care provider. Make sure you discuss any questions you have with your health care provider.

## 2015-04-28 NOTE — Telephone Encounter (Signed)
Appointment with Dr.Thomas on 05/20/15 @ 8:30am pt informed.

## 2015-04-28 NOTE — Progress Notes (Signed)
    Patient is a 69 year old gravida 1 para 0 AB 1 who was referred to our practice as a courtesy of Dr. Beckie Salts patient's PCP as a result of possible rectal prolapse. Patient reports that for many years she has had issues with controlling her bowel and has to wear a pad and has to be near a bathroom after she eats. This is affecting her quality of life now more than ever. She stated that she has seen a general surgeon over 3 years ago and had recommended surgery but she decided to wait until symptoms worsen. Patient has had no vaginal delivery one miscarriage. Patient many years ago was on hormone replacement therapy. Patient for many years was a smoker. Patient denies heavy lifting for many years in the past. She does take stool softeners at time for constipation. Her PCP is treating her for hypertension she has been treated in the past for positive PPD. She reports no hematochezia or we have she has to strain which is infrequent. She stated that approximately 3 years ago she had a colonoscopy attempted 2 times due to the fact that she was not cleaned completely for evaluation. She did state that many years ago she did have a benign colon polyp removed. She denies any family history of any colorectal cancer.  Exam: Blood pressure 124/76  weight 124 pounds  BMI 23.62 kg from meter square  height 5 feet 0.75 inches Gen. appearance: Well-developed Cheryl Kent female no acute distress Pelvic: Bartholin urethra Skene glands with some atrophic changes Vagina: No lesions or discharge. No cystocele, no rectocele., Mild atrophic changes Cervix: No lesions or discharge Bimanual exam: Uterus slightly anteverted normal size shape and consistency adnexa with no palpable masses or tenderness  Rectal exam: -Poor anal wink -On digital exam good sphincter tone in supine position but sphincter tone in the erect position felt weak and on Valsalva stool came out and rectal mucosa prolapse was palpated and  noted.  Assessment/plan: Fecal incontinence with rectal prolapse noted in the erect position. Patient will be referred to colorectal surgeon in our community for further evaluation to include the following possible studies: -Anal manometry -Anorectal manometry defecography -And or anal electromyography

## 2015-05-04 ENCOUNTER — Encounter: Payer: Self-pay | Admitting: *Deleted

## 2015-05-28 ENCOUNTER — Encounter: Payer: Self-pay | Admitting: Family Medicine

## 2015-06-09 ENCOUNTER — Other Ambulatory Visit: Payer: Self-pay | Admitting: Physician Assistant

## 2015-06-11 ENCOUNTER — Other Ambulatory Visit: Payer: Self-pay | Admitting: Family Medicine

## 2015-07-02 ENCOUNTER — Ambulatory Visit (INDEPENDENT_AMBULATORY_CARE_PROVIDER_SITE_OTHER): Payer: Medicare Other | Admitting: Emergency Medicine

## 2015-07-02 VITALS — BP 136/84 | HR 92 | Temp 99.6°F | Resp 20 | Ht 60.5 in | Wt 129.0 lb

## 2015-07-02 DIAGNOSIS — R509 Fever, unspecified: Secondary | ICD-10-CM

## 2015-07-02 DIAGNOSIS — B349 Viral infection, unspecified: Secondary | ICD-10-CM | POA: Diagnosis not present

## 2015-07-02 LAB — POCT URINALYSIS DIPSTICK
Bilirubin, UA: NEGATIVE
Blood, UA: NEGATIVE
GLUCOSE UA: NEGATIVE
Ketones, UA: NEGATIVE
Leukocytes, UA: NEGATIVE
NITRITE UA: NEGATIVE
PROTEIN UA: NEGATIVE
Spec Grav, UA: 1.01
UROBILINOGEN UA: 0.2
pH, UA: 7

## 2015-07-02 LAB — POCT CBC
Granulocyte percent: 64.1 %G (ref 37–80)
HEMATOCRIT: 44.4 % (ref 37.7–47.9)
Hemoglobin: 15.1 g/dL (ref 12.2–16.2)
LYMPH, POC: 2.1 (ref 0.6–3.4)
MCH, POC: 32.6 pg — AB (ref 27–31.2)
MCHC: 34.1 g/dL (ref 31.8–35.4)
MCV: 95.6 fL (ref 80–97)
MID (cbc): 0.6 (ref 0–0.9)
MPV: 6 fL (ref 0–99.8)
POC Granulocyte: 4.7 (ref 2–6.9)
POC LYMPH PERCENT: 28.2 %L (ref 10–50)
POC MID %: 7.7 %M (ref 0–12)
Platelet Count, POC: 343 10*3/uL (ref 142–424)
RBC: 4.64 M/uL (ref 4.04–5.48)
RDW, POC: 12.1 %
WBC: 7.3 10*3/uL (ref 4.6–10.2)

## 2015-07-02 LAB — POCT UA - MICROSCOPIC ONLY
BACTERIA, U MICROSCOPIC: NEGATIVE
CRYSTALS, UR, HPF, POC: NEGATIVE
Casts, Ur, LPF, POC: NEGATIVE
MUCUS UA: NEGATIVE
RBC, urine, microscopic: NEGATIVE
WBC, Ur, HPF, POC: NEGATIVE
YEAST UA: NEGATIVE

## 2015-07-02 NOTE — Progress Notes (Signed)
Subjective:  Patient ID: Cheryl Kent, female    DOB: 03-29-1946  Age: 69 y.o. MRN: [id number]  CC: Fever   HPI Cheryl Kent presents  with a fever. She said she's had fever and myalgias and fatigue for most of the week. She has no nausea vomiting cough coryza. Noted stool change no rash. Has not noted dysuria urgency or frequency. Has no vaginal discharge. She has no nasal congestion. No sore throat. Her temperature is coming down from  around the 100 to normal. And she is concerned that she may have an infection. She's had some relief of her fever with over-the-counter medication.  History Cariann has a past medical history of Arthritis; Substance abuse; Cataract; Anxiety; Tuberculosis (2012); Allergy; GERD (gastroesophageal reflux disease); Positive QuantiFERON-TB Gold test; and Hypertension.   She has past surgical history that includes Tonsillectomy and adenoidectomy; Breast surgery; Tubal ligation; Shoulder surgery; Hand surgery; Foot surgery; Eye surgery (Left, 10/07/2014); and Eye surgery (Right, 11/18/2014).   Her  family history includes Alzheimer's disease in her father and paternal grandfather; Cancer in her cousin, father, and sister; Diabetes in her father; Hyperlipidemia in her mother and sister; Hypertension in her father, mother, and sister; Kidney disease in her sister; Mental illness in her mother and sister; Osteoporosis in her maternal grandmother, mother, paternal grandmother, and sister; Stroke in her maternal grandmother, mother, and sister.  She   reports that she quit smoking about 31 years ago. Her smoking use included Cigarettes. She has a 37.5 pack-year smoking history. She has never used smokeless tobacco. She reports that she does not drink alcohol or use illicit drugs.  Outpatient Prescriptions Prior to Visit  Medication Sig Dispense Refill  . aspirin 81 MG tablet Take 81 mg by mouth daily.      . Calcium Carbonate-Vitamin D (CALCIUM + D PO) Take by mouth  daily.    . Cholecalciferol (VITAMIN D) 2000 UNITS CAPS Take by mouth daily.    . Coenzyme Q10 (COQ10) 100 MG CAPS Take by mouth daily.    . Lactobacillus (DIGESTIVE HEALTH PROBIOTIC PO) Take by mouth. Digestive Advantage 1 gummie chewed daily.    Marland Kitchen lisinopril-hydrochlorothiazide (PRINZIDE,ZESTORETIC) 20-25 MG per tablet Take 1 tablet by mouth daily. 90 tablet 3  . meloxicam (MOBIC) 15 MG tablet Take 1 tablet (15 mg total) by mouth daily. 30 tablet 2  . Multiple Vitamin (MULTIVITAMIN) tablet Take 1 tablet by mouth daily.    . Omega-3 Fatty Acids (FISH OIL) 1000 MG CAPS Take by mouth daily.    . polyethylene glycol powder (GLYCOLAX/MIRALAX) powder Take 17 g by mouth daily. Use as needed for constipation 850 g 3  . polyethylene glycol powder (GLYCOLAX/MIRALAX) powder MIX 17 GRAMS AS DIRECTED AND DRINK BY MOUTH DAILY AS NEEDED FOR CONSTIPATION 850 g 1  . ranitidine (ZANTAC) 150 MG tablet Take 150 mg by mouth at bedtime.    . rosuvastatin (CRESTOR) 10 MG tablet Take 1 tablet (10 mg total) by mouth daily. 30 tablet 11  . traMADol (ULTRAM) 50 MG tablet TAKE 1 TABLE BY MOUTH EVERY 12 HOURS AS NEEDED 100 tablet 2  . traZODone (DESYREL) 150 MG tablet Take 1 tablet (150 mg total) by mouth at bedtime. 30 tablet 11  . ciprofloxacin (CIPRO) 250 MG tablet Take 1 tablet (250 mg total) by mouth 2 (two) times daily. (Patient not taking: Reported on 04/28/2015) 10 tablet 0  . oseltamivir (TAMIFLU) 30 MG capsule Take 1 capsule (30 mg total) by mouth 2 (  two) times daily. (Patient not taking: Reported on 04/28/2015) 10 capsule 0   No facility-administered medications prior to visit.    History   Social History  . Marital Status: Widowed    Spouse Name: N/A  . Number of Children: N/A  . Years of Education: N/A   Social History Main Topics  . Smoking status: Former Smoker -- 1.50 packs/day for 25 years    Types: Cigarettes    Quit date: 12/20/1983  . Smokeless tobacco: Never Used  . Alcohol Use: No      Comment: former use quit in 1980 with treatment  . Drug Use: No  . Sexual Activity: No   Other Topics Concern  . None   Social History Narrative   Widowed;   Exercise cardio,weights, and abs 5 x a week for 1 hour     Review of Systems  Constitutional: Positive for fever and chills. Negative for appetite change.  HENT: Negative for congestion, ear pain, postnasal drip, sinus pressure and sore throat.   Eyes: Negative for pain and redness.  Respiratory: Negative for cough, shortness of breath and wheezing.   Cardiovascular: Negative for leg swelling.  Gastrointestinal: Negative for nausea, vomiting, abdominal pain, diarrhea, constipation and blood in stool.  Endocrine: Negative for polyuria.  Genitourinary: Positive for urgency and frequency. Negative for dysuria and flank pain.  Musculoskeletal: Negative for gait problem.  Skin: Negative for rash.  Neurological: Negative for weakness and headaches.  Psychiatric/Behavioral: Negative for confusion and decreased concentration. The patient is not nervous/anxious.     Objective:  BP 136/84 mmHg  Pulse 92  Temp(Src) 99.6 F (37.6 C) (Oral)  Resp 20  Ht 5' 0.5" (1.537 m)  Wt 129 lb (58.514 kg)  BMI 24.77 kg/m2  SpO2 99%  Physical Exam  Constitutional: She is oriented to person, place, and time. She appears well-developed and well-nourished. No distress.  HENT:  Head: Normocephalic and atraumatic.  Right Ear: External ear normal.  Left Ear: External ear normal.  Nose: Nose normal.  Eyes: Conjunctivae and EOM are normal. Pupils are equal, round, and reactive to light. No scleral icterus.  Neck: Normal range of motion. Neck supple. No tracheal deviation present.  Cardiovascular: Normal rate, regular rhythm and normal heart sounds.   Pulmonary/Chest: Effort normal. No respiratory distress. She has no wheezes. She has no rales.  Abdominal: She exhibits no mass. There is no tenderness. There is no rebound and no guarding.    Musculoskeletal: She exhibits no edema.  Lymphadenopathy:    She has no cervical adenopathy.  Neurological: She is alert and oriented to person, place, and time. Coordination normal.  Skin: Skin is warm and dry. No rash noted.  Psychiatric: She has a normal mood and affect. Her behavior is normal.      Assessment & Plan:   Ginia was seen today for fever.  Diagnoses and all orders for this visit:  Fever, unspecified fever cause Orders: -     POCT urinalysis dipstick -     POCT UA - Microscopic Only -     POCT CBC   I am having Ms. Cremeens maintain her aspirin, multivitamin, Vitamin D, Calcium Carbonate-Vitamin D (CALCIUM + D PO), CoQ10, Fish Oil, ranitidine, meloxicam, traMADol, traZODone, polyethylene glycol powder, Lactobacillus (DIGESTIVE HEALTH PROBIOTIC PO), lisinopril-hydrochlorothiazide, rosuvastatin, oseltamivir, ciprofloxacin, and polyethylene glycol powder.  No orders of the defined types were placed in this encounter.   She was reassured that her pressure was normal as is her white count  a urine test.  Appropriate red flag conditions were discussed with the patient as well as actions that should be taken.  Patient expressed his understanding.  Follow-up: No Follow-up on file.  Roselee Culver, MD   Results for orders placed or performed in visit on 07/02/15  POCT urinalysis dipstick  Result Value Ref Range   Color, UA YELLOW    Clarity, UA CLEAR    Glucose, UA NEG    Bilirubin, UA NEG    Ketones, UA NEG    Spec Grav, UA 1.010    Blood, UA NEG    pH, UA 7.0    Protein, UA NEG    Urobilinogen, UA 0.2    Nitrite, UA NEG    Leukocytes, UA Negative Negative  POCT UA - Microscopic Only  Result Value Ref Range   WBC, Ur, HPF, POC NEG    RBC, urine, microscopic NEG    Bacteria, U Microscopic NEG    Mucus, UA NEG    Epithelial cells, urine per micros 0-1    Crystals, Ur, HPF, POC NEG    Casts, Ur, LPF, POC NEG    Yeast, UA NEG   POCT CBC  Result  Value Ref Range   WBC 7.3 4.6 - 10.2 K/uL   Lymph, poc 2.1 0.6 - 3.4   POC LYMPH PERCENT 28.2 10 - 50 %L   MID (cbc) 0.6 0 - 0.9   POC MID % 7.7 0 - 12 %M   POC Granulocyte 4.7 2 - 6.9   Granulocyte percent 64.1 37 - 80 %G   RBC 4.64 4.04 - 5.48 M/uL   Hemoglobin 15.1 12.2 - 16.2 g/dL   HCT, POC 44.4 37.7 - 47.9 %   MCV 95.6 80 - 97 fL   MCH, POC 32.6 (A) 27 - 31.2 pg   MCHC 34.1 31.8 - 35.4 g/dL   RDW, POC 12.1 %   Platelet Count, POC 343 142 - 424 K/uL   MPV 6.0 0 - 99.8 fL

## 2015-07-02 NOTE — Patient Instructions (Signed)

## 2015-08-19 ENCOUNTER — Ambulatory Visit (HOSPITAL_BASED_OUTPATIENT_CLINIC_OR_DEPARTMENT_OTHER): Admission: RE | Admit: 2015-08-19 | Payer: Self-pay | Source: Ambulatory Visit | Admitting: General Surgery

## 2015-08-19 ENCOUNTER — Encounter (HOSPITAL_BASED_OUTPATIENT_CLINIC_OR_DEPARTMENT_OTHER): Admission: RE | Payer: Self-pay | Source: Ambulatory Visit

## 2015-08-19 SURGERY — REPAIR, PROLAPSE, RECTUM
Anesthesia: Monitor Anesthesia Care

## 2015-08-21 ENCOUNTER — Encounter: Payer: Self-pay | Admitting: Family Medicine

## 2015-08-21 ENCOUNTER — Ambulatory Visit (INDEPENDENT_AMBULATORY_CARE_PROVIDER_SITE_OTHER): Payer: Medicare Other | Admitting: Family Medicine

## 2015-08-21 VITALS — BP 121/77 | HR 84 | Temp 98.7°F | Resp 16 | Ht 61.0 in | Wt 127.4 lb

## 2015-08-21 DIAGNOSIS — K623 Rectal prolapse: Secondary | ICD-10-CM

## 2015-08-21 DIAGNOSIS — F411 Generalized anxiety disorder: Secondary | ICD-10-CM | POA: Diagnosis not present

## 2015-08-21 DIAGNOSIS — G4700 Insomnia, unspecified: Secondary | ICD-10-CM

## 2015-08-21 DIAGNOSIS — R002 Palpitations: Secondary | ICD-10-CM | POA: Diagnosis not present

## 2015-08-21 DIAGNOSIS — E7800 Pure hypercholesterolemia, unspecified: Secondary | ICD-10-CM

## 2015-08-21 DIAGNOSIS — F1921 Other psychoactive substance dependence, in remission: Secondary | ICD-10-CM

## 2015-08-21 DIAGNOSIS — K219 Gastro-esophageal reflux disease without esophagitis: Secondary | ICD-10-CM

## 2015-08-21 DIAGNOSIS — IMO0002 Reserved for concepts with insufficient information to code with codable children: Secondary | ICD-10-CM

## 2015-08-21 DIAGNOSIS — F1020 Alcohol dependence, uncomplicated: Secondary | ICD-10-CM | POA: Diagnosis not present

## 2015-08-21 DIAGNOSIS — I6521 Occlusion and stenosis of right carotid artery: Secondary | ICD-10-CM | POA: Diagnosis not present

## 2015-08-21 DIAGNOSIS — I10 Essential (primary) hypertension: Secondary | ICD-10-CM | POA: Diagnosis not present

## 2015-08-21 DIAGNOSIS — R159 Full incontinence of feces: Secondary | ICD-10-CM | POA: Diagnosis not present

## 2015-08-21 DIAGNOSIS — K5901 Slow transit constipation: Secondary | ICD-10-CM | POA: Diagnosis not present

## 2015-08-21 DIAGNOSIS — F1021 Alcohol dependence, in remission: Secondary | ICD-10-CM

## 2015-08-21 LAB — CBC WITH DIFFERENTIAL/PLATELET
BASOS PCT: 1 % (ref 0–1)
Basophils Absolute: 0.1 10*3/uL (ref 0.0–0.1)
EOS ABS: 0.2 10*3/uL (ref 0.0–0.7)
EOS PCT: 3 % (ref 0–5)
HCT: 42 % (ref 36.0–46.0)
HEMOGLOBIN: 14.3 g/dL (ref 12.0–15.0)
Lymphocytes Relative: 32 % (ref 12–46)
Lymphs Abs: 1.9 10*3/uL (ref 0.7–4.0)
MCH: 33.9 pg (ref 26.0–34.0)
MCHC: 34 g/dL (ref 30.0–36.0)
MCV: 99.5 fL (ref 78.0–100.0)
MONO ABS: 0.6 10*3/uL (ref 0.1–1.0)
MONOS PCT: 10 % (ref 3–12)
MPV: 8.8 fL (ref 8.6–12.4)
NEUTROS ABS: 3.2 10*3/uL (ref 1.7–7.7)
Neutrophils Relative %: 54 % (ref 43–77)
PLATELETS: 314 10*3/uL (ref 150–400)
RBC: 4.22 MIL/uL (ref 3.87–5.11)
RDW: 12.3 % (ref 11.5–15.5)
WBC: 5.9 10*3/uL (ref 4.0–10.5)

## 2015-08-21 LAB — COMPREHENSIVE METABOLIC PANEL
ALT: 22 U/L (ref 6–29)
AST: 31 U/L (ref 10–35)
Albumin: 4.2 g/dL (ref 3.6–5.1)
Alkaline Phosphatase: 48 U/L (ref 33–130)
BILIRUBIN TOTAL: 0.5 mg/dL (ref 0.2–1.2)
BUN: 14 mg/dL (ref 7–25)
CHLORIDE: 102 mmol/L (ref 98–110)
CO2: 31 mmol/L (ref 20–31)
CREATININE: 0.79 mg/dL (ref 0.60–0.93)
Calcium: 9.5 mg/dL (ref 8.6–10.4)
GLUCOSE: 102 mg/dL — AB (ref 65–99)
Potassium: 4 mmol/L (ref 3.5–5.3)
SODIUM: 144 mmol/L (ref 135–146)
Total Protein: 6.2 g/dL (ref 6.1–8.1)

## 2015-08-21 LAB — LIPID PANEL
Cholesterol: 228 mg/dL — ABNORMAL HIGH (ref 125–200)
HDL: 115 mg/dL (ref >=46)
LDL CALC: 102 mg/dL (ref <130)
TRIGLYCERIDES: 55 mg/dL (ref <150)
Total CHOL/HDL Ratio: 2 Ratio (ref <=5.0)
VLDL: 11 mg/dL (ref <30)

## 2015-08-21 MED ORDER — POLYETHYLENE GLYCOL 3350 17 GM/SCOOP PO POWD: 17.0000 g | Freq: Every day | ORAL | Status: DC

## 2015-08-21 MED ORDER — ROSUVASTATIN CALCIUM 10 MG PO TABS: 10.0000 mg | ORAL_TABLET | Freq: Every day | ORAL | Status: DC

## 2015-08-21 MED ORDER — MELOXICAM 15 MG PO TABS: 15.0000 mg | ORAL_TABLET | Freq: Every day | ORAL | Status: DC

## 2015-08-21 MED ORDER — LISINOPRIL-HYDROCHLOROTHIAZIDE 20-25 MG PO TABS: 1.0000 | ORAL_TABLET | Freq: Every day | ORAL | Status: DC

## 2015-08-21 MED ORDER — TRAZODONE HCL 150 MG PO TABS: 150.0000 mg | ORAL_TABLET | Freq: Every day | ORAL | Status: DC

## 2015-08-21 MED ORDER — TRAMADOL HCL 50 MG PO TABS: ORAL_TABLET | ORAL | Status: DC

## 2015-08-21 NOTE — Progress Notes (Signed)
Subjective:    Patient ID: Cheryl Kent, female    DOB: [birth year], 69 y.o.   MRN: [id number]  08/21/2015  Follow-up; Hyperlipidemia; and Hypertension   HPI This 69 y.o. female presents for six month follow-up and to establish with new provider. Previous patient of Dr. Leward Quan.  1. HTN: .Patient reports good compliance with medication, good tolerance to medication, and good symptom control.    2. Hyperlipidemia: Patient reports good compliance with medication, good tolerance to medication, and good symptom control.    3.  Generalized anxiety disorder: stable; currently no medication for anxiety; maintained on Trazodone for insomnia and sleeping well.  Exercises regularly.  A loner so prefers to do things alone.   Last [facility] admission in 1979; just alcoholism at that point.  Prescription drug abuse in 1970s.    4. Health Maintenance: Last physical: 03-12-2015 Pap smear: n/a; saw Dr. Toney Rakes recently for rectal prolapse. S/p pelvic exam.  Mammogram: 03-31-15 Colonoscopy:  01/05/2012; had two colonoscopies three months apart without good prep; repeat in ten years. Bone density:  2013; thinks in 2016. TDAP: 2012 Pneumovax:  2008 with local reaction Zostavax:  2008 Influenza: 2015; at [facility]. Eye exam: +glasses; cataract surgery B last year; 2016. Might develop glaucoma.   Dental exam:     5.  Rectal prolapse and incontinence: referred to Leighton Ruff; discussed surgical procedure but cancelled surgery.   Lives alone and not sure about recovery. No local family to help.  Sister died of leukemia in Jan 04, 2015.  Recovering alcoholic; sober since 1308.  Quit smoking 30 years.  Takes Tramadol for rectal prolapse twice weekly.   6. Rosacea conjunctivitis:  Diagnosed recently.  Recommended Doxycycline low dose but pt hesitant.  7. GERD: stopped Nexium due to side effects; switched to Zantac 75mg  daily and doing well.   8. Constipation: chronic issue for patient; using Miralax  as needed.  Usually must take 1-2 times per week.  9. Palpitations: chronic; has a heart flutter about every two weeks; lots of friends with atrial fibrillation; denies associated chest pain, SOB, leg swelling, or near syncope.    10.  R carotid stenosis:  Diagnosed 3-4 years ago with Life screening; also diagnosed with mild to moderate stenosis on previous Life screening; no formal evaluation in past.     Review of Systems  Constitutional: Negative for fever, chills, diaphoresis and fatigue.  Eyes: Negative for visual disturbance.  Respiratory: Negative for cough and shortness of breath.   Cardiovascular: Positive for palpitations. Negative for chest pain and leg swelling.  Gastrointestinal: Positive for rectal pain. Negative for nausea, vomiting, abdominal pain, diarrhea, constipation, blood in stool and anal bleeding.  Endocrine: Negative for cold intolerance, heat intolerance, polydipsia, polyphagia and polyuria.  Neurological: Negative for dizziness, tremors, seizures, syncope, facial asymmetry, speech difficulty, weakness, light-headedness, numbness and headaches.  Psychiatric/Behavioral: Positive for sleep disturbance. Negative for suicidal ideas, self-injury and dysphoric mood. The patient is not nervous/anxious.     Past Medical History  Diagnosis Date  . Substance abuse   . Cataract   . Anxiety   . Tuberculosis 2012  . GERD (gastroesophageal reflux disease)   . 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  2012.  Marland Kitchen Hypertension   . Rosacea conjunctivitis   . Allergy     previous allergy shots.    . Arthritis     DDD lumbar spine, hands B   Past Surgical History  Procedure Laterality Date  .  Tonsillectomy and adenoidectomy      childhood  . Breast surgery      #1 age 38 yo  #2 age 23's  . Tubal ligation    . Shoulder surgery    . Hand surgery    . Foot surgery    . Eye surgery Left 10/07/2014    Cataract removal- Dr. Herbert Deaner  .  Eye surgery Right 11/18/2014    Cataract removal- Dr. Herbert Deaner   Allergies  Allergen Reactions  . Penicillins Other (See Comments)    infant  . Pneumococcal Vaccines   . Sulfamethoxazole-Trimethoprim   . Sulfonamide Derivatives    Current Outpatient Prescriptions  Medication Sig Dispense Refill  . aspirin 81 MG tablet Take 81 mg by mouth daily.      . Calcium Carbonate-Vitamin D (CALCIUM + D PO) Take by mouth daily.    . Cholecalciferol (VITAMIN D) 2000 UNITS CAPS Take by mouth daily.    . Coenzyme Q10 (COQ10) 100 MG CAPS Take by mouth daily.    . Lactobacillus (DIGESTIVE HEALTH PROBIOTIC PO) Take by mouth. Digestive Advantage 1 gummie chewed daily.    Marland Kitchen lisinopril-hydrochlorothiazide (PRINZIDE,ZESTORETIC) 20-25 MG per tablet Take 1 tablet by mouth daily. 90 tablet 3  . meloxicam (MOBIC) 15 MG tablet Take 1 tablet (15 mg total) by mouth daily. 90 tablet 1  . Multiple Vitamin (MULTIVITAMIN) tablet Take 1 tablet by mouth daily.    . Omega-3 Fatty Acids (FISH OIL) 1000 MG CAPS Take by mouth daily.    . polyethylene glycol powder (GLYCOLAX/MIRALAX) powder Take 17 g by mouth daily. Use as needed for constipation 850 g 3  . ranitidine (ZANTAC) 150 MG tablet Take 75 mg by mouth at bedtime.     . rosuvastatin (CRESTOR) 10 MG tablet Take 1 tablet (10 mg total) by mouth daily. 30 tablet 11  . traMADol (ULTRAM) 50 MG tablet TAKE 1 TABLE BY MOUTH EVERY 12 HOURS AS NEEDED 100 tablet 2  . traZODone (DESYREL) 150 MG tablet Take 1 tablet (150 mg total) by mouth at bedtime. 30 tablet 11   No current facility-administered medications for this visit.   Social History   Social History  . Marital Status: Widowed    Spouse Name: N/A  . Number of Children: N/A  . Years of Education: N/A   Occupational History  . Not on file.   Social History Main Topics  . Smoking status: Former Smoker -- 1.50 packs/day for 25 years    Types: Cigarettes    Quit date: 12/20/1983  . Smokeless tobacco: Never Used    . Alcohol Use: No     Comment: former use quit in 1980 with treatment  . Drug Use: No  . Sexual Activity: No   Other Topics Concern  . Not on file   Social History Narrative   Marital status:  Widowed since 2007.  Widowed twice.      Children: none      Lives: alone in house.      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 Stroke Mother 66    multiple CVAs  . Hypertension  Mother   . Hyperlipidemia Mother   . Osteoporosis Mother   . Mental illness Mother     paranoid schizophrenia  . Alzheimer's disease Father   . Diabetes Father   . Hypertension Father   . Cancer Father     prostate  . Osteoporosis Sister   . Kidney disease Sister   . Stroke Sister   . Hyperlipidemia Sister   . Hypertension Sister   . Mental illness Sister     depression; multiple behavior health admissions  . Cancer Sister     leukemia  . Osteoporosis Maternal Grandmother   . Stroke Maternal Grandmother   . Osteoporosis Paternal Grandmother   . Alzheimer's disease Paternal Grandfather   . Cancer Cousin     MATERNAL IST COUSIN - BREAST CA       Objective:    BP 121/77 mmHg  Pulse 84  Temp(Src) 98.7 F (37.1 C) (Oral)  Resp 16  Ht 5\' 1"  (1.549 m)  Wt 127 lb 6.4 oz (57.788 kg)  BMI 24.08 kg/m2  SpO2 97% Physical Exam  Constitutional: She is oriented to person, place, and time. She appears well-developed and well-nourished. No distress.  HENT:  Head: Normocephalic and atraumatic.  Right Ear: External ear normal.  Left Ear: External ear normal.  Nose: Nose normal.  Mouth/Throat: Oropharynx is clear and moist.  Eyes: Conjunctivae and EOM are normal. Pupils are equal, round, and reactive to light.  Neck: Normal range of motion.  Neck supple. Carotid bruit is not present. No thyromegaly present.  Cardiovascular: Normal rate, regular rhythm, normal heart sounds and intact distal pulses.  Exam reveals no gallop and no friction rub.   No murmur heard. Pulmonary/Chest: Effort normal and breath sounds normal. She has no wheezes. She has no rales.  Abdominal: Soft. Bowel sounds are normal. She exhibits no distension and no mass. There is no tenderness. There is no rebound and no guarding.  Lymphadenopathy:    She has no cervical adenopathy.  Neurological: She is alert and oriented to person, place, and time. No cranial nerve deficit.  Skin: Skin is warm and dry. No rash noted. She is not diaphoretic. No erythema. No pallor.  Psychiatric: She has a normal mood and affect. Her behavior is normal.   EKG: NSR; no ST changes; no arrhythmia.      Assessment & Plan:   1. Hypercholesteremia   2. Essential hypertension   3. Gastroesophageal reflux disease without esophagitis   4. GAD (generalized anxiety disorder)   5. Palpitations   6. Carotid stenosis, right     1. Hyperlipidemia: controlled; obtain labs; continue Crestor. 2.  HTN: controlled; obtain labs; continue current medications. 3.  GERD: controlled with Zantac once daily. 4.  GAD: stable; no medication currently. 5. Insomnia: controlled with Trazodone qhs. 6.  Palpitations: New; refer to cardiology for Event monitor. 7.  Carotid stenosis R: New. Detected on Life screening four years ago; refer to cardiology for carotid doppler. 8. Rectal prolapse with fecal incontinence: Chronic; s/p gynecological and general surgery consultation; surgery recommended but pt declined; using Tramadol sparingly for rectal pain; refill provided.   Orders Placed This Encounter  Procedures  . CBC with Differential/Platelet  . Comprehensive metabolic panel    Order Specific Question:  Has the patient fasted?    Answer:  Yes  . Lipid panel    Order Specific Question:  Has the  patient fasted?    Answer:  Yes  . Ambulatory referral to Cardiology  Referral Priority:  Routine    Referral Type:  Consultation    Referral Reason:  Specialty Services Required    Requested Specialty:  Cardiology    Number of Visits Requested:  1  . EKG 12-Lead   Meds ordered this encounter  Medications  . lisinopril-hydrochlorothiazide (PRINZIDE,ZESTORETIC) 20-25 MG per tablet    Sig: Take 1 tablet by mouth daily.    Dispense:  90 tablet    Refill:  3  . meloxicam (MOBIC) 15 MG tablet    Sig: Take 1 tablet (15 mg total) by mouth daily.    Dispense:  90 tablet    Refill:  1  . polyethylene glycol powder (GLYCOLAX/MIRALAX) powder    Sig: Take 17 g by mouth daily. Use as needed for constipation    Dispense:  850 g    Refill:  3  . rosuvastatin (CRESTOR) 10 MG tablet    Sig: Take 1 tablet (10 mg total) by mouth daily.    Dispense:  30 tablet    Refill:  11  . traZODone (DESYREL) 150 MG tablet    Sig: Take 1 tablet (150 mg total) by mouth at bedtime.    Dispense:  30 tablet    Refill:  11  . traMADol (ULTRAM) 50 MG tablet    Sig: TAKE 1 TABLE BY MOUTH EVERY 12 HOURS AS NEEDED    Dispense:  100 tablet    Refill:  2    Return in about 6 months (around 02/18/2016) for complete physical examiniation.   Kristi Elayne Guerin, M.D. [facility] [address] [place], [place]  [address] [phone] phone [phone] fax

## 2015-08-22 ENCOUNTER — Encounter: Payer: Self-pay | Admitting: Family Medicine

## 2015-08-22 DIAGNOSIS — IMO0002 Reserved for concepts with insufficient information to code with codable children: Secondary | ICD-10-CM | POA: Insufficient documentation

## 2015-08-22 DIAGNOSIS — K5901 Slow transit constipation: Secondary | ICD-10-CM | POA: Insufficient documentation

## 2015-08-22 DIAGNOSIS — G4700 Insomnia, unspecified: Secondary | ICD-10-CM | POA: Insufficient documentation

## 2015-08-22 DIAGNOSIS — F1021 Alcohol dependence, in remission: Secondary | ICD-10-CM | POA: Insufficient documentation

## 2015-08-22 DIAGNOSIS — K623 Rectal prolapse: Secondary | ICD-10-CM | POA: Insufficient documentation

## 2015-08-26 ENCOUNTER — Other Ambulatory Visit: Payer: Self-pay | Admitting: *Deleted

## 2015-08-26 DIAGNOSIS — E7800 Pure hypercholesterolemia, unspecified: Secondary | ICD-10-CM

## 2015-08-26 DIAGNOSIS — R0989 Other specified symptoms and signs involving the circulatory and respiratory systems: Secondary | ICD-10-CM

## 2015-09-01 ENCOUNTER — Ambulatory Visit (HOSPITAL_COMMUNITY)
Admission: RE | Admit: 2015-09-01 | Discharge: 2015-09-01 | Disposition: A | Discharge status: Home / Self Care | Payer: Medicare Other | Source: Ambulatory Visit | Attending: Cardiology | Admitting: Cardiology

## 2015-09-01 ENCOUNTER — Other Ambulatory Visit: Payer: Self-pay | Admitting: Family Medicine

## 2015-09-01 DIAGNOSIS — I10 Essential (primary) hypertension: Secondary | ICD-10-CM | POA: Diagnosis not present

## 2015-09-01 DIAGNOSIS — I6521 Occlusion and stenosis of right carotid artery: Secondary | ICD-10-CM

## 2015-09-01 DIAGNOSIS — F17200 Nicotine dependence, unspecified, uncomplicated: Secondary | ICD-10-CM | POA: Diagnosis not present

## 2015-09-01 DIAGNOSIS — E785 Hyperlipidemia, unspecified: Secondary | ICD-10-CM | POA: Insufficient documentation

## 2015-09-07 ENCOUNTER — Encounter: Payer: Self-pay | Admitting: Family Medicine

## 2015-09-07 ENCOUNTER — Ambulatory Visit (INDEPENDENT_AMBULATORY_CARE_PROVIDER_SITE_OTHER): Payer: Medicare Other | Admitting: Family Medicine

## 2015-09-07 VITALS — BP 132/72 | HR 79 | Temp 97.8°F | Resp 16 | Ht 61.0 in | Wt 129.2 lb

## 2015-09-07 DIAGNOSIS — K5901 Slow transit constipation: Secondary | ICD-10-CM | POA: Diagnosis not present

## 2015-09-07 DIAGNOSIS — Z23 Encounter for immunization: Secondary | ICD-10-CM | POA: Diagnosis not present

## 2015-09-07 DIAGNOSIS — K623 Rectal prolapse: Secondary | ICD-10-CM

## 2015-09-07 DIAGNOSIS — F1020 Alcohol dependence, uncomplicated: Secondary | ICD-10-CM | POA: Diagnosis not present

## 2015-09-07 DIAGNOSIS — R159 Full incontinence of feces: Secondary | ICD-10-CM

## 2015-09-07 DIAGNOSIS — R739 Hyperglycemia, unspecified: Secondary | ICD-10-CM | POA: Diagnosis not present

## 2015-09-07 DIAGNOSIS — F1021 Alcohol dependence, in remission: Secondary | ICD-10-CM

## 2015-09-07 NOTE — Patient Instructions (Signed)

## 2015-09-07 NOTE — Progress Notes (Signed)
Subjective:    Patient ID: Cheryl Kent, female    DOB: 09-02-1946, 69 y.o.   MRN: [id number]  09/07/2015  Referral   HPI This 69 y.o. female presents for evaluation of rectal prolapse with fecal incontinence.  Discussed rectal prolapse with physical therapist at [facility].  Discussed with physical therapist to consider [facility] physical therapist named Ruben Gottron.  Does core exercises daily.  With exercise, rectum prolapse out so is bothersome.   Pt has the appointment on September 21, 2015 at 2:00pm.  Fax [phone].  Carotid stenosis: s/p doppler; minimal stenosis.   Mother with carotid stenosis.   Referred to Romie Levee in past year; discussed surgical procedure but cancelled surgery. Lives alone and not sure about recovery. No local family to help. Sister died of leukemia in 01/31/2015. Recovering alcoholic; sober since 1980. Quit smoking 30 years. Takes Tramadol for rectal prolapse twice weekly.   Prevnar 13: agreeable; had local reaction to Pneumovax.   Review of Systems  Constitutional: Negative for fever, chills, diaphoresis and fatigue.  Eyes: Negative for visual disturbance.  Respiratory: Negative for cough and shortness of breath.   Cardiovascular: Negative for chest pain, palpitations and leg swelling.  Gastrointestinal: Positive for rectal pain. Negative for nausea, vomiting, abdominal pain, diarrhea, constipation, blood in stool and anal bleeding.  Endocrine: Negative for cold intolerance, heat intolerance, polydipsia, polyphagia and polyuria.  Neurological: Negative for dizziness, tremors, seizures, syncope, facial asymmetry, speech difficulty, weakness, light-headedness, numbness and headaches.    Past Medical History  Diagnosis Date  . Substance abuse   . Cataract   . Anxiety   . Tuberculosis 2012  . GERD (gastroesophageal reflux disease)   . Positive QuantiFERON-TB Gold test 12/19/2010    Snider/ID; treated three months.  2012.  Marland Kitchen Hypertension     . Rosacea conjunctivitis   . Allergy     previous allergy shots.    . Arthritis     DDD lumbar spine, hands B   Past Surgical History  Procedure Laterality Date  . Tonsillectomy and adenoidectomy      childhood  . Breast surgery      #1 age 1 yo  #2 age 41's  . Tubal ligation    . Shoulder surgery    . Hand surgery    . Foot surgery    . Eye surgery Left 10/07/2014    Cataract removal- Dr. Elmer Picker  . Eye surgery Right 11/18/2014    Cataract removal- Dr. Elmer Picker   Allergies  Allergen Reactions  . Penicillins Other (See Comments)    infant  . Pneumococcal Vaccines   . Sulfamethoxazole-Trimethoprim   . Sulfonamide Derivatives    Current Outpatient Prescriptions  Medication Sig Dispense Refill  . aspirin 81 MG tablet Take 81 mg by mouth daily.      . Cholecalciferol (VITAMIN D) 2000 UNITS CAPS Take by mouth daily.    . Coenzyme Q10 (COQ10) 100 MG CAPS Take by mouth daily.    . Lactobacillus (DIGESTIVE HEALTH PROBIOTIC PO) Take by mouth. Digestive Advantage 1 gummie chewed daily.    Marland Kitchen lisinopril-hydrochlorothiazide (PRINZIDE,ZESTORETIC) 20-25 MG per tablet Take 1 tablet by mouth daily. 90 tablet 3  . Magnesium 300 MG CAPS Take by mouth daily.    . meloxicam (MOBIC) 15 MG tablet Take 1 tablet (15 mg total) by mouth daily. 90 tablet 1  . Multiple Vitamin (MULTIVITAMIN) tablet Take 1 tablet by mouth daily.    . Omega-3 Fatty Acids (FISH OIL) 1000 MG  CAPS Take by mouth daily.    . polyethylene glycol powder (GLYCOLAX/MIRALAX) powder Take 17 g by mouth daily. Use as needed for constipation 850 g 3  . ranitidine (ZANTAC) 150 MG tablet Take 75 mg by mouth at bedtime.     . rosuvastatin (CRESTOR) 10 MG tablet Take 1 tablet (10 mg total) by mouth daily. 30 tablet 11  . traMADol (ULTRAM) 50 MG tablet TAKE 1 TABLE BY MOUTH EVERY 12 HOURS AS NEEDED 100 tablet 2  . traZODone (DESYREL) 150 MG tablet Take 1 tablet (150 mg total) by mouth at bedtime. 30 tablet 11  . Calcium Carbonate-Vitamin  D (CALCIUM + D PO) Take by mouth daily.     No current facility-administered medications for this visit.   Social History   Social History  . Marital Status: Widowed    Spouse Name: N/A  . Number of Children: N/A  . Years of Education: N/A   Occupational History  . Not on file.   Social History Main Topics  . Smoking status: Former Smoker -- 1.50 packs/day for 25 years    Types: Cigarettes    Quit date: 12/20/1983  . Smokeless tobacco: Never Used  . Alcohol Use: No     Comment: former heavy use/alcoholism; quit in 1980 with treatment  . Drug Use: No     Comment: former abuse of prescription drugs during 1970s.  . Sexual Activity: No   Other Topics Concern  . Not on file   Social History Narrative   Marital status:  Widowed since 2007.  Widowed twice.  Not dating but interested.      Children: none      Lives: alone in house.      Employment:  Retired; at age 59; pet sits alot      Tobacco: quit 30 years ago      Alcohol: quit in 1980; in recovery since 1980s.      Drugs: none now; history of prescription drug abuse (Valium, anything I could get) in 1970s.      Exercise: cardio,weights, and abs 5 x a week for 1 hour   Family History  Problem Relation Age of Onset  . Stroke Mother 86    multiple CVAs  . Hypertension Mother   . Hyperlipidemia Mother   . Osteoporosis Mother   . Mental illness Mother     paranoid schizophrenia  . Alzheimer's disease Father   . Diabetes Father   . Hypertension Father   . Cancer Father     prostate  . Osteoporosis Sister   . Kidney disease Sister   . Stroke Sister   . Hyperlipidemia Sister   . Hypertension Sister   . Mental illness Sister     depression; multiple behavior health admissions  . Cancer Sister     leukemia  . Osteoporosis Maternal Grandmother   . Stroke Maternal Grandmother   . Osteoporosis Paternal Grandmother   . Alzheimer's disease Paternal Grandfather   . Cancer Cousin     MATERNAL IST COUSIN - BREAST CA        Objective:    BP 132/72 mmHg  Pulse 79  Temp(Src) 97.8 F (36.6 C) (Oral)  Resp 16  Ht 5\' 1"  (1.549 m)  Wt 129 lb 3.2 oz (58.605 kg)  BMI 24.42 kg/m2 Physical Exam  Constitutional: She is oriented to person, place, and time. She appears well-developed and well-nourished. No distress.  HENT:  Head: Normocephalic and atraumatic.  Right Ear: External ear normal.  Left Ear: External ear normal.  Nose: Nose normal.  Mouth/Throat: Oropharynx is clear and moist.  Eyes: Conjunctivae and EOM are normal. Pupils are equal, round, and reactive to light.  Neck: Normal range of motion. Neck supple. Carotid bruit is not present. No thyromegaly present.  Cardiovascular: Normal rate, regular rhythm, normal heart sounds and intact distal pulses.  Exam reveals no gallop and no friction rub.   No murmur heard. Pulmonary/Chest: Effort normal and breath sounds normal. She has no wheezes. She has no rales.  Abdominal: Soft. Bowel sounds are normal. She exhibits no distension and no mass. There is no tenderness. There is no rebound and no guarding.  Lymphadenopathy:    She has no cervical adenopathy.  Neurological: She is alert and oriented to person, place, and time. No cranial nerve deficit.  Skin: Skin is warm and dry. No rash noted. She is not diaphoretic. No erythema. No pallor.  Psychiatric: She has a normal mood and affect. Her behavior is normal.   Results for orders placed or performed in visit on 08/21/15  CBC with Differential/Platelet  Result Value Ref Range   WBC 5.9 4.0 - 10.5 K/uL   RBC 4.22 3.87 - 5.11 MIL/uL   Hemoglobin 14.3 12.0 - 15.0 g/dL   HCT 82.9 56.2 - 13.0 %   MCV 99.5 78.0 - 100.0 fL   MCH 33.9 26.0 - 34.0 pg   MCHC 34.0 30.0 - 36.0 g/dL   RDW 86.5 78.4 - 69.6 %   Platelets 314 150 - 400 K/uL   MPV 8.8 8.6 - 12.4 fL   Neutrophils Relative % 54 43 - 77 %   Neutro Abs 3.2 1.7 - 7.7 K/uL   Lymphocytes Relative 32 12 - 46 %   Lymphs Abs 1.9 0.7 - 4.0 K/uL    Monocytes Relative 10 3 - 12 %   Monocytes Absolute 0.6 0.1 - 1.0 K/uL   Eosinophils Relative 3 0 - 5 %   Eosinophils Absolute 0.2 0.0 - 0.7 K/uL   Basophils Relative 1 0 - 1 %   Basophils Absolute 0.1 0.0 - 0.1 K/uL   Smear Review Criteria for review not met   Comprehensive metabolic panel  Result Value Ref Range   Sodium 144 135 - 146 mmol/L   Potassium 4.0 3.5 - 5.3 mmol/L   Chloride 102 98 - 110 mmol/L   CO2 31 20 - 31 mmol/L   Glucose, Bld 102 (H) 65 - 99 mg/dL   BUN 14 7 - 25 mg/dL   Creat 2.95 2.84 - 1.32 mg/dL   Total Bilirubin 0.5 0.2 - 1.2 mg/dL   Alkaline Phosphatase 48 33 - 130 U/L   AST 31 10 - 35 U/L   ALT 22 6 - 29 U/L   Total Protein 6.2 6.1 - 8.1 g/dL   Albumin 4.2 3.6 - 5.1 g/dL   Calcium 9.5 8.6 - 44.0 mg/dL  Lipid panel  Result Value Ref Range   Cholesterol 228 (H) 125 - 200 mg/dL   Triglycerides 55 <102 mg/dL   HDL 725 >=36 mg/dL   Total CHOL/HDL Ratio 2.0 <=5.0 Ratio   VLDL 11 <30 mg/dL   LDL Cholesterol 644 <034 mg/dL   PREVNAR 13 ADMINISTERED.    Assessment & Plan:   1. Slow transit constipation   2. Rectal mucosa prolapse   3. Fecal incontinence   4. Alcoholism in recovery   5. Need for prophylactic vaccination against Streptococcus pneumoniae (pneumococcus)   6. Hyperglycemia  1.  Constipation: stable; continue Miralax daily. 2.  Rectal prolapse with fecal incontinence: persistent; reluctant to undergo surgical repair due to recovery time; interested in trial of physical therapy and agreeable; referral placed; pt has appointment with [facility] PT. 3.  Alcoholism in recovery: stable. 4.  Hyperglycemia: present at last visit.  Will repeat at next visit with HgbA1c. 5.  S/p Prevnar 13.   Orders Placed This Encounter  Procedures  . Pneumococcal conjugate vaccine 13-valent IM  . Ambulatory referral to Physical Therapy    Referral Priority:  Routine    Referral Type:  Physical Medicine    Referral Reason:  Specialty Services  Required    Requested Specialty:  Physical Therapy    Number of Visits Requested:  1   Meds ordered this encounter  Medications  . Magnesium 300 MG CAPS    Sig: Take by mouth daily.    No Follow-up on file.    Joandry Slagter Paulita Fujita, M.D. [facility] [address] [place], [place]  [address] [phone] phone [phone] fax

## 2015-09-09 ENCOUNTER — Ambulatory Visit: Payer: Medicare Other | Admitting: Family Medicine

## 2015-09-11 ENCOUNTER — Ambulatory Visit: Payer: Medicare Other | Admitting: Family Medicine

## 2015-09-23 ENCOUNTER — Encounter: Payer: Self-pay | Admitting: Family Medicine

## 2015-10-11 ENCOUNTER — Encounter: Payer: Self-pay | Admitting: Family Medicine

## 2015-10-28 ENCOUNTER — Other Ambulatory Visit: Payer: Self-pay | Admitting: Physician Assistant

## 2015-11-09 ENCOUNTER — Other Ambulatory Visit: Payer: Self-pay | Admitting: Physician Assistant

## 2016-01-11 ENCOUNTER — Ambulatory Visit (INDEPENDENT_AMBULATORY_CARE_PROVIDER_SITE_OTHER): Payer: PPO | Admitting: Family Medicine

## 2016-01-11 ENCOUNTER — Encounter: Payer: Self-pay | Admitting: Family Medicine

## 2016-01-11 VITALS — BP 129/79 | HR 79 | Temp 98.5°F | Resp 16 | Ht 61.0 in | Wt 129.6 lb

## 2016-01-11 DIAGNOSIS — I10 Essential (primary) hypertension: Secondary | ICD-10-CM | POA: Diagnosis not present

## 2016-01-11 DIAGNOSIS — K623 Rectal prolapse: Secondary | ICD-10-CM

## 2016-01-11 DIAGNOSIS — K219 Gastro-esophageal reflux disease without esophagitis: Secondary | ICD-10-CM

## 2016-01-11 DIAGNOSIS — E7800 Pure hypercholesterolemia, unspecified: Secondary | ICD-10-CM | POA: Diagnosis not present

## 2016-01-11 DIAGNOSIS — K5901 Slow transit constipation: Secondary | ICD-10-CM

## 2016-01-11 NOTE — Progress Notes (Signed)
Subjective:    Patient ID: Cheryl Kent, female    DOB: 1946/06/11, 70 y.o.   MRN: [id number]  01/11/2016  medication review and Gastroesophageal Reflux   HPI This 70 y.o. female presents for acute visit:   1.  Hypercholesterolemia:  Always goes into donut hole at end of year due to Crestor.  Scheduled this appointment three weeks ago to discuss statin expense; with recent fill of Crestor, filled Crestor; $15 for generic; the brand name was $40.  Has been maintained on statins for years.  Moderate control with other statins; return in April 2017 for FLP.    2.  HTN:  Concerned about ACE-I and elevated potassium levels.    3.  Cold-ease with zinc; wants to make sure fine with medications.  4.  GERD/Hiatal hernia:  S/p upper GI; gave impression that moderate to large in size.  Mentioned that some people need surgery.  Barium swallow only showed HH.  Drinks coffee 2 cups every morning.  NO soft drinks; no tea.  Eats before going to bed. Must eat before going to bed.  Elevates bed which controls nighttime reflux.  Years ago, started Trazodone and quit eating at night and lost a ton of weight.  Eats healthy things at bedtime.  Taking Zantac 150mg  bid; just started 150mg  bid; has Nexium rx but does not want to go there; Omeprazole works very well.  Will sparingly take Tums; uses sugar free gum PRN; also will use vinegar.  Admits to overeating.    5.  Rectal prolapse: s/p eight visits with physical therapist at [facility].  Did internal exams for firtst two visits; then met with other physical therapist who worked on core strengthening; still has prolapse.  Very very pleased with results.  Pt willing to follow any instructions and physical therapy.  Performs exercises every hour; contracting anus for 5 seconds three times per hour.  Still has unpredictable bowels yet much improve.     Review of Systems  Constitutional: Negative for fever, chills, diaphoresis and fatigue.  Eyes: Negative for visual  disturbance.  Respiratory: Negative for cough and shortness of breath.   Cardiovascular: Negative for chest pain, palpitations and leg swelling.  Gastrointestinal: Negative for nausea, vomiting, abdominal pain, diarrhea and constipation.  Endocrine: Negative for cold intolerance, heat intolerance, polydipsia, polyphagia and polyuria.  Neurological: Negative for dizziness, tremors, seizures, syncope, facial asymmetry, speech difficulty, weakness, light-headedness, numbness and headaches.    Past Medical History  Diagnosis Date  . Substance abuse   . Cataract   . Anxiety   . Tuberculosis 2012  . GERD (gastroesophageal reflux disease)   . Positive QuantiFERON-TB Gold test 12/19/2010    Snider/ID; treated three months.  2012.  02/17/2011 Hypertension   . Rosacea conjunctivitis   . Allergy     previous allergy shots.    . Arthritis     DDD lumbar spine, hands B  . Rectal prolapse   . Fecal incontinence    Past Surgical History  Procedure Laterality Date  . Tonsillectomy and adenoidectomy      childhood  . Breast surgery      #1 age 26 yo  #2 age 70's  . Tubal ligation    . Shoulder surgery    . Hand surgery    . Foot surgery    . Eye surgery Left 10/07/2014    Cataract removal- Dr. 59's  . Eye surgery Right 11/18/2014    Cataract removal- Dr. Elmer Picker   Allergies  Allergen Reactions  . Penicillins Other (See Comments)    infant  . Pneumococcal Vaccines   . Sulfamethoxazole-Trimethoprim   . Sulfonamide Derivatives    Current Outpatient Prescriptions  Medication Sig Dispense Refill  . aspirin 81 MG tablet Take 81 mg by mouth daily.      . Calcium Carbonate-Vitamin D (CALCIUM + D PO) Take by mouth daily.    . Cholecalciferol (VITAMIN D) 2000 UNITS CAPS Take by mouth daily.    . Coenzyme Q10 (COQ10) 100 MG CAPS Take by mouth daily.    . Lactobacillus (DIGESTIVE HEALTH PROBIOTIC PO) Take by mouth. Digestive Advantage 1 gummie chewed daily.    Marland Kitchen lisinopril-hydrochlorothiazide  (PRINZIDE,ZESTORETIC) 20-25 MG per tablet Take 1 tablet by mouth daily. 90 tablet 3  . Magnesium 300 MG CAPS Take by mouth daily.    . meloxicam (MOBIC) 15 MG tablet Take 1 tablet (15 mg total) by mouth daily. 90 tablet 1  . Multiple Vitamin (MULTIVITAMIN) tablet Take 1 tablet by mouth daily.    . Omega-3 Fatty Acids (FISH OIL) 1000 MG CAPS Take by mouth daily.    . polyethylene glycol powder (GLYCOLAX/MIRALAX) powder Take 17 g by mouth daily. Use as needed for constipation 850 g 3  . ranitidine (ZANTAC) 150 MG tablet Take 75 mg by mouth at bedtime.     . rosuvastatin (CRESTOR) 10 MG tablet Take 1 tablet (10 mg total) by mouth daily. 30 tablet 11  . traMADol (ULTRAM) 50 MG tablet TAKE 1 TABLE BY MOUTH EVERY 12 HOURS AS NEEDED 100 tablet 2  . traZODone (DESYREL) 150 MG tablet Take 1 tablet (150 mg total) by mouth at bedtime. 30 tablet 11  . traZODone (DESYREL) 150 MG tablet TAKE 1 TABLET (150 MG TOTAL) BY MOUTH AT BEDTIME. 90 tablet 2   No current facility-administered medications for this visit.   Social History   Social History  . Marital Status: Widowed    Spouse Name: N/A  . Number of Children: N/A  . Years of Education: N/A   Occupational History  . Not on file.   Social History Main Topics  . Smoking status: Former Smoker -- 1.50 packs/day for 25 years    Types: Cigarettes    Quit date: 12/20/1983  . Smokeless tobacco: Never Used  . Alcohol Use: No     Comment: former heavy use/alcoholism; quit in 1980 with treatment  . Drug Use: No     Comment: former abuse of prescription drugs during 1970s.  . Sexual Activity: No   Other Topics Concern  . Not on file   Social History Narrative   Marital status:  Widowed since 2007.  Widowed twice.  Not dating but interested.      Children: none      Lives: alone in house.      Employment:  Retired; at age 76; pet sits alot      Tobacco: quit 30 years ago      Alcohol: quit in 1980; in recovery since 1980s.      Drugs: none now;  history of prescription drug abuse (Valium, anything I could get) in 1970s.      Exercise: cardio,weights, and abs 5 x a week for 1 hour   Family History  Problem Relation Age of Onset  . Stroke Mother 105    multiple CVAs  . Hypertension Mother   . Hyperlipidemia Mother   . Osteoporosis Mother   . Mental illness Mother     paranoid schizophrenia  .  Alzheimer's disease Father   . Diabetes Father   . Hypertension Father   . Cancer Father     prostate  . Osteoporosis Sister   . Kidney disease Sister   . Stroke Sister   . Hyperlipidemia Sister   . Hypertension Sister   . Mental illness Sister     depression; multiple behavior health admissions  . Cancer Sister     leukemia  . Osteoporosis Maternal Grandmother   . Stroke Maternal Grandmother   . Osteoporosis Paternal Grandmother   . Alzheimer's disease Paternal Grandfather   . Cancer Cousin     MATERNAL IST COUSIN - BREAST CA       Objective:    BP 129/79 mmHg  Pulse 79  Temp(Src) 98.5 F (36.9 C) (Oral)  Resp 16  Ht '5\' 1"'$  (1.549 m)  Wt 129 lb 9.6 oz (58.786 kg)  BMI 24.50 kg/m2  SpO2 96% Physical Exam  Constitutional: She is oriented to person, place, and time. She appears well-developed and well-nourished. No distress.  HENT:  Head: Normocephalic and atraumatic.  Right Ear: External ear normal.  Left Ear: External ear normal.  Nose: Nose normal.  Mouth/Throat: Oropharynx is clear and moist.  Eyes: Conjunctivae and EOM are normal. Pupils are equal, round, and reactive to light.  Neck: Normal range of motion. Neck supple. Carotid bruit is not present. No thyromegaly present.  Cardiovascular: Normal rate, regular rhythm, normal heart sounds and intact distal pulses.  Exam reveals no gallop and no friction rub.   No murmur heard. Pulmonary/Chest: Effort normal and breath sounds normal. She has no wheezes. She has no rales.  Abdominal: Soft. Bowel sounds are normal. She exhibits no distension and no mass. There is  no tenderness. There is no rebound and no guarding.  Lymphadenopathy:    She has no cervical adenopathy.  Neurological: She is alert and oriented to person, place, and time. No cranial nerve deficit.  Skin: Skin is warm and dry. No rash noted. She is not diaphoretic. No erythema. No pallor.  Psychiatric: She has a normal mood and affect. Her behavior is normal.   Results for orders placed or performed in visit on 08/21/15  CBC with Differential/Platelet  Result Value Ref Range   WBC 5.9 4.0 - 10.5 K/uL   RBC 4.22 3.87 - 5.11 MIL/uL   Hemoglobin 14.3 12.0 - 15.0 g/dL   HCT 42.0 36.0 - 46.0 %   MCV 99.5 78.0 - 100.0 fL   MCH 33.9 26.0 - 34.0 pg   MCHC 34.0 30.0 - 36.0 g/dL   RDW 12.3 11.5 - 15.5 %   Platelets 314 150 - 400 K/uL   MPV 8.8 8.6 - 12.4 fL   Neutrophils Relative % 54 43 - 77 %   Neutro Abs 3.2 1.7 - 7.7 K/uL   Lymphocytes Relative 32 12 - 46 %   Lymphs Abs 1.9 0.7 - 4.0 K/uL   Monocytes Relative 10 3 - 12 %   Monocytes Absolute 0.6 0.1 - 1.0 K/uL   Eosinophils Relative 3 0 - 5 %   Eosinophils Absolute 0.2 0.0 - 0.7 K/uL   Basophils Relative 1 0 - 1 %   Basophils Absolute 0.1 0.0 - 0.1 K/uL   Smear Review Criteria for review not met   Comprehensive metabolic panel  Result Value Ref Range   Sodium 144 135 - 146 mmol/L   Potassium 4.0 3.5 - 5.3 mmol/L   Chloride 102 98 - 110 mmol/L  CO2 31 20 - 31 mmol/L   Glucose, Bld 102 (H) 65 - 99 mg/dL   BUN 14 7 - 25 mg/dL   Creat 0.79 0.60 - 0.93 mg/dL   Total Bilirubin 0.5 0.2 - 1.2 mg/dL   Alkaline Phosphatase 48 33 - 130 U/L   AST 31 10 - 35 U/L   ALT 22 6 - 29 U/L   Total Protein 6.2 6.1 - 8.1 g/dL   Albumin 4.2 3.6 - 5.1 g/dL   Calcium 9.5 8.6 - 10.4 mg/dL  Lipid panel  Result Value Ref Range   Cholesterol 228 (H) 125 - 200 mg/dL   Triglycerides 55 <150 mg/dL   HDL 115 >=46 mg/dL   Total CHOL/HDL Ratio 2.0 <=5.0 Ratio   VLDL 11 <30 mg/dL   LDL Cholesterol 102 <130 mg/dL       Assessment & Plan:  No  diagnosis found.  No orders of the defined types were placed in this encounter.   No orders of the defined types were placed in this encounter.    No Follow-up on file.    Kristi Elayne Guerin, M.D. [facility] [address] [place], [place]  [address] [phone] phone [phone] fax

## 2016-01-11 NOTE — Patient Instructions (Signed)
Food Choices for Gastroesophageal Reflux Disease, Adult When you have gastroesophageal reflux disease (GERD), the foods you eat and your eating habits are very important. Choosing the right foods can help ease the discomfort of GERD. WHAT GENERAL GUIDELINES DO I NEED TO FOLLOW?  Choose fruits, vegetables, whole grains, low-fat dairy products, and low-fat meat, fish, and poultry.  Limit fats such as oils, salad dressings, butter, nuts, and avocado.  Keep a food diary to identify foods that cause symptoms.  Avoid foods that cause reflux. These may be different for different people.  Eat frequent small meals instead of three large meals each day.  Eat your meals slowly, in a relaxed setting.  Limit fried foods.  Cook foods using methods other than frying.  Avoid drinking alcohol.  Avoid drinking large amounts of liquids with your meals.  Avoid bending over or lying down until 2-3 hours after eating. WHAT FOODS ARE NOT RECOMMENDED? The following are some foods and drinks that may worsen your symptoms: Vegetables Tomatoes. Tomato juice. Tomato and spaghetti sauce. Chili peppers. Onion and garlic. Horseradish. Fruits Oranges, grapefruit, and lemon (fruit and juice). Meats High-fat meats, fish, and poultry. This includes hot dogs, ribs, ham, sausage, salami, and bacon. Dairy Whole milk and chocolate milk. Sour cream. Cream. Butter. Ice cream. Cream cheese.  Beverages Coffee and tea, with or without caffeine. Carbonated beverages or energy drinks. Condiments Hot sauce. Barbecue sauce.  Sweets/Desserts Chocolate and cocoa. Donuts. Peppermint and spearmint. Fats and Oils High-fat foods, including French fries and potato chips. Other Vinegar. Strong spices, such as black pepper, white pepper, red pepper, cayenne, curry powder, cloves, ginger, and chili powder. The items listed above may not be a complete list of foods and beverages to avoid. Contact your dietitian for more  information.   This information is not intended to replace advice given to you by your health care provider. Make sure you discuss any questions you have with your health care provider.   Document Released: 12/05/2005 Document Revised: 12/26/2014 Document Reviewed: 10/09/2013 Elsevier Interactive Patient Education 2016 Elsevier Inc.  

## 2016-03-04 ENCOUNTER — Encounter: Payer: Medicare Other | Admitting: Family Medicine

## 2016-03-22 ENCOUNTER — Encounter: Payer: Self-pay | Admitting: Family Medicine

## 2016-03-22 ENCOUNTER — Ambulatory Visit (INDEPENDENT_AMBULATORY_CARE_PROVIDER_SITE_OTHER): Payer: PPO | Admitting: Family Medicine

## 2016-03-22 VITALS — BP 121/73 | HR 76 | Temp 98.8°F | Resp 16 | Ht 61.0 in | Wt 131.2 lb

## 2016-03-22 DIAGNOSIS — I10 Essential (primary) hypertension: Secondary | ICD-10-CM | POA: Diagnosis not present

## 2016-03-22 DIAGNOSIS — F1921 Other psychoactive substance dependence, in remission: Secondary | ICD-10-CM | POA: Diagnosis not present

## 2016-03-22 DIAGNOSIS — E7800 Pure hypercholesterolemia, unspecified: Secondary | ICD-10-CM | POA: Diagnosis not present

## 2016-03-22 DIAGNOSIS — IMO0002 Reserved for concepts with insufficient information to code with codable children: Secondary | ICD-10-CM

## 2016-03-22 DIAGNOSIS — K5901 Slow transit constipation: Secondary | ICD-10-CM | POA: Diagnosis not present

## 2016-03-22 DIAGNOSIS — G4700 Insomnia, unspecified: Secondary | ICD-10-CM

## 2016-03-22 DIAGNOSIS — Z0000 Encounter for general adult medical examination without abnormal findings: Secondary | ICD-10-CM

## 2016-03-22 DIAGNOSIS — F1020 Alcohol dependence, uncomplicated: Secondary | ICD-10-CM | POA: Diagnosis not present

## 2016-03-22 DIAGNOSIS — R159 Full incontinence of feces: Secondary | ICD-10-CM | POA: Diagnosis not present

## 2016-03-22 DIAGNOSIS — K623 Rectal prolapse: Secondary | ICD-10-CM | POA: Diagnosis not present

## 2016-03-22 DIAGNOSIS — F1021 Alcohol dependence, in remission: Secondary | ICD-10-CM

## 2016-03-22 DIAGNOSIS — F411 Generalized anxiety disorder: Secondary | ICD-10-CM

## 2016-03-22 DIAGNOSIS — K219 Gastro-esophageal reflux disease without esophagitis: Secondary | ICD-10-CM | POA: Diagnosis not present

## 2016-03-22 LAB — LIPID PANEL
Cholesterol: 192 mg/dL (ref 125–200)
HDL: 106 mg/dL (ref >=46)
LDL CALC: 75 mg/dL (ref <130)
Total CHOL/HDL Ratio: 1.8 Ratio (ref <=5.0)
Triglycerides: 57 mg/dL (ref <150)
VLDL: 11 mg/dL (ref <30)

## 2016-03-22 LAB — POCT URINALYSIS DIP (MANUAL ENTRY)
Bilirubin, UA: NEGATIVE
GLUCOSE UA: NEGATIVE
Ketones, POC UA: NEGATIVE
Leukocytes, UA: NEGATIVE
Nitrite, UA: NEGATIVE
PH UA: 7
Protein Ur, POC: NEGATIVE
RBC UA: NEGATIVE
SPEC GRAV UA: 1.01
UROBILINOGEN UA: 0.2

## 2016-03-22 LAB — COMPREHENSIVE METABOLIC PANEL
ALK PHOS: 46 U/L (ref 33–130)
ALT: 20 U/L (ref 6–29)
AST: 24 U/L (ref 10–35)
Albumin: 4.1 g/dL (ref 3.6–5.1)
BILIRUBIN TOTAL: 0.7 mg/dL (ref 0.2–1.2)
BUN: 14 mg/dL (ref 7–25)
CALCIUM: 9.4 mg/dL (ref 8.6–10.4)
CO2: 29 mmol/L (ref 20–31)
Chloride: 101 mmol/L (ref 98–110)
Creat: 0.78 mg/dL (ref 0.60–0.93)
GLUCOSE: 86 mg/dL (ref 65–99)
Potassium: 3.8 mmol/L (ref 3.5–5.3)
SODIUM: 141 mmol/L (ref 135–146)
Total Protein: 6.2 g/dL (ref 6.1–8.1)

## 2016-03-22 LAB — CBC WITH DIFFERENTIAL/PLATELET
BASOS PCT: 1 %
Basophils Absolute: 58 cells/uL (ref 0–200)
EOS PCT: 3 %
Eosinophils Absolute: 174 cells/uL (ref 15–500)
HEMATOCRIT: 41.8 % (ref 35.0–45.0)
Hemoglobin: 14.2 g/dL (ref 11.7–15.5)
LYMPHS PCT: 26 %
Lymphs Abs: 1508 cells/uL (ref 850–3900)
MCH: 33.1 pg — ABNORMAL HIGH (ref 27.0–33.0)
MCHC: 34 g/dL (ref 32.0–36.0)
MCV: 97.4 fL (ref 80.0–100.0)
MONO ABS: 580 {cells}/uL (ref 200–950)
MONOS PCT: 10 %
MPV: 8.7 fL (ref 7.5–12.5)
NEUTROS PCT: 60 %
Neutro Abs: 3480 cells/uL (ref 1500–7800)
PLATELETS: 281 10*3/uL (ref 140–400)
RBC: 4.29 MIL/uL (ref 3.80–5.10)
RDW: 12.5 % (ref 11.0–15.0)
WBC: 5.8 10*3/uL (ref 3.8–10.8)

## 2016-03-22 MED ORDER — ROSUVASTATIN CALCIUM 10 MG PO TABS: 10.0000 mg | ORAL_TABLET | Freq: Every day | ORAL | Status: DC

## 2016-03-22 MED ORDER — TRAZODONE HCL 150 MG PO TABS: 150.0000 mg | ORAL_TABLET | Freq: Every day | ORAL | Status: DC

## 2016-03-22 MED ORDER — LISINOPRIL-HYDROCHLOROTHIAZIDE 20-25 MG PO TABS: 1.0000 | ORAL_TABLET | Freq: Every day | ORAL | Status: DC

## 2016-03-22 NOTE — Progress Notes (Signed)
Subjective:    Patient ID: Cheryl Kent, female    DOB: 06-26-1946, 70 y.o.   MRN: [id number]  03/22/2016  Annual Exam   HPI This 70 y.o. female presents for Annual Wellness Examination.   Last physical:  03-12-2015 Pap smear:  2011 Mammogram:  03-31-2015; appointment 03-31-2016 Colonoscopy:  2013; repeat in 10 years; poor prep twice Bone density: 03-31-2015 TDAP:  2012 Pneumovax:  2008; 08-2015 Zostavax:  2008 Influenza:  09-24-2015 Eye exam:  2017; +glasses; no glaucoma; rosacea in eyes. Dental exam:  Dentures upper and lower partial.  HTN: Patient reports good compliance with medication, good tolerance to medication, and good symptom control.    Hyperlipidemia: Patient reports good compliance with medication, good tolerance to medication, and good symptom control.    GERD: Patient reports good compliance with medication, good tolerance to medication, and good symptom control.    Insomnia: Patient reports good compliance with medication, good tolerance to medication, and good symptom control.   Takes Unisom qhs.  Sleeping well on regimen.    Rosacea ophthlamology: followed  Review of Systems  Constitutional: Negative for fever, chills, diaphoresis, activity change, appetite change, fatigue and unexpected weight change.  HENT: Negative for congestion, dental problem, drooling, ear discharge, ear pain, facial swelling, hearing loss, mouth sores, nosebleeds, postnasal drip, rhinorrhea, sinus pressure, sneezing, sore throat, tinnitus, trouble swallowing and voice change.   Eyes: Negative for photophobia, pain, discharge, redness, itching and visual disturbance.  Respiratory: Negative for apnea, cough, choking, chest tightness, shortness of breath, wheezing and stridor.   Cardiovascular: Negative for chest pain, palpitations and leg swelling.  Gastrointestinal: Negative for nausea, vomiting, abdominal pain, diarrhea, constipation, blood in stool, abdominal distention, anal bleeding  and rectal pain.  Endocrine: Negative for cold intolerance, heat intolerance, polydipsia, polyphagia and polyuria.  Genitourinary: Negative for dysuria, urgency, frequency, hematuria, flank pain, decreased urine volume, vaginal bleeding, vaginal discharge, enuresis, difficulty urinating, genital sores, vaginal pain, menstrual problem, pelvic pain and dyspareunia.       Nocturia x 4; drinks a lot of water.  Drinks 64 ounces of water per day.  Drinks until bedtime.  No urinary incontinence.  Musculoskeletal: Negative for myalgias, back pain, joint swelling, arthralgias, gait problem, neck pain and neck stiffness.  Skin: Negative for color change, pallor, rash and wound.  Allergic/Immunologic: Negative for environmental allergies, food allergies and immunocompromised state.  Neurological: Negative for dizziness, tremors, seizures, syncope, facial asymmetry, speech difficulty, weakness, light-headedness, numbness and headaches.  Hematological: Negative for adenopathy. Does not bruise/bleed easily.  Psychiatric/Behavioral: Negative for suicidal ideas, hallucinations, behavioral problems, confusion, sleep disturbance, self-injury, dysphoric mood, decreased concentration and agitation. The patient is not nervous/anxious and is not hyperactive.     Past Medical History  Diagnosis Date  . Substance abuse   . Cataract   . Anxiety   . Tuberculosis 2012  . GERD (gastroesophageal reflux disease)   . Positive QuantiFERON-TB Gold test 12/19/2010    Snider/ID; treated three months.  2012.  Marland Kitchen Hypertension   . Rosacea conjunctivitis   . Allergy     previous allergy shots.    . Arthritis     DDD lumbar spine, hands B  . Rectal prolapse   . Fecal incontinence    Past Surgical History  Procedure Laterality Date  . Tonsillectomy and adenoidectomy      childhood  . Breast surgery      #1 age 72 yo  #2 age 17's  . Tubal ligation    . Shoulder  surgery    . Hand surgery    . Foot surgery    . Eye surgery  Left 10/07/2014    Cataract removal- Dr. Herbert Deaner  . Eye surgery Right 11/18/2014    Cataract removal- Dr. Herbert Deaner   Allergies  Allergen Reactions  . Penicillins Other (See Comments)    infant  . Pneumococcal Vaccines   . Sulfamethoxazole-Trimethoprim   . Sulfonamide Derivatives    Current Outpatient Prescriptions  Medication Sig Dispense Refill  . aspirin 81 MG tablet Take 81 mg by mouth daily.      . Calcium Carbonate-Vitamin D (CALCIUM + D PO) Take by mouth daily.    . Cholecalciferol (VITAMIN D) 2000 UNITS CAPS Take by mouth daily.    . Coenzyme Q10 (COQ10) 100 MG CAPS Take by mouth daily.    . Lactobacillus (DIGESTIVE HEALTH PROBIOTIC PO) Take by mouth. Digestive Advantage 1 gummie chewed daily.    Marland Kitchen lisinopril-hydrochlorothiazide (PRINZIDE,ZESTORETIC) 20-25 MG per tablet Take 1 tablet by mouth daily. 90 tablet 3  . Magnesium 300 MG CAPS Take by mouth daily.    . meloxicam (MOBIC) 15 MG tablet Take 1 tablet (15 mg total) by mouth daily. 90 tablet 1  . Multiple Vitamin (MULTIVITAMIN) tablet Take 1 tablet by mouth daily.    . Omega-3 Fatty Acids (FISH OIL) 1000 MG CAPS Take by mouth daily.    . polyethylene glycol powder (GLYCOLAX/MIRALAX) powder Take 17 g by mouth daily. Use as needed for constipation 850 g 3  . ranitidine (ZANTAC) 150 MG tablet Take 75 mg by mouth at bedtime. Reported on 03/22/2016    . rosuvastatin (CRESTOR) 10 MG tablet Take 1 tablet (10 mg total) by mouth daily. 30 tablet 11  . traMADol (ULTRAM) 50 MG tablet TAKE 1 TABLE BY MOUTH EVERY 12 HOURS AS NEEDED 100 tablet 2  . traZODone (DESYREL) 150 MG tablet Take 1 tablet (150 mg total) by mouth at bedtime. 30 tablet 11  . traZODone (DESYREL) 150 MG tablet TAKE 1 TABLET (150 MG TOTAL) BY MOUTH AT BEDTIME. 90 tablet 2   No current facility-administered medications for this visit.   Social History   Social History  . Marital Status: Widowed    Spouse Name: N/A  . Number of Children: N/A  . Years of Education: N/A     Occupational History  . Not on file.   Social History Main Topics  . Smoking status: Former Smoker -- 1.50 packs/day for 25 years    Types: Cigarettes    Quit date: 12/20/1983  . Smokeless tobacco: Never Used  . Alcohol Use: No     Comment: former heavy use/alcoholism; quit in 1980 with treatment  . Drug Use: No     Comment: former abuse of prescription drugs during 1970s.  . Sexual Activity: No   Other Topics Concern  . Not on file   Social History Narrative   Marital status:  Widowed since 2007.  Widowed twice.  Not dating but interested.      Children: none      Lives: alone in house.      Employment:  Retired; at age 19; pet sits alot      Tobacco: quit 30 years ago      Alcohol: quit in 1980; in recovery since 1980s.      Drugs: none now; history of prescription drug abuse (Valium, anything I could get) in 1970s.      Exercise: cardio,weights, and abs 5 x a week for  1 hour   Family History  Problem Relation Age of Onset  . Stroke Mother 69    multiple CVAs  . Hypertension Mother   . Hyperlipidemia Mother   . Osteoporosis Mother   . Mental illness Mother     paranoid schizophrenia  . Alzheimer's disease Father   . Diabetes Father   . Hypertension Father   . Cancer Father     prostate  . Osteoporosis Sister   . Kidney disease Sister   . Stroke Sister   . Hyperlipidemia Sister   . Hypertension Sister   . Mental illness Sister     depression; multiple behavior health admissions  . Cancer Sister     leukemia  . Osteoporosis Maternal Grandmother   . Stroke Maternal Grandmother   . Osteoporosis Paternal Grandmother   . Alzheimer's disease Paternal Grandfather   . Cancer Cousin     MATERNAL IST COUSIN - BREAST CA       Objective:    BP 121/73 mmHg  Pulse 76  Temp(Src) 98.8 F (37.1 C) (Oral)  Resp 16  Ht 5\' 1"  (1.549 m)  Wt 131 lb 3.2 oz (59.512 kg)  BMI 24.80 kg/m2  SpO2 96% Physical Exam  Constitutional: She is oriented to person, place, and  time. She appears well-developed and well-nourished. No distress.  HENT:  Head: Normocephalic and atraumatic.  Right Ear: External ear normal.  Left Ear: External ear normal.  Nose: Nose normal.  Mouth/Throat: Oropharynx is clear and moist.  Eyes: Conjunctivae and EOM are normal. Pupils are equal, round, and reactive to light.  Neck: Normal range of motion and full passive range of motion without pain. Neck supple. No JVD present. Carotid bruit is not present. No thyromegaly present.  Cardiovascular: Normal rate, regular rhythm and normal heart sounds.  Exam reveals no gallop and no friction rub.   No murmur heard. Pulmonary/Chest: Effort normal and breath sounds normal. She has no wheezes. She has no rales.  Abdominal: Soft. Bowel sounds are normal. She exhibits no distension and no mass. There is no tenderness. There is no rebound and no guarding.  Musculoskeletal:       Right shoulder: Normal.       Left shoulder: Normal.       Cervical back: Normal.  Lymphadenopathy:    She has no cervical adenopathy.  Neurological: She is alert and oriented to person, place, and time. She has normal reflexes. No cranial nerve deficit. She exhibits normal muscle tone. Coordination normal.  Skin: Skin is warm and dry. No rash noted. She is not diaphoretic. No erythema. No pallor.  Psychiatric: She has a normal mood and affect. Her behavior is normal. Judgment and thought content normal.  Nursing note and vitals reviewed.  Results for orders placed or performed in visit on 03/22/16  POCT urinalysis dipstick  Result Value Ref Range   Color, UA yellow yellow   Clarity, UA clear clear   Glucose, UA negative negative   Bilirubin, UA negative negative   Ketones, POC UA negative negative   Spec Grav, UA 1.010    Blood, UA negative negative   pH, UA 7.0    Protein Ur, POC negative negative   Urobilinogen, UA 0.2    Nitrite, UA Negative Negative   Leukocytes, UA Negative Negative       Assessment  & Plan:   1. Medicare annual wellness visit, subsequent   2. Hypercholesteremia   3. Essential hypertension   4. Gastroesophageal reflux disease  without esophagitis   5. GAD (generalized anxiety disorder)   6. Alcoholism in recovery ([place])   7. History of prescription drug abuse ([place])   8. Insomnia   9. Rectal prolapse   10. Slow transit constipation   11. Fecal incontinence     Orders Placed This Encounter  Procedures  . CBC with Differential/Platelet  . Comprehensive metabolic panel    Order Specific Question:  Has the patient fasted?    Answer:  Yes  . Lipid panel    Order Specific Question:  Has the patient fasted?    Answer:  Yes  . POCT urinalysis dipstick   No orders of the defined types were placed in this encounter.    No Follow-up on file.    Kristi Elayne Guerin, M.D. [facility] [address] [place], [place]  [address] [phone] phone [phone] fax

## 2016-03-22 NOTE — Patient Instructions (Signed)

## 2016-03-31 ENCOUNTER — Encounter: Payer: Self-pay | Admitting: Family Medicine

## 2016-03-31 DIAGNOSIS — Z1231 Encounter for screening mammogram for malignant neoplasm of breast: Secondary | ICD-10-CM | POA: Diagnosis not present

## 2016-04-05 DIAGNOSIS — L821 Other seborrheic keratosis: Secondary | ICD-10-CM | POA: Diagnosis not present

## 2016-04-05 DIAGNOSIS — D239 Other benign neoplasm of skin, unspecified: Secondary | ICD-10-CM | POA: Diagnosis not present

## 2016-04-05 DIAGNOSIS — L570 Actinic keratosis: Secondary | ICD-10-CM | POA: Diagnosis not present

## 2016-05-11 ENCOUNTER — Other Ambulatory Visit: Payer: Self-pay | Admitting: Physician Assistant

## 2016-05-11 DIAGNOSIS — D485 Neoplasm of uncertain behavior of skin: Secondary | ICD-10-CM | POA: Diagnosis not present

## 2016-05-11 DIAGNOSIS — D224 Melanocytic nevi of scalp and neck: Secondary | ICD-10-CM | POA: Diagnosis not present

## 2016-07-05 ENCOUNTER — Other Ambulatory Visit: Payer: Self-pay | Admitting: Physician Assistant

## 2016-07-05 DIAGNOSIS — C44320 Squamous cell carcinoma of skin of unspecified parts of face: Secondary | ICD-10-CM

## 2016-07-05 DIAGNOSIS — D0439 Carcinoma in situ of skin of other parts of face: Secondary | ICD-10-CM | POA: Diagnosis not present

## 2016-07-05 DIAGNOSIS — D485 Neoplasm of uncertain behavior of skin: Secondary | ICD-10-CM | POA: Diagnosis not present

## 2016-07-05 DIAGNOSIS — L820 Inflamed seborrheic keratosis: Secondary | ICD-10-CM | POA: Diagnosis not present

## 2016-07-05 DIAGNOSIS — L570 Actinic keratosis: Secondary | ICD-10-CM | POA: Diagnosis not present

## 2016-07-05 HISTORY — PX: SKIN BIOPSY: SHX1

## 2016-07-05 HISTORY — DX: Squamous cell carcinoma of skin of unspecified parts of face: C44.320

## 2016-08-11 DIAGNOSIS — D0439 Carcinoma in situ of skin of other parts of face: Secondary | ICD-10-CM | POA: Diagnosis not present

## 2016-08-15 DIAGNOSIS — Z961 Presence of intraocular lens: Secondary | ICD-10-CM | POA: Diagnosis not present

## 2016-08-15 DIAGNOSIS — H04123 Dry eye syndrome of bilateral lacrimal glands: Secondary | ICD-10-CM | POA: Diagnosis not present

## 2016-08-15 DIAGNOSIS — H43813 Vitreous degeneration, bilateral: Secondary | ICD-10-CM | POA: Diagnosis not present

## 2016-08-15 DIAGNOSIS — H40013 Open angle with borderline findings, low risk, bilateral: Secondary | ICD-10-CM | POA: Diagnosis not present

## 2016-08-29 ENCOUNTER — Telehealth: Payer: Self-pay | Admitting: Family Medicine

## 2016-08-29 NOTE — Telephone Encounter (Signed)
lmom to call back and reschedule appointment that she had on 09-27-16 Dr Tamala Julian will be out of office

## 2016-09-21 ENCOUNTER — Ambulatory Visit (INDEPENDENT_AMBULATORY_CARE_PROVIDER_SITE_OTHER): Payer: PPO | Admitting: Family Medicine

## 2016-09-21 ENCOUNTER — Encounter: Payer: Self-pay | Admitting: Family Medicine

## 2016-09-21 VITALS — BP 120/76 | HR 71 | Temp 98.5°F | Resp 16 | Ht 61.0 in | Wt 131.2 lb

## 2016-09-21 DIAGNOSIS — Z23 Encounter for immunization: Secondary | ICD-10-CM

## 2016-09-21 DIAGNOSIS — I10 Essential (primary) hypertension: Secondary | ICD-10-CM | POA: Diagnosis not present

## 2016-09-21 DIAGNOSIS — K5901 Slow transit constipation: Secondary | ICD-10-CM

## 2016-09-21 DIAGNOSIS — F1020 Alcohol dependence, uncomplicated: Secondary | ICD-10-CM | POA: Diagnosis not present

## 2016-09-21 DIAGNOSIS — F411 Generalized anxiety disorder: Secondary | ICD-10-CM

## 2016-09-21 DIAGNOSIS — E7800 Pure hypercholesterolemia, unspecified: Secondary | ICD-10-CM

## 2016-09-21 DIAGNOSIS — F1921 Other psychoactive substance dependence, in remission: Secondary | ICD-10-CM

## 2016-09-21 DIAGNOSIS — M545 Low back pain, unspecified: Secondary | ICD-10-CM

## 2016-09-21 DIAGNOSIS — K219 Gastro-esophageal reflux disease without esophagitis: Secondary | ICD-10-CM

## 2016-09-21 DIAGNOSIS — IMO0002 Reserved for concepts with insufficient information to code with codable children: Secondary | ICD-10-CM

## 2016-09-21 DIAGNOSIS — F1021 Alcohol dependence, in remission: Secondary | ICD-10-CM

## 2016-09-21 LAB — COMPREHENSIVE METABOLIC PANEL
ALBUMIN: 4.1 g/dL (ref 3.6–5.1)
ALT: 16 U/L (ref 6–29)
AST: 24 U/L (ref 10–35)
Alkaline Phosphatase: 38 U/L (ref 33–130)
BILIRUBIN TOTAL: 0.9 mg/dL (ref 0.2–1.2)
BUN: 10 mg/dL (ref 7–25)
CALCIUM: 9.6 mg/dL (ref 8.6–10.4)
CHLORIDE: 104 mmol/L (ref 98–110)
CO2: 26 mmol/L (ref 20–31)
Creat: 0.9 mg/dL (ref 0.60–0.93)
Glucose, Bld: 87 mg/dL (ref 65–99)
Potassium: 3.7 mmol/L (ref 3.5–5.3)
Sodium: 141 mmol/L (ref 135–146)
TOTAL PROTEIN: 6.1 g/dL (ref 6.1–8.1)

## 2016-09-21 LAB — CBC WITH DIFFERENTIAL/PLATELET
BASOS PCT: 1 %
Basophils Absolute: 56 cells/uL (ref 0–200)
EOS ABS: 112 {cells}/uL (ref 15–500)
Eosinophils Relative: 2 %
HEMATOCRIT: 40.8 % (ref 35.0–45.0)
Hemoglobin: 14.2 g/dL (ref 11.7–15.5)
Lymphocytes Relative: 28 %
Lymphs Abs: 1568 cells/uL (ref 850–3900)
MCH: 34.5 pg — ABNORMAL HIGH (ref 27.0–33.0)
MCHC: 34.8 g/dL (ref 32.0–36.0)
MCV: 99 fL (ref 80.0–100.0)
MONO ABS: 504 {cells}/uL (ref 200–950)
MPV: 8.4 fL (ref 7.5–12.5)
Monocytes Relative: 9 %
NEUTROS ABS: 3360 {cells}/uL (ref 1500–7800)
Neutrophils Relative %: 60 %
Platelets: 278 10*3/uL (ref 140–400)
RBC: 4.12 MIL/uL (ref 3.80–5.10)
RDW: 12.5 % (ref 11.0–15.0)
WBC: 5.6 10*3/uL (ref 3.8–10.8)

## 2016-09-21 LAB — POCT URINALYSIS DIP (MANUAL ENTRY)
Bilirubin, UA: NEGATIVE
Blood, UA: NEGATIVE
Glucose, UA: NEGATIVE
Ketones, POC UA: NEGATIVE
LEUKOCYTES UA: NEGATIVE
NITRITE UA: NEGATIVE
PH UA: 7
PROTEIN UA: NEGATIVE
Spec Grav, UA: 1.01
Urobilinogen, UA: 0.2

## 2016-09-21 LAB — LIPID PANEL
CHOLESTEROL: 201 mg/dL — AB (ref 125–200)
HDL: 102 mg/dL (ref >=46)
LDL CALC: 86 mg/dL (ref <130)
TRIGLYCERIDES: 64 mg/dL (ref <150)
Total CHOL/HDL Ratio: 2 Ratio (ref <=5.0)
VLDL: 13 mg/dL (ref <30)

## 2016-09-21 NOTE — Progress Notes (Signed)
Subjective:    Patient ID: Cheryl Kent, female    DOB: 03-Feb-1946, 70 y.o.   MRN: [id number]  09/21/2016  Follow-up (Hyperlipidemia )   HPI This 70 y.o. female presents for six month follow-up of hypertension, hyperlipidemia, and anxiety/depression.  Good six months. Switched medication to morning.  Nto checking BP at home.   BP Readings from Last 3 Encounters:  09/21/16 120/76  03/22/16 121/73  01/11/16 129/79    Wt Readings from Last 3 Encounters:  09/21/16 131 lb 3.2 oz (59.5 kg)  03/22/16 131 lb 3.2 oz (59.5 kg)  01/11/16 129 lb 9.6 oz (58.8 kg)     R middle back pain; exercises a lot.  Sore on R side.  Worried about an Nurse, mental health.  Curious where liver located.  No dysuria, frequency, hematuria, nocturia at baseline.  R back pain worsens with turning over at night.  Massage chair digs into back and causes pain.    L knee pain: exercises a lot; elliptical and treadmill five times per week. No swelling. Felt out of joint for 1-2 days.  Unable to put a lot of weight lon it.  Occurred one week ago.  Had been doing some yoga exercises; windshield.  No limping at this time.  Talking to personal trainer; avoid treadmill, elliptical; bike might be OK. No nighttime awakening.  With crossing legs over, it will get stuck; stiffens up.  No previous issues with L knee in the past. Took Meloxicam two last week.  Takes Meloxicam PRN.    Rectal prolapse: takes Miralax sparingly; has rectal pain intermittently; takes Tramadol sparingly; takes 2-3 Tramadol per month.  Has a current rx.   Insomnia: taking Trazodone qhs; taking Unisom qhs.  Sleep is really hard.   Review of Systems  Constitutional: Negative for chills, diaphoresis, fatigue and fever.  Eyes: Negative for visual disturbance.  Respiratory: Negative for cough and shortness of breath.   Cardiovascular: Negative for chest pain, palpitations and leg swelling.  Gastrointestinal: Negative for abdominal pain, constipation, diarrhea, nausea  and vomiting.  Endocrine: Negative for cold intolerance, heat intolerance, polydipsia, polyphagia and polyuria.  Musculoskeletal: Positive for arthralgias. Negative for gait problem and joint swelling.  Neurological: Negative for dizziness, tremors, seizures, syncope, facial asymmetry, speech difficulty, weakness, light-headedness, numbness and headaches.  Psychiatric/Behavioral: Positive for dysphoric mood and sleep disturbance. Negative for self-injury and suicidal ideas. The patient is nervous/anxious.     Past Medical History:  Diagnosis Date  . Allergy    previous allergy shots.    . Anxiety   . Arthritis    DDD lumbar spine, hands B  . Cataract   . Fecal incontinence   . GERD (gastroesophageal reflux disease)   . Hypertension   . 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  2012.  Marland Kitchen Rectal prolapse   . Rosacea conjunctivitis   . Substance abuse   . Tuberculosis 2012   Past Surgical History:  Procedure Laterality Date  . BREAST SURGERY     #1 age 4 yo  #2 age 32's  . EYE SURGERY Left 10/07/2014   Cataract removal- Dr. Elmer Picker  . EYE SURGERY Right 11/18/2014   Cataract removal- Dr. Elmer Picker  . FOOT SURGERY    . HAND SURGERY    . SHOULDER SURGERY    . TONSILLECTOMY AND ADENOIDECTOMY     childhood  . TUBAL LIGATION     Allergies  Allergen Reactions  . Penicillins Other (See Comments)  infant  . Pneumococcal Vaccines   . Sulfamethoxazole-Trimethoprim   . Sulfonamide Derivatives    Current Outpatient Prescriptions  Medication Sig Dispense Refill  . aspirin 81 MG tablet Take 81 mg by mouth daily.      . Calcium Carbonate-Vitamin D (CALCIUM + D PO) Take by mouth daily.    . Cholecalciferol (VITAMIN D) 2000 UNITS CAPS Take by mouth daily.    . Coenzyme Q10 (COQ10) 100 MG CAPS Take by mouth daily.    Marland Kitchen doxylamine, Sleep, (UNISOM) 25 MG tablet Take 25 mg by mouth at bedtime as needed.    . Lactobacillus (DIGESTIVE HEALTH PROBIOTIC  PO) Take by mouth. Digestive Advantage 1 gummie chewed daily.    Marland Kitchen lisinopril-hydrochlorothiazide (PRINZIDE,ZESTORETIC) 20-25 MG tablet Take 1 tablet by mouth daily. 90 tablet 3  . Magnesium 300 MG CAPS Take by mouth daily.    . meloxicam (MOBIC) 15 MG tablet Take 1 tablet (15 mg total) by mouth daily. 90 tablet 1  . Multiple Vitamin (MULTIVITAMIN) tablet Take 1 tablet by mouth daily.    . Omega-3 Fatty Acids (FISH OIL) 1000 MG CAPS Take by mouth daily.    . polyethylene glycol powder (GLYCOLAX/MIRALAX) powder Take 17 g by mouth daily. Use as needed for constipation 850 g 3  . ranitidine (ZANTAC) 150 MG tablet Take 75 mg by mouth at bedtime. Reported on 03/22/2016    . rosuvastatin (CRESTOR) 10 MG tablet Take 1 tablet (10 mg total) by mouth daily. 90 tablet 3  . traMADol (ULTRAM) 50 MG tablet TAKE 1 TABLE BY MOUTH EVERY 12 HOURS AS NEEDED 100 tablet 2  . traZODone (DESYREL) 150 MG tablet Take 1 tablet (150 mg total) by mouth at bedtime. 30 tablet 11  . traZODone (DESYREL) 150 MG tablet Take 1 tablet (150 mg total) by mouth at bedtime. 90 tablet 3   No current facility-administered medications for this visit.    Social History   Social History  . Marital status: Widowed    Spouse name: N/A  . Number of children: 0  . Years of education: N/A   Occupational History  . retired    Social History Main Topics  . Smoking status: Former Smoker    Packs/day: 1.50    Years: 25.00    Types: Cigarettes    Quit date: 12/20/1983  . Smokeless tobacco: Never Used  . Alcohol use No     Comment: former heavy use/alcoholism; quit in 1980 with treatment  . Drug use: No     Comment: former abuse of prescription drugs during 1970s.  . Sexual activity: No   Other Topics Concern  . Not on file   Social History Narrative   Marital status:  Widowed since 2007.  Widowed twice.  Not dating but interested.      Children: none      Lives: alone in house.      Employment:  Retired; at age 64; pet sits alot       Tobacco: quit 30 years ago      Alcohol: quit in 1980; in recovery since 1980s.      Drugs: none now; history of prescription drug abuse (Valium, anything I could get) in 1970s.      Exercise: cardio,weights, and abs 5 x a week for 1 hour      ADLs: independent with ADLs; drives; no assistant devices.      Advanced Directives; yes; stepdaughter is HCPOA; DNI.   Family History  Problem Relation Age  of Onset  . Stroke Mother 10    multiple CVAs  . Hypertension Mother   . Hyperlipidemia Mother   . Osteoporosis Mother   . Mental illness Mother     paranoid schizophrenia  . Alzheimer's disease Father   . Diabetes Father   . Hypertension Father   . Cancer Father     prostate  . Osteoporosis Sister   . Kidney disease Sister   . Stroke Sister   . Hyperlipidemia Sister   . Hypertension Sister   . Mental illness Sister     depression; multiple behavior health admissions  . Cancer Sister     leukemia  . Osteoporosis Maternal Grandmother   . Stroke Maternal Grandmother   . Osteoporosis Paternal Grandmother   . Alzheimer's disease Paternal Grandfather   . Cancer Cousin     MATERNAL IST COUSIN - BREAST CA       Objective:    BP 120/76 (BP Location: Left Arm, Patient Position: Sitting, Cuff Size: Small)   Pulse 71   Temp 98.5 F (36.9 C) (Oral)   Resp 16   Ht 5\' 1"  (1.549 m)   Wt 131 lb 3.2 oz (59.5 kg)   SpO2 95%   BMI 24.79 kg/m  Physical Exam  Constitutional: She is oriented to person, place, and time. She appears well-developed and well-nourished. No distress.  HENT:  Head: Normocephalic and atraumatic.  Right Ear: External ear normal.  Left Ear: External ear normal.  Nose: Nose normal.  Mouth/Throat: Oropharynx is clear and moist.  Eyes: Conjunctivae and EOM are normal. Pupils are equal, round, and reactive to light.  Neck: Normal range of motion. Neck supple. Carotid bruit is not present. No thyromegaly present.  Cardiovascular: Normal rate, regular rhythm,  normal heart sounds and intact distal pulses.  Exam reveals no gallop and no friction rub.   No murmur heard. Pulmonary/Chest: Effort normal and breath sounds normal. She has no wheezes. She has no rales.  Abdominal: Soft. Bowel sounds are normal. She exhibits no distension and no mass. There is no tenderness. There is no rebound and no guarding.  Musculoskeletal:       Left knee: She exhibits normal range of motion, no swelling, no effusion, no ecchymosis and no bony tenderness. No tenderness found. No lateral joint line tenderness noted.       Lumbar back: Normal. She exhibits normal range of motion, no tenderness, no bony tenderness, no pain and no spasm.  L KNEE: full extension and flexion; pain with extension along lateral aspect of L knee; no effusion; McMcurray's negative; Lachman's negative; anterior drawer negative.  Normal gait.  Lymphadenopathy:    She has no cervical adenopathy.  Neurological: She is alert and oriented to person, place, and time. No cranial nerve deficit.  Skin: Skin is warm and dry. No rash noted. She is not diaphoretic. No erythema. No pallor.  Psychiatric: She has a normal mood and affect. Her behavior is normal.   Results for orders placed or performed in visit on 09/21/16  POCT urinalysis dipstick  Result Value Ref Range   Color, UA yellow yellow   Clarity, UA clear clear   Glucose, UA negative negative   Bilirubin, UA negative negative   Ketones, POC UA negative negative   Spec Grav, UA 1.010    Blood, UA negative negative   pH, UA 7.0    Protein Ur, POC negative negative   Urobilinogen, UA 0.2    Nitrite, UA Negative Negative  Leukocytes, UA Negative Negative   Depression screen [facility] 2/9 09/21/2016 03/22/2016 01/11/2016 09/07/2015 08/21/2015  Decreased Interest 0 0 0 0 0  Down, Depressed, Hopeless 0 0 0 0 0  PHQ - 2 Score 0 0 0 0 0   Fall Risk  09/21/2016 03/22/2016 01/11/2016 09/07/2015 03/12/2015  Falls in the past year? No No No No No  Number falls in  past yr: - - - - -  Injury with Fall? - - - - -       Assessment & Plan:   1. Essential hypertension   2. Hypercholesteremia   3. Alcoholism in recovery (HCC)   4. History of prescription drug abuse (HCC)   5. GAD (generalized anxiety disorder)   6. Slow transit constipation   7. Gastroesophageal reflux disease without esophagitis   8. Acute right-sided low back pain without sciatica   9. Need for prophylactic vaccination and inoculation against influenza    -stable; obtain labs; refills provided. -obtain urine with current R lumbar strain; no suggestion of urinary process. -L knee with lateral knee pain; rest, ice, Mobic daily for one week; home exercise program provided; pt declined xray.  Will return in 3 weeks if no improvement for xray. -emotionally stable.  Orders Placed This Encounter  Procedures  . Flu Vaccine QUAD 36+ mos IM  . CBC with Differential/Platelet  . Comprehensive metabolic panel    Order Specific Question:   Has the patient fasted?    Answer:   Yes  . Lipid panel    Order Specific Question:   Has the patient fasted?    Answer:   Yes  . POCT urinalysis dipstick   Meds ordered this encounter  Medications  . doxylamine, Sleep, (UNISOM) 25 MG tablet    Sig: Take 25 mg by mouth at bedtime as needed.    Return in about 6 months (around 03/22/2017) for complete physical examiniation.   Reverie Vaquera Paulita Fujita, M.D. [facility] [address] [place], [place]  [phone] phone [phone] fax

## 2016-09-21 NOTE — Patient Instructions (Signed)
   IF you received an x-ray today, you will receive an invoice from [facility]. Please contact [facility] at [phone] with questions or concerns regarding your invoice.   IF you received labwork today, you will receive an invoice from [facility]. Please contact [facility] at [phone] with questions or concerns regarding your invoice.   Our billing staff will not be able to assist you with questions regarding bills from these companies.  You will be contacted with the lab results as soon as they are available. The fastest way to get your results is to activate your My Chart account. Instructions are located on the last page of this paperwork. If you have not heard from us regarding the results in 2 weeks, please contact this office.    Lateral Collateral Knee Ligament Sprain With Phase I Rehab The lateral collateral ligament (LCL) of the knee helps hold the knee joint in proper alignment and prevents the bones from shifting out of alignment (displacing) toward the outside (laterally). Injury to the knee may cause a tear in the LCL ligament (sprain). The LCL is the least common ligament of the knee to be injured. Sprains may heal on their own, but they often result in a loose joint. Sprains are classified into three categories. Grade 1 sprains cause pain, but the tendon is not lengthened. Grade 2 sprains include a lengthened ligament, due to the ligament being stretched or partially ruptured. With grade 2 sprains there is still function, although the function may be decreased. Grade 3 sprains involve a complete tear of the tendon or muscle, and function is usually impaired. SYMPTOMS   Pain and tenderness on the outer side of the knee.  A "pop," tearing, or pulling sensation at the time of injury.  Bruising (contusion) at the site of injury within 48 hours of injury.  Knee stiffness.  Limping, often walking with the knee bent. CAUSES  An  LCL sprain occurs when a force is placed on the ligament that is greater than it can handle. Common causes of injury include:  Direct hit (trauma) to the inner side of the knee, especially if the foot is planted on the ground.  Forceful pivoting of the body and leg while the foot is planted on the ground. RISK INCREASES WITH:  Contact sports (football, rugby).  Sports that require pivoting or cutting (soccer).  Poor knee strength and flexibility.  Improper equipment use. PREVENTION   Warm up and stretch properly before activity.  Maintain physical fitness:  Strength, flexibility, and endurance.  Cardiovascular fitness.  Wear properly fitted protective equipment (correct length of cleats for surface).  Functional braces may be effective in preventing injury. PROGNOSIS  If treated properly, LCL tears usually heal on their own. Sometimes, surgery is required. RELATED COMPLICATIONS   Frequently recurring symptoms, such as knee giving way, instability, and swelling.  Injury to other structures in the knee joint.  Meniscal cartilage, resulting in locking and swelling of the knee.  Articular cartilage, resulting in knee arthritis.  Other ligaments of the knee (commonly).  Injury to nerves, causing numbness of the outer leg, foot, and ankle and weakness or paralysis, with inability to raise the ankle, big toe, or lesser toes.  Knee stiffness (loss of knee motion). TREATMENT  Treatment first involves the use of ice and medicine to reduce pain and inflammation. The use of strengthening and stretching exercises may help reduce pain with activity. These exercises may be performed at home, but referral to   a therapist is often advised. You may be advised to walk with crutches until you are able to walk without a limp. Your caregiver may provide you with a hinged knee brace to help regain a full range of motion while also protecting the injured knee. For severe LCL injuries, or injuries  that involve other ligaments of the knee, surgery is often advised. MEDICATION   If pain medicine is needed, nonsteroidal anti-inflammatory medicines (aspirin and ibuprofen), or other minor pain relievers (acetaminophen), are often advised.  Do not take pain medicine for 7 days before surgery.  Prescription pain relievers may be given, if your caregiver thinks they are needed. Use only as directed and only as much as you need. HEAT AND COLD  Cold treatment (icing) should be applied for 10 to 15 minutes every 2 to 3 hours for inflammation and pain, and immediately after activity that aggravates your symptoms. Use ice packs or an ice massage.  Heat treatment may be used before performing stretching and strengthening activities prescribed by your caregiver, physical therapist, or athletic trainer. Use a heat pack or a warm water soak. SEEK MEDICAL CARE IF:   Symptoms get worse or do not improve in 4 to 6 weeks, despite treatment.  New, unexplained symptoms develop. (Drugs used in treatment may produce side effects.) EXERCISES RANGE OF MOTION (ROM) AND STRETCHING EXERCISES - Lateral Collateral Knee Ligament Sprain Phase I These are some of the initial exercises that your physician, physical therapist or athletic trainer may have you perform to begin your rehabilitation. When you demonstrate gains in your flexibility and strength, your caregiver may progress you to Phase II exercises. As you perform these exercises, remember:   These initial exercises are intended to be gentle. They will help you restore motion without increasing any swelling.  Completing these exercises allows less painful movement and prepares you for the more aggressive strengthening exercises in Phase II.  An effective stretch should be held for at least 30 seconds.  A stretch should never be painful. You should only feel a gentle lengthening or release in the stretched tissue. RANGE OF MOTION - Knee Flexion,  Active  Lie on your back with both knees straight. (If this causes back discomfort, bend your opposite knee, placing your foot flat on the floor.)  Slowly slide your heel back toward your buttocks until you feel a gentle stretch in the front of your knee or thigh.  Hold for __________ seconds. Slowly slide your heel back to the starting position. Repeat __________ times. Complete this exercise __________ times per day.  STRETCH - Knee Flexion, Supine  Lie on the floor with your right / left heel and foot lightly touching the wall. (Place both feet on the wall, if you do not use a door frame.)  Without using any effort, allow gravity to slide your foot down the wall slowly until you feel a gentle stretch in the front of your right / left knee.  Hold this stretch for __________ seconds. Then return the leg to the starting position, using your healthy leg for help, if needed. Repeat __________ times. Complete this stretch __________ times per day.  RANGE OF MOTION - Knee Flexion and Extension, Active-Assisted  Sit on the edge of a table or chair with your thighs firmly supported. It may be helpful to place a folded towel under the end of your right / left thigh.  Flexion (bending): Place the ankle of your healthy leg on top of the other ankle. Use   your healthy leg to gently bend your right / left knee until you feel a mild tension across the top of your knee.  Hold for __________ seconds.  Extension (straightening): Switch your ankles so your right / left leg is on top. Use your healthy leg to straighten your right / left knee until you feel a mild tension on the backside of your knee.  Hold for __________ seconds. Repeat __________ times. Complete this exercise __________ times per day. STRETCH - Knee Extension Sitting  Sit with your right / left leg/heel propped on another chair, coffee table, or foot stool.  Allow your leg muscles to relax, letting gravity straighten out your  knee.*  You should feel a stretch behind your right / left knee. Hold this position for __________ seconds. Repeat __________ times. Complete this stretch __________ times per day.  *Your physician, physical therapist, or athletic trainer may instruct you place a __________ weight on your thigh, just above your kneecap, to deepen the stretch.  STRENGTHENING EXERCISES Lateral Collateral Knee Ligament Sprain - Phase I These exercises may help you when beginning to rehabilitate your injury. They may resolve your symptoms with or without further involvement from your physician, physical therapist, or athletic trainer. While completing these exercises, remember:   Muscles can gain both the endurance and the strength needed for everyday activities through controlled exercises.  Complete these exercises as instructed by your physician, physical therapist or athletic trainer. Increase the resistance and repetitions only as guided.  In order to return to more demanding activities, you will likely need to progress to more challenging exercises. Your physician, physical therapist or athletic trainer will advance your exercises when your tissues show adequate healing and your muscles demonstrate increased strength. STRENGTH - Quadriceps, Isometrics  Lie on your back with your right / left leg extended and your opposite knee bent.  Gradually tense the muscles in the front of your right / left thigh. You should see either your kneecap slide up toward your hip or increased dimpling just above the knee. This motion will push the back of the knee down toward the floor, mat, or bed on which you are lying.  Hold the muscle as tight as you can without increasing your pain for __________ seconds.  Relax the muscles slowly and completely between each repetition. Repeat __________ times. Complete this exercise __________ times per day.  STRENGTH - Quadriceps, Short Arcs   Lie on your back. Place a __________ inch  towel roll under your right / left knee, so that the knee bends slightly.  Raise only your lower leg by tightening the muscles in the front of your thigh. Do not allow your thigh to rise.  Hold this position for __________ seconds. Repeat __________ times. Complete this exercise __________ times per day.  OPTIONAL ANKLE WEIGHTS: Begin with ____________________, but DO NOT exceed ____________________. Increase in 1 pound/0.5 kilogram increments. STRENGTH - Quadriceps, Straight Leg Raises  Quality counts! Watch for signs that the quadriceps muscle is working, to be sure you are strengthening the correct muscles and not "cheating" by substituting with healthier muscles.  Lie on your back with your right / left leg extended and your opposite knee bent.  Tense the muscles in the front of your right / left thigh. You should see either your kneecap slide up or increased dimpling just above the knee. Your thigh may even shake a bit.  Tighten these muscles even more and raise your leg 4 to 6 inches off the   floor. Hold for __________ seconds.  Keeping these muscles tense, lower your leg.  Relax the muscles slowly and completely in between each repetition. Repeat __________ times. Complete this exercise __________ times per day.  STRENGTH - Hamstring, Isometrics   Lie on your back, on a firm surface.  Bend your right / left knee approximately __________ degrees.  Dig your heel into the surface as if you are trying to pull it toward your buttocks. Tighten the muscles in the back of your thighs to "dig" as hard as you can, without increasing any pain.  Hold this position for __________ seconds.  Release the tension gradually and allow your muscle to completely relax for __________ seconds in between each exercise. Repeat __________ times. Complete this exercise __________ times per day.  STRENGTH - Hamstring, Curls   Lie on your stomach with your legs extended. (If you lie on a bed, your feet  may hang over the edge.)  Tighten the muscles in the back of your thigh to bend your right / left knee up to 90 degrees. Keep your hips flat on the bed.  Hold this position for __________ seconds.  Slowly lower your leg back to the starting position. Repeat __________ times. Complete this exercise __________ times per day.  OPTIONAL ANKLE WEIGHTS: Begin with ____________________, but DO NOT exceed ____________________. Increase in 1 pound/0.5 kilogram increments.   This information is not intended to replace advice given to you by your health care provider. Make sure you discuss any questions you have with your health care provider.   Document Released: 12/05/2005 Document Revised: 12/26/2014 Document Reviewed: 03/19/2009 [employer] Interactive Patient Education 2016 [employer]  

## 2016-09-27 ENCOUNTER — Ambulatory Visit: Payer: Self-pay | Admitting: Family Medicine

## 2016-10-11 ENCOUNTER — Ambulatory Visit
Admission: RE | Admit: 2016-10-11 | Discharge: 2016-10-11 | Disposition: A | Discharge status: Home / Self Care | Payer: PPO | Source: Ambulatory Visit | Attending: Sports Medicine | Admitting: Sports Medicine

## 2016-10-11 ENCOUNTER — Ambulatory Visit (INDEPENDENT_AMBULATORY_CARE_PROVIDER_SITE_OTHER): Payer: PPO | Admitting: Sports Medicine

## 2016-10-11 ENCOUNTER — Encounter: Payer: Self-pay | Admitting: Sports Medicine

## 2016-10-11 VITALS — BP 140/84 | HR 75 | Ht 61.0 in | Wt 130.0 lb

## 2016-10-11 DIAGNOSIS — M25562 Pain in left knee: Secondary | ICD-10-CM

## 2016-10-11 DIAGNOSIS — M179 Osteoarthritis of knee, unspecified: Secondary | ICD-10-CM | POA: Diagnosis not present

## 2016-10-11 NOTE — Progress Notes (Addendum)
   Subjective:    Patient ID: Cheryl Kent, female    DOB: 12/13/1946, 70 y.o.   MRN: [id number]  HPI chief complaint: Left knee pain  Very pleasant 70 year old female comes in today complaining of 3 weeks of left knee pain. She does not recall any specific injury but does state that her pain began after she added a new exercise to her workout regimen. This exercise consisted of "windmill "exercises which require her to internally and external he rotate her hips with her knees bent while lying on the floor. She is complaining of pain primarily in the lateral aspect of the left knee but it does radiate to the rest of the knee at times. She has not noticed any swelling but does state that she gets stiffness as well as a feeling of tightness in her knee. She also describes a sensation of the knee locking when she holds it in an extended position while lying in her recliner. This does require her to manipulate her knee to relieve the pressure. This only occurs when lying in a certain position. Despite her discomfort she has been able to continue exercising. She denies any significant problems with her knee in the past. She has noticed some weakness in the knee. She tried some meloxicam but it was not helpful. No prior knee surgeries. No hip pain. No numbness or tingling. She has not had any imaging.  Past medical history reviewed Medications reviewed Allergies reviewed    Review of Systems    as above Objective:   Physical Exam  Well-developed, well-nourished. No acute distress. Sitting In the exam room. Vital signs reviewed  Left knee: Full range of motion. No obvious effusion. She is tender to palpation along the lateral joint line but has a negative Thessaly's. No tenderness to palpation along the medial joint line. Knee is grossly stable to ligamentous exam. Neurovascular intact distally.  Right knee: Full range of motion. No effusion. No tenderness to palpation. Good ligament stability.  Good strength. Neurovascularly intact distally.  MSK ultrasound of the left knee was performed. Limited images were obtained. There is a small knee effusion. Significant spurring with a probable degenerative lateral meniscal tear is seen in the lateral compartment. Visualized portion of the medial compartment is fairly unremarkable.      Assessment & Plan:   Left knee pain likely secondary to lateral compartmental DJD  Patient will be given a body helix compression sleeve to wear with activity. She is given a home exercise program consisting of quad and hamstring strengthening exercises to be done daily. She is instructed to ice her knee at the end of activity. I would like to get some x-rays to evaluate the degree of arthritis present, particularly in the lateral compartment. I explained to the patient that if her symptoms persist despite today's treatment then I may offer her a cortisone injection. She will follow-up with me in 4-6 weeks for reevaluation. Call with questions or concerns in the interim.  Addendum: X-rays reviewed. Patient has moderately advanced lateral compartmental DJD particularly on the 30 flexion view. Proceed with treatment as outlined above.

## 2016-11-08 ENCOUNTER — Ambulatory Visit (INDEPENDENT_AMBULATORY_CARE_PROVIDER_SITE_OTHER): Payer: PPO | Admitting: Sports Medicine

## 2016-11-08 ENCOUNTER — Encounter: Payer: Self-pay | Admitting: Sports Medicine

## 2016-11-08 DIAGNOSIS — M1712 Unilateral primary osteoarthritis, left knee: Secondary | ICD-10-CM | POA: Diagnosis not present

## 2016-11-08 MED ORDER — METHYLPREDNISOLONE ACETATE 40 MG/ML IJ SUSP
40.0000 mg | Freq: Once | INTRAMUSCULAR | Status: AC
  Administered 2016-11-08: 40 mg via INTRA_ARTICULAR

## 2016-11-08 NOTE — Progress Notes (Addendum)
Cheryl Kent - 70 y.o. female MRN [id number]  Date of birth: [birth year]  SUBJECTIVE:  Including CC & ROS.   Cheryl Kent is a 69 y.o. F presentign for f/u of L knee pain.  Patienht was last seen on 10/11/16 and found to have probable degenerative lateral meniscal tear in L knee and moderate lateral compartment DJD of L knee.  Treated with body helix sleeve with activity, HEP for quad and hamstring strengthening, and ice.  Patient reports that since that time she is feeling a little bit better, but continues to have intermittent lateral L knee pain, especially when going up or down stairs.  She is wearing body helix with activity that seems to help.  She is doing HEP at least twice daily.  She reports that she was treated for latent TB many years ago and thinks she was told to avoid cortisone.   HISTORY: Past Medical, Surgical, Social, and Family History Reviewed & Updated per EMR.   Pertinent Historical Findings include: PMSHx -  HTN, s/p treatment for latent TB PSHx -  In recovery for alcoholism FHx -  noncontributory Medications - reviewed  DATA REVIEWED: Limited L knee [place] (10/11/16) - Significant spurring with a probable degenerative lateral meniscal tear is seen in the lateral compartment. Visualized portion of the medial compartment is fairly unremarkable.  Knee XRays (10/11/16) - Patient has moderately advanced lateral compartmental DJD particularly on the 30 flexion view.  PHYSICAL EXAM:  VS: BP:(!) 143/90  HR:75bpm  TEMP: ( )  RESP:   HT:5\' 1"  (154.9 cm)   WT:130 lb (59 kg)  BMI:24.6 PHYSICAL EXAM: Gen: NAD, alert, cooperative with exam, well-appearing HEENT: clear conjunctiva, EOMI CV:  no edema, capillary refill brisk,  Resp: non-labored, normal speech Skin: no rashes, normal turgor  Neuro: no gross deficits.  Psych:  alert and oriented  L Knee: Normal to inspection with no erythema or effusion or obvious bony abnormalities. Palpation normal with no warmth or  joint line tenderness or patellar tenderness or condyle tenderness. ROM normal in flexion and extension and lower leg rotation. Ligaments with solid consistent endpoints including ACL, PCL, LCL, MCL. Negative Mcmurray's. Non painful patellar compression. Patellar and quadriceps tendons unremarkable. Hamstring and quadriceps strength is normal.   ASSESSMENT & PLAN:   Osteoarthritis of left knee Noted moderate DJD of lateral compartment of L knee - Cortisone injection done today - continue HEP for strengthening - continue to use body helix prn for activity - f/u prn and consider HA injections if no improvement with cortisone   Virginia Crews, MD, MPH PGY-3,  [facility] 11/08/2016 12:25 PM    Patient seen and evaluated with the resident. I agree with the above plan of care. This patient has moderately advanced lateral compartmental DJD of her left knee. I had a long discussion with her regarding treatment options. She would like to proceed with a cortisone injection today. Of note, we did discuss the possibility of an intra-articular cortisone injection with infectious disease and they reassured [place] that there is no contraindication to doing this procedure with this patient's case of latent TB. Patient tolerated the procedure without difficulty.  We discussed the merits of Visco supplementation if cortisone was not helpful. We also discussed referral for a discussion of a possible partial knee replacement versus total knee replacement but she is not interested in surgery at this time. She needs to continue with her home exercises and will modify her workouts based on pain.  Follow-up with me as needed.  Consent obtained and verified. Time-out conducted. Noted no overlying erythema, induration, or other signs of local infection. Skin prepped in a sterile fashion. Topical analgesic spray: Ethyl chloride. Joint: left knee, anterior lateral approach Needle: 22g 1.5  inch Completed without difficulty. Meds: 3cc 1% xylocaine, 1cc (40mg ) depomedrol  Advised to call if fevers/chills, erythema, induration, drainage, or persistent bleeding.

## 2016-11-08 NOTE — Assessment & Plan Note (Signed)
Noted moderate DJD of lateral compartment of L knee - Cortisone injection done today - continue HEP for strengthening - continue to use body helix prn for activity - f/u prn and consider HA injections if no improvement with cortisone

## 2016-11-15 DIAGNOSIS — L309 Dermatitis, unspecified: Secondary | ICD-10-CM | POA: Diagnosis not present

## 2016-12-29 ENCOUNTER — Other Ambulatory Visit: Payer: Self-pay | Admitting: Family Medicine

## 2017-01-06 ENCOUNTER — Encounter: Payer: Self-pay | Admitting: Family Medicine

## 2017-02-01 ENCOUNTER — Ambulatory Visit (INDEPENDENT_AMBULATORY_CARE_PROVIDER_SITE_OTHER): Payer: PPO | Admitting: Family Medicine

## 2017-02-01 ENCOUNTER — Ambulatory Visit (INDEPENDENT_AMBULATORY_CARE_PROVIDER_SITE_OTHER): Payer: PPO

## 2017-02-01 ENCOUNTER — Encounter: Payer: Self-pay | Admitting: Family Medicine

## 2017-02-01 VITALS — BP 118/78 | HR 85 | Temp 98.2°F | Resp 16 | Ht 61.0 in | Wt 132.2 lb

## 2017-02-01 DIAGNOSIS — M7061 Trochanteric bursitis, right hip: Secondary | ICD-10-CM

## 2017-02-01 DIAGNOSIS — M1712 Unilateral primary osteoarthritis, left knee: Secondary | ICD-10-CM

## 2017-02-01 DIAGNOSIS — M1611 Unilateral primary osteoarthritis, right hip: Secondary | ICD-10-CM | POA: Diagnosis not present

## 2017-02-01 MED ORDER — MELOXICAM 7.5 MG PO TABS: 7.5000 mg | ORAL_TABLET | Freq: Every day | ORAL | 0 refills | Status: DC

## 2017-02-01 NOTE — Patient Instructions (Addendum)
IF you received an x-ray today, you will receive an invoice from [facility]. Please contact [facility] at [phone] with questions or concerns regarding your invoice.   IF you received labwork today, you will receive an invoice from [facility]. Please contact [facility] at [phone] with questions or concerns regarding your invoice.   Our billing staff will not be able to assist you with questions regarding bills from these companies.  You will be contacted with the lab results as soon as they are available. The fastest way to get your results is to activate your My Chart account. Instructions are located on the last page of this paperwork. If you have not heard from [place] regarding the results in 2 weeks, please contact this office.      Trochanteric Bursitis Rehab Ask your health care provider which exercises are safe for you. Do exercises exactly as told by your health care provider and adjust them as directed. It is normal to feel mild stretching, pulling, tightness, or discomfort as you do these exercises, but you should stop right away if you feel sudden pain or your pain gets worse.Do not begin these exercises until told by your health care provider. Stretching exercises These exercises warm up your muscles and joints and improve the movement and flexibility of your hip. These exercises also help to relieve pain and stiffness. Exercise A: Iliotibial band stretch 1. Lie on your side with your left / right leg in the top position. 2. Bend your left / right knee and grab your ankle. 3. Slowly bring your knee back so your thigh is behind your body. 4. Slowly lower your knee toward the floor until you feel a gentle stretch on the outside of your left / right thigh. If you do not feel a stretch and your knee will not fall farther, place the heel of your other foot on top of your outer knee and pull your thigh down farther. 5. Hold this position for __________  seconds. 6. Slowly return to the starting position. Repeat __________ times. Complete this exercise __________ times a day. Strengthening exercises These exercises build strength and endurance in your hip and pelvis. Endurance is the ability to use your muscles for a long time, even after they get tired. Exercise B: Bridge (hip extensors) 1. Lie on your back on a firm surface with your knees bent and your feet flat on the floor. 2. Tighten your buttocks muscles and lift your buttocks off the floor until your trunk is level with your thighs. You should feel the muscles working in your buttocks and the back of your thighs. If this exercise is too easy, try doing it with your arms crossed over your chest. 3. Hold this position for __________ seconds. 4. Slowly return to the starting position. 5. Let your muscles relax completely between repetitions. Repeat __________ times. Complete this exercise __________ times a day. Exercise C: Squats (knee extensors and  quadriceps) 1. Stand in front of a table, with your feet and knees pointing straight ahead. You may rest your hands on the table for balance but not for support. 2. Slowly bend your knees and lower your hips like you are going to sit in a chair.  Keep your weight over your heels, not over your toes.  Keep your lower legs upright so they are parallel with the table legs.  Do not let your hips go lower than your knees.  Do not bend lower than told by your health  care provider.  If your hip pain increases, do not bend as low. 3. Hold this position for __________ seconds. 4. Slowly push with your legs to return to standing. Do not use your hands to pull yourself to standing. Repeat __________ times. Complete this exercise __________ times a day. Exercise D: Hip hike 1. Stand sideways on a bottom step. Stand on your left / right leg with your other foot unsupported next to the step. You can hold onto the railing or wall if needed for  balance. 2. Keeping your knees straight and your torso square, lift your left / right hip up toward the ceiling. 3. Hold this position for __________ seconds. 4. Slowly let your left / right hip lower toward the floor, past the starting position. Your foot should get closer to the floor. Do not lean or bend your knees. Repeat __________ times. Complete this exercise __________ times a day. Exercise E: Single leg stand 1. Stand near a counter or door frame that you can hold onto for balance as needed. It is helpful to stand in front of a mirror for this exercise so you can watch your hip. 2. Squeeze your left / right buttock muscles then lift up your other foot. Do not let your left / right hip push out to the side. 3. Hold this position for __________ seconds. Repeat __________ times. Complete this exercise __________ times a day. This information is not intended to replace advice given to you by your health care provider. Make sure you discuss any questions you have with your health care provider. Document Released: 01/12/2005 Document Revised: 08/11/2016 Document Reviewed: 11/20/2015 Elsevier Interactive Patient Education  2017 [employer].

## 2017-02-01 NOTE — Progress Notes (Signed)
Subjective:    Patient ID: Cheryl Kent, female    DOB: [birth year], 71 y.o.   MRN: [id number]  02/01/2017  Follow-up (pt would like to discuss issues with her left knee and right hip.)   HPI This 71 y.o. female presents for evaluation of persistent L KNEE PAIN and R HIP PAIN.  S/p consultation by Dr. Margaretha Sheffield in 09/2016; s/p xray knee that revealed mild to moderate degenerative  changes with small effusion.   S/p MSK ultrasound of L knee by Margaretha Sheffield; spurring along lateral meniscus tear in lateral compartment.  Provided with helix compression sleeve and given home exercise program.  If no improvement in 4-6 weeks, advised to return to office for cortisone injection.   Returned on 11/08/16 for steroid injection.   Discussed potential need for TKR in future if no improvement.  Has gone from running up and down the stairs when not able to go to the gym to now struggling to tolerate gym exercises at all.  Has continued to ice knee; also performing HEP.  Steroid injection helped some.  Pain is tolerable at this point.   Still going to the gym.  Now doing the bicycyle and elliptical. Doing some treadmill yet R hip is really painful with treadmill.    R HIP PAIN: Onset in past six weeks. Ten years ago, having R leg/hip pain with limping; diagnosed with HNP.  Physical therapy helped.  Doing bulging hip xrays.   For years, range of motion limited with external rotation.  Now pain on outside of hip.  Yoga seems to help with hip pain.  Taking Aleve every 24 hours for six times with relief.  Knows should not take Aleve a lot. Meloxicam is really old.  Has tried tart cherry juice.  Has never tried tumeric but cooks with it regularly.   Recovering alcoholic; also treated for tuberculosis in past.    Review of Systems  Constitutional: Negative for chills, diaphoresis, fatigue and fever.  Eyes: Negative for visual disturbance.  Respiratory: Negative for cough and shortness of breath.   Cardiovascular:  Negative for chest pain, palpitations and leg swelling.  Gastrointestinal: Negative for abdominal pain, constipation, diarrhea, nausea and vomiting.  Endocrine: Negative for cold intolerance, heat intolerance, polydipsia, polyphagia and polyuria.  Musculoskeletal: Positive for arthralgias, gait problem and joint swelling. Negative for back pain, myalgias, neck pain and neck stiffness.  Neurological: Negative for dizziness, tremors, seizures, syncope, facial asymmetry, speech difficulty, weakness, light-headedness, numbness and headaches.    Past Medical History:  Diagnosis Date  . Allergy    previous allergy shots.    . Anxiety   . Arthritis    DDD lumbar spine, hands B  . Cataract   . Fecal incontinence   . GERD (gastroesophageal reflux disease)   . Hypertension   . 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  2012.  Marland Kitchen Rectal prolapse   . Rosacea conjunctivitis   . Substance abuse   . Tuberculosis 2012   Past Surgical History:  Procedure Laterality Date  . BREAST SURGERY     #1 age 88 yo  #2 age 64's  . EYE SURGERY Left 10/07/2014   Cataract removal- Dr. Elmer Picker  . EYE SURGERY Right 11/18/2014   Cataract removal- Dr. Elmer Picker  . FOOT SURGERY    . HAND SURGERY    . SHOULDER SURGERY    . TONSILLECTOMY AND ADENOIDECTOMY     childhood  . TUBAL LIGATION  Allergies  Allergen Reactions  . Penicillins Other (See Comments)    infant  . Pneumococcal Vaccines   . Sulfamethoxazole-Trimethoprim   . Sulfonamide Derivatives     Social History   Social History  . Marital status: Widowed    Spouse name: N/A  . Number of children: 0  . Years of education: N/A   Occupational History  . retired    Social History Main Topics  . Smoking status: Former Smoker    Packs/day: 1.50    Years: 25.00    Types: Cigarettes    Quit date: 12/20/1983  . Smokeless tobacco: Never Used  . Alcohol use No     Comment: former heavy use/alcoholism; quit in  1980 with treatment  . Drug use: No     Comment: former abuse of prescription drugs during 1970s.  . Sexual activity: No   Other Topics Concern  . Not on file   Social History Narrative   Marital status:  Widowed since 2007.  Widowed twice.  Not dating but interested.      Children: none      Lives: alone in house.      Employment:  Retired; at age 68; pet sits alot      Tobacco: quit 30 years ago      Alcohol: quit in 1980; in recovery since 1980s.      Drugs: none now; history of prescription drug abuse (Valium, anything I could get) in 1970s.      Exercise: cardio,weights, and abs 5 x a week for 1 hour      ADLs: independent with ADLs; drives; no assistant devices.      Advanced Directives; yes; stepdaughter is HCPOA; DNI.   Family History  Problem Relation Age of Onset  . Stroke Mother 37    multiple CVAs  . Hypertension Mother   . Hyperlipidemia Mother   . Osteoporosis Mother   . Mental illness Mother     paranoid schizophrenia  . Alzheimer's disease Father   . Diabetes Father   . Hypertension Father   . Cancer Father     prostate  . Osteoporosis Sister   . Kidney disease Sister   . Stroke Sister   . Hyperlipidemia Sister   . Hypertension Sister   . Mental illness Sister     depression; multiple behavior health admissions  . Cancer Sister     leukemia  . Osteoporosis Maternal Grandmother   . Stroke Maternal Grandmother   . Osteoporosis Paternal Grandmother   . Alzheimer's disease Paternal Grandfather   . Cancer Cousin     MATERNAL IST COUSIN - BREAST CA       Objective:    BP 118/78   Pulse 85   Temp 98.2 F (36.8 C) (Oral)   Resp 16   Ht 5\' 1"  (1.549 m)   Wt 132 lb 3.2 oz (60 kg)   SpO2 96%   BMI 24.98 kg/m  Physical Exam  Constitutional: She is oriented to person, place, and time. She appears well-developed and well-nourished. No distress.  HENT:  Head: Normocephalic and atraumatic.  Eyes: Conjunctivae are normal. Pupils are equal, round, and  reactive to light.  Neck: Normal range of motion. Neck supple.  Cardiovascular: Normal rate, regular rhythm and normal heart sounds.  Exam reveals no gallop and no friction rub.   No murmur heard. Pulmonary/Chest: Effort normal and breath sounds normal. She has no wheezes. She has no rales.  Musculoskeletal:  Right hip: She exhibits tenderness and bony tenderness. She exhibits normal strength, no swelling, no crepitus, no deformity and no laceration.       Right knee: Normal. She exhibits normal range of motion.       Left knee: She exhibits normal range of motion, no swelling, no effusion and normal patellar mobility. Tenderness found. Medial joint line tenderness noted.       Right upper leg: Normal. She exhibits no tenderness and no bony tenderness.       Left upper leg: Normal. She exhibits no tenderness.  R HIP: +TTP lateral hip at bursa.  Full ROM of R hip without pain.  Motor 5/5 all direction R hip.   Neurological: She is alert and oriented to person, place, and time.  Skin: She is not diaphoretic.  Psychiatric: She has a normal mood and affect. Her behavior is normal.  Nursing note and vitals reviewed.  Depression screen [facility] 2/9 02/01/2017 11/08/2016 09/21/2016 03/22/2016 01/11/2016  Decreased Interest 0 0 0 0 0  Down, Depressed, Hopeless 0 0 0 0 0  PHQ - 2 Score 0 0 0 0 0        Assessment & Plan:   1. Primary osteoarthritis of left knee   2. Greater trochanteric bursitis of right hip    -s/p evaluation of L knee OA by sports medicine; continue to follow-up with [facility]. -new onset R hip pain highly suggestive of greater trochanteric bursitis; obtain R hip films to evaluate for OA; rx for Mobic provided; home exercise program provided.  If no improvement in one month, call for sports medicine referral.    Orders Placed This Encounter  Procedures  . DG HIP UNILAT W OR W/O PELVIS 2-3 VIEWS RIGHT    Standing Status:   Future    Number of Occurrences:   1    Standing  Expiration Date:   02/01/2018    Order Specific Question:   Reason for Exam (SYMPTOM  OR DIAGNOSIS REQUIRED)    Answer:   R hip pain lateral aspect    Order Specific Question:   Preferred imaging location?    Answer:   External   Meds ordered this encounter  Medications  . meloxicam (MOBIC) 7.5 MG tablet    Sig: Take 1 tablet (7.5 mg total) by mouth daily.    Dispense:  90 tablet    Refill:  0    No Follow-up on file.   Amani Marseille Paulita Fujita, M.D. Primary [facility] at [facility] [address] [place], [place]  [address] [phone] phone [phone] fax

## 2017-02-16 DIAGNOSIS — H04123 Dry eye syndrome of bilateral lacrimal glands: Secondary | ICD-10-CM | POA: Diagnosis not present

## 2017-02-16 DIAGNOSIS — H40013 Open angle with borderline findings, low risk, bilateral: Secondary | ICD-10-CM | POA: Diagnosis not present

## 2017-02-16 DIAGNOSIS — H1013 Acute atopic conjunctivitis, bilateral: Secondary | ICD-10-CM | POA: Diagnosis not present

## 2017-02-16 DIAGNOSIS — H01003 Unspecified blepharitis right eye, unspecified eyelid: Secondary | ICD-10-CM | POA: Diagnosis not present

## 2017-03-09 ENCOUNTER — Telehealth: Payer: Self-pay | Admitting: Family Medicine

## 2017-03-09 NOTE — Telephone Encounter (Signed)
LMOM FOR PT TO CALL BACK TO RESCHEDULE APPOINTMENT THAT SHE HAD WITH SMITH ON 03-28-17 SHE WILL NOT BE IN OFFICE THAT DAY

## 2017-03-28 ENCOUNTER — Encounter: Payer: PPO | Admitting: Family Medicine

## 2017-04-12 ENCOUNTER — Encounter: Payer: Self-pay | Admitting: Family Medicine

## 2017-04-12 ENCOUNTER — Ambulatory Visit (INDEPENDENT_AMBULATORY_CARE_PROVIDER_SITE_OTHER): Payer: PPO | Admitting: Family Medicine

## 2017-04-12 VITALS — BP 114/71 | HR 96 | Temp 97.7°F | Resp 16 | Ht 61.0 in | Wt 132.6 lb

## 2017-04-12 DIAGNOSIS — E7800 Pure hypercholesterolemia, unspecified: Secondary | ICD-10-CM

## 2017-04-12 DIAGNOSIS — M1712 Unilateral primary osteoarthritis, left knee: Secondary | ICD-10-CM

## 2017-04-12 DIAGNOSIS — E2839 Other primary ovarian failure: Secondary | ICD-10-CM | POA: Diagnosis not present

## 2017-04-12 DIAGNOSIS — F1921 Other psychoactive substance dependence, in remission: Secondary | ICD-10-CM

## 2017-04-12 DIAGNOSIS — F5104 Psychophysiologic insomnia: Secondary | ICD-10-CM

## 2017-04-12 DIAGNOSIS — Q383 Other congenital malformations of tongue: Secondary | ICD-10-CM

## 2017-04-12 DIAGNOSIS — K219 Gastro-esophageal reflux disease without esophagitis: Secondary | ICD-10-CM | POA: Diagnosis not present

## 2017-04-12 DIAGNOSIS — Z0000 Encounter for general adult medical examination without abnormal findings: Secondary | ICD-10-CM | POA: Diagnosis not present

## 2017-04-12 DIAGNOSIS — K623 Rectal prolapse: Secondary | ICD-10-CM

## 2017-04-12 DIAGNOSIS — Z1231 Encounter for screening mammogram for malignant neoplasm of breast: Secondary | ICD-10-CM | POA: Diagnosis not present

## 2017-04-12 DIAGNOSIS — F1021 Alcohol dependence, in remission: Secondary | ICD-10-CM | POA: Diagnosis not present

## 2017-04-12 DIAGNOSIS — F411 Generalized anxiety disorder: Secondary | ICD-10-CM

## 2017-04-12 DIAGNOSIS — I10 Essential (primary) hypertension: Secondary | ICD-10-CM | POA: Diagnosis not present

## 2017-04-12 DIAGNOSIS — Z1211 Encounter for screening for malignant neoplasm of colon: Secondary | ICD-10-CM

## 2017-04-12 DIAGNOSIS — Z23 Encounter for immunization: Secondary | ICD-10-CM | POA: Diagnosis not present

## 2017-04-12 DIAGNOSIS — IMO0002 Reserved for concepts with insufficient information to code with codable children: Secondary | ICD-10-CM

## 2017-04-12 LAB — POCT URINALYSIS DIP (MANUAL ENTRY)
BILIRUBIN UA: NEGATIVE
BILIRUBIN UA: NEGATIVE mg/dL
GLUCOSE UA: NEGATIVE mg/dL
Leukocytes, UA: NEGATIVE
Nitrite, UA: NEGATIVE
Protein Ur, POC: NEGATIVE mg/dL
RBC UA: NEGATIVE
UROBILINOGEN UA: 0.2 U/dL
pH, UA: 6.5 (ref 5.0–8.0)

## 2017-04-12 MED ORDER — POLYETHYLENE GLYCOL 3350 17 GM/SCOOP PO POWD: 17.0000 g | Freq: Every day | ORAL | 3 refills | Status: DC

## 2017-04-12 MED ORDER — LISINOPRIL-HYDROCHLOROTHIAZIDE 20-25 MG PO TABS: 1.0000 | ORAL_TABLET | Freq: Every day | ORAL | 3 refills | Status: DC

## 2017-04-12 MED ORDER — OMEPRAZOLE 20 MG PO CPDR: 20.0000 mg | DELAYED_RELEASE_CAPSULE | Freq: Every day | ORAL | 3 refills | Status: DC

## 2017-04-12 MED ORDER — TRAMADOL HCL 50 MG PO TABS: ORAL_TABLET | ORAL | 0 refills | Status: DC

## 2017-04-12 MED ORDER — ROSUVASTATIN CALCIUM 10 MG PO TABS: 10.0000 mg | ORAL_TABLET | Freq: Every day | ORAL | 3 refills | Status: DC

## 2017-04-12 MED ORDER — ZOSTER VAC RECOMB ADJUVANTED 50 MCG/0.5ML IM SUSR: 0.5000 mL | Freq: Once | INTRAMUSCULAR | 1 refills | Status: AC

## 2017-04-12 MED ORDER — HYDROCORTISONE 2.5 % RE CREA: 1.0000 "application " | TOPICAL_CREAM | Freq: Two times a day (BID) | RECTAL | 1 refills | Status: DC

## 2017-04-12 MED ORDER — MELOXICAM 7.5 MG PO TABS: 7.5000 mg | ORAL_TABLET | Freq: Every day | ORAL | 1 refills | Status: DC

## 2017-04-12 NOTE — Progress Notes (Signed)
Subjective:    Patient ID: Cheryl Kent, female    DOB: 17-Feb-1946, 71 y.o.   MRN: [id number]  04/12/2017  Annual Exam   HPI This 71 y.o. female presents for Annual Wellness Examination and follow-up of chronic medical conditions.  Last physical: Pap smear: n/a Mammogram: 2017 Colonoscopy: 2013 Dr. Loreta Ave; repeat in ten years.   Bone density: 03-2015 osteopenia.   Eye exam:  +glasses; followed every six months. Dental exam:  Every six months.  Dentures; upper plate; partial plate.    Immunization History  Administered Date(s) Administered  . Influenza,inj,Quad PF,36+ Mos 09/21/2016  . Influenza-Unspecified 08/19/2013, 09/23/2014, 09/24/2015  . Pneumococcal Conjugate-13 09/07/2015  . Pneumococcal Polysaccharide-23 12/19/2006  . Td 07/20/2011  . Tdap 12/20/2004  . Zoster 12/24/2006  . Zoster Recombinat (Shingrix) 04/12/2017   BP Readings from Last 3 Encounters:  04/12/17 114/71  02/01/17 118/78  11/08/16 (!) 143/90   Wt Readings from Last 3 Encounters:  04/12/17 132 lb 9.6 oz (60.1 kg)  02/01/17 132 lb 3.2 oz (60 kg)  11/08/16 130 lb (59 kg)   HTN: Patient reports good compliance with medication, good tolerance to medication, and good symptom control.    Hypercholesterolemia: Patient reports good compliance with medication, good tolerance to medication, and good symptom control.     Anxiety and depression: Patient reports good compliance with medication, good tolerance to medication, and good symptom control.    Insomnia: Patient reports good compliance with medication, good tolerance to medication, and good symptom control.    GERD: Patient reports good compliance with medication, good tolerance to medication, and good symptom control.  Zantac bid without improvement; persistent symptoms.  Took PPI for years; stopped a couple of years ago.   Fecal incontinence/rectal prolapse: stable.  R hip pain: much better; s/p xray at last visit 01/2017; much better; only hurts  with steps.  L knee pain: s/p steroid injection five weeks ago; knee is a little bit better.  Tumeric two daily.  Knee exercises daily; icing daily.  Saw Draper.  s/p xray by Dr. Margaretha Sheffield; mild to moderate degenerative changes.  Still moves around.  s/p injection steroid L knee.    Review of Systems  Constitutional: Negative for activity change, appetite change, chills, diaphoresis, fatigue, fever and unexpected weight change.  HENT: Negative for congestion, dental problem, drooling, ear discharge, ear pain, facial swelling, hearing loss, mouth sores, nosebleeds, postnasal drip, rhinorrhea, sinus pressure, sneezing, sore throat, tinnitus, trouble swallowing and voice change.   Eyes: Negative for photophobia, pain, discharge, redness, itching and visual disturbance.  Respiratory: Negative for apnea, cough, choking, chest tightness, shortness of breath, wheezing and stridor.   Cardiovascular: Negative for chest pain, palpitations and leg swelling.  Gastrointestinal: Negative for abdominal distention, abdominal pain, anal bleeding, blood in stool, constipation, diarrhea, nausea, rectal pain and vomiting.       +reflux regularly.    Endocrine: Negative for cold intolerance, heat intolerance, polydipsia, polyphagia and polyuria.  Genitourinary: Negative for decreased urine volume, difficulty urinating, dyspareunia, dysuria, enuresis, flank pain, frequency, genital sores, hematuria, menstrual problem, pelvic pain, urgency, vaginal bleeding, vaginal discharge and vaginal pain.       Nocturia x 3-5.  No urinary leakage.   Musculoskeletal: Positive for arthralgias. Negative for back pain, gait problem, joint swelling, myalgias, neck pain and neck stiffness.  Skin: Negative for color change, pallor, rash and wound.  Allergic/Immunologic: Negative for environmental allergies, food allergies and immunocompromised state.  Neurological: Negative for dizziness, tremors, seizures, syncope, facial  asymmetry, speech  difficulty, weakness, light-headedness, numbness and headaches.  Hematological: Negative for adenopathy. Does not bruise/bleed easily.  Psychiatric/Behavioral: Positive for sleep disturbance. Negative for agitation, behavioral problems, confusion, decreased concentration, dysphoric mood, hallucinations, self-injury and suicidal ideas. The patient is nervous/anxious. The patient is not hyperactive.        Bedtime 10:30pm; wakes up 7:00am.      Past Medical History:  Diagnosis Date  . Allergy    previous allergy shots.    . Anxiety   . Arthritis    DDD lumbar spine, hands B  . Cataract   . Fecal incontinence   . GERD (gastroesophageal reflux disease)   . Hypertension   . 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  2012.  Marland Kitchen Rectal prolapse   . Rosacea conjunctivitis   . Substance abuse   . Tuberculosis 2012   Past Surgical History:  Procedure Laterality Date  . BREAST SURGERY     #1 age 21 yo  #2 age 32's  . EYE SURGERY Left 10/07/2014   Cataract removal- Dr. Elmer Picker  . EYE SURGERY Right 11/18/2014   Cataract removal- Dr. Elmer Picker  . FOOT SURGERY    . HAND SURGERY    . SHOULDER SURGERY    . TONSILLECTOMY AND ADENOIDECTOMY     childhood  . TUBAL LIGATION     Allergies  Allergen Reactions  . Penicillins Other (See Comments)    infant  . Pneumococcal Vaccines   . Sulfamethoxazole-Trimethoprim   . Sulfonamide Derivatives     Social History   Social History  . Marital status: Widowed    Spouse name: N/A  . Number of children: 0  . Years of education: N/A   Occupational History  . retired    Social History Main Topics  . Smoking status: Former Smoker    Packs/day: 1.50    Years: 25.00    Types: Cigarettes    Quit date: 12/20/1983  . Smokeless tobacco: Never Used  . Alcohol use No     Comment: former heavy use/alcoholism; quit in 1980 with treatment  . Drug use: No     Comment: former abuse of prescription drugs during 1970s.    . Sexual activity: No   Other Topics Concern  . Not on file   Social History Narrative   Marital status:  Widowed since 2007.  Widowed twice.  Not dating but interested.      Children: none      Lives: alone in house.  2 cats.      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      Drugs: none now; history of prescription drug abuse (Valium, anything I could get) in 1970s.      Exercise: cardio,weights, and abs 4-5 x a week for 1 hour      ADLs: independent with ADLs; drives; no assistant devices.      Advanced Directives; yes; stepdaughter/Debbie is HCPOA; DNI.   Family History  Problem Relation Age of Onset  . Stroke Mother 92       multiple CVAs  . Hypertension Mother   . Hyperlipidemia Mother   . Osteoporosis Mother   . Mental illness Mother        paranoid schizophrenia  . Alzheimer's disease Father   . Diabetes Father   . Hypertension Father   .  Cancer Father        prostate  . Osteoporosis Sister   . Kidney disease Sister   . Stroke Sister   . Hyperlipidemia Sister   . Hypertension Sister   . Mental illness Sister        depression; multiple behavior health admissions  . Cancer Sister        leukemia  . Osteoporosis Maternal Grandmother   . Stroke Maternal Grandmother   . Osteoporosis Paternal Grandmother   . Alzheimer's disease Paternal Grandfather   . Cancer Cousin        MATERNAL IST COUSIN - BREAST CA       Objective:    BP 114/71 (BP Location: Right Arm, Patient Position: Sitting, Cuff Size: Small)   Pulse 96   Temp 97.7 F (36.5 C) (Oral)   Resp 16   Ht 5\' 1"  (1.549 m)   Wt 132 lb 9.6 oz (60.1 kg)   SpO2 95%   BMI 25.05 kg/m  Physical Exam  Constitutional: She is oriented to person, place, and time. She appears well-developed and well-nourished. No distress.  HENT:  Head: Normocephalic and atraumatic.  Right Ear: External ear normal.  Left Ear:  External ear normal.  Nose: Nose normal.  Mouth/Throat: Oropharynx is clear and moist.  Eyes: Conjunctivae and EOM are normal. Pupils are equal, round, and reactive to light.  Neck: Normal range of motion and full passive range of motion without pain. Neck supple. No JVD present. Carotid bruit is not present. No thyromegaly present.  Cardiovascular: Normal rate, regular rhythm and normal heart sounds.  Exam reveals no gallop and no friction rub.   No murmur heard. Pulmonary/Chest: Effort normal and breath sounds normal. She has no wheezes. She has no rales. Right breast exhibits no inverted nipple, no mass, no nipple discharge, no skin change and no tenderness. Left breast exhibits no inverted nipple, no mass, no nipple discharge, no skin change and no tenderness. Breasts are symmetrical.  Abdominal: Soft. Bowel sounds are normal. She exhibits no distension and no mass. There is no tenderness. There is no rebound and no guarding.  Genitourinary: Vagina normal. There is no rash, tenderness, lesion or injury on the right labia. There is no rash, tenderness, lesion or injury on the left labia. Cervix exhibits no motion tenderness and no friability. Right adnexum displays no mass, no tenderness and no fullness. Left adnexum displays no mass, no tenderness and no fullness.  Musculoskeletal:       Right shoulder: Normal.       Left shoulder: Normal.       Cervical back: Normal.  Lymphadenopathy:    She has no cervical adenopathy.  Neurological: She is alert and oriented to person, place, and time. She has normal reflexes. No cranial nerve deficit. She exhibits normal muscle tone. Coordination normal.  Skin: Skin is warm and dry. No rash noted. She is not diaphoretic. No erythema. No pallor.  Psychiatric: She has a normal mood and affect. Her behavior is normal. Judgment and thought content normal.  Nursing note and vitals reviewed.   Depression screen [facility] 2/9 04/12/2017 02/01/2017 11/08/2016 09/21/2016  03/22/2016  Decreased Interest 0 0 0 0 0  Down, Depressed, Hopeless 0 0 0 0 0  PHQ - 2 Score 0 0 0 0 0   Fall Risk  04/12/2017 02/01/2017 11/08/2016 09/21/2016 03/22/2016  Falls in the past year? No No No No No  Number falls in past yr: - - - - -  Injury with Fall? - - - - -   Functional Status Survey: Is the patient deaf or have difficulty hearing?: Yes Does the patient have difficulty seeing, even when wearing glasses/contacts?: No Does the patient have difficulty concentrating, remembering, or making decisions?: No Does the patient have difficulty walking or climbing stairs?: No Does the patient have difficulty dressing or bathing?: No Does the patient have difficulty doing errands alone such as visiting a doctor's office or shopping?: No      Assessment & Plan:   1. Encounter for Medicare annual wellness exam   2. Essential hypertension   3. Gastroesophageal reflux disease without esophagitis   4. Rectal mucosa prolapse   5. Primary osteoarthritis of left knee   6. Hypercholesteremia   7. GAD (generalized anxiety disorder)   8. Psychophysiological insomnia   9. Alcoholism in recovery (HCC)   10. History of prescription drug abuse (HCC)   11. Encounter for screening mammogram for breast cancer   12. Estrogen deficiency   13. Colon cancer screening   14. Accessory oral frenulum    -anticipatory guidance --- exercise,\ weight loss, three servings of calcium daily, ASA 81mg  daily. -refer for mammogram and bone density scan. -obtain age appropriate screening labs and labs for chronic disease management. -pt agreeable to cologuard for colon cancer screening. -refer to maxillofacial surgeon due to accessory oral frenulum. -refills provided.  -rx for Shingrix provided.   Orders Placed This Encounter  Procedures  . DG Bone Density    HEALTH TEAM EPIC ORDER PF: 03/31/15 [facility]-PT REQ TO COME TO [facility] DUE TO INS   CR/PT  PT TAKES CAL SUPP     Standing Status:   Future    Standing  Expiration Date:   06/12/2018    Order Specific Question:   Reason for Exam (SYMPTOM  OR DIAGNOSIS REQUIRED)    Answer:   estrogen deficiency    Order Specific Question:   Preferred imaging location?    Answer:   [facility]  . CBC with Differential/Platelet  . Comprehensive metabolic panel    Order Specific Question:   Has the patient fasted?    Answer:   Yes  . Lipid panel    Order Specific Question:   Has the patient fasted?    Answer:   Yes  . Cologuard  . Ambulatory referral to Oral Maxillofacial Surgery    Referral Priority:   Routine    Referral Type:   Surgical    Referral Reason:   Specialty Services Required    Requested Specialty:   Oral Surgery    Number of Visits Requested:   1  . POCT urinalysis dipstick   Meds ordered this encounter  Medications  . omeprazole (PRILOSEC) 20 MG capsule    Sig: Take 1 capsule (20 mg total) by mouth daily.    Dispense:  90 capsule    Refill:  3  . rosuvastatin (CRESTOR) 10 MG tablet    Sig: Take 1 tablet (10 mg total) by mouth daily.    Dispense:  90 tablet    Refill:  3  . polyethylene glycol powder (GLYCOLAX/MIRALAX) powder    Sig: Take 17 g by mouth daily. Use as needed for constipation    Dispense:  850 g    Refill:  3  . meloxicam (MOBIC) 7.5 MG tablet    Sig: Take 1 tablet (7.5 mg total) by mouth daily.    Dispense:  90 tablet    Refill:  1  .  lisinopril-hydrochlorothiazide (PRINZIDE,ZESTORETIC) 20-25 MG tablet    Sig: Take 1 tablet by mouth daily.    Dispense:  90 tablet    Refill:  3  . traMADol (ULTRAM) 50 MG tablet    Sig: TAKE 1 TABLE BY MOUTH EVERY 12 HOURS AS NEEDED    Dispense:  60 tablet    Refill:  0  . hydrocortisone (PROCTOZONE-HC) 2.5 % rectal cream    Sig: Place 1 application rectally 2 (two) times daily.    Dispense:  30 g    Refill:  1  . Zoster Vac Recomb Adjuvanted [facility]) injection    Sig: Inject 0.5 mLs into the muscle once.    Dispense:  0.5 mL    Refill:  1    Return in about 6  months (around 10/12/2017) for recheck high blood pressure, high cholesterol, recheck.   Rayen Palen Paulita Fujita, M.D. Primary Care at [facility] previously [facility] [address] [place], [place]  [address] [phone] phone [phone] fax

## 2017-04-12 NOTE — Patient Instructions (Addendum)
   IF you received an x-ray today, you will receive an invoice from [facility]. Please contact [facility] at [phone] with questions or concerns regarding your invoice.   IF you received labwork today, you will receive an invoice from [facility]. Please contact [facility] at [phone] with questions or concerns regarding your invoice.   Our billing staff will not be able to assist you with questions regarding bills from these companies.  You will be contacted with the lab results as soon as they are available. The fastest way to get your results is to activate your My Chart account. Instructions are located on the last page of this paperwork. If you have not heard from us regarding the results in 2 weeks, please contact this office.      Preventive Care 65 Years and Older, Female Preventive care refers to lifestyle choices and visits with your health care provider that can promote health and wellness. What does preventive care include?  A yearly physical exam. This is also called an annual well check.  Dental exams once or twice a year.  Routine eye exams. Ask your health care provider how often you should have your eyes checked.  Personal lifestyle choices, including: ? Daily care of your teeth and gums. ? Regular physical activity. ? Eating a healthy diet. ? Avoiding tobacco and drug use. ? Limiting alcohol use. ? Practicing safe sex. ? Taking low-dose aspirin every day. ? Taking vitamin and mineral supplements as recommended by your health care provider. What happens during an annual well check? The services and screenings done by your health care provider during your annual well check will depend on your age, overall health, lifestyle risk factors, and family history of disease. Counseling Your health care provider may ask you questions about your:  Alcohol use.  Tobacco use.  Drug use.  Emotional well-being.  Home and relationship  well-being.  Sexual activity.  Eating habits.  History of falls.  Memory and ability to understand (cognition).  Work and work environment.  Reproductive health.  Screening You may have the following tests or measurements:  Height, weight, and BMI.  Blood pressure.  Lipid and cholesterol levels. These may be checked every 5 years, or more frequently if you are over 50 years old.  Skin check.  Lung cancer screening. You may have this screening every year starting at age 55 if you have a 30-pack-year history of smoking and currently smoke or have quit within the past 15 years.  Fecal occult blood test (FOBT) of the stool. You may have this test every year starting at age 50.  Flexible sigmoidoscopy or colonoscopy. You may have a sigmoidoscopy every 5 years or a colonoscopy every 10 years starting at age 50.  Hepatitis C blood test.  Hepatitis B blood test.  Sexually transmitted disease (STD) testing.  Diabetes screening. This is done by checking your blood sugar (glucose) after you have not eaten for a while (fasting). You may have this done every 1-3 years.  Bone density scan. This is done to screen for osteoporosis. You may have this done starting at age 65.  Mammogram. This may be done every 1-2 years. Talk to your health care provider about how often you should have regular mammograms.  Talk with your health care provider about your test results, treatment options, and if necessary, the need for more tests. Vaccines Your health care provider may recommend certain vaccines, such as:  Influenza vaccine. This is recommended every year.    Tetanus, diphtheria, and acellular pertussis (Tdap, Td) vaccine. You may need a Td booster every 10 years.  Varicella vaccine. You may need this if you have not been vaccinated.  Zoster vaccine. You may need this after age 60.  Measles, mumps, and rubella (MMR) vaccine. You may need at least one dose of MMR if you were born in  1959 or later. You may also need a second dose.  Pneumococcal 13-valent conjugate (PCV13) vaccine. One dose is recommended after age 65.  Pneumococcal polysaccharide (PPSV23) vaccine. One dose is recommended after age 65.  Meningococcal vaccine. You may need this if you have certain conditions.  Hepatitis A vaccine. You may need this if you have certain conditions or if you travel or work in places where you may be exposed to hepatitis A.  Hepatitis B vaccine. You may need this if you have certain conditions or if you travel or work in places where you may be exposed to hepatitis B.  Haemophilus influenzae type b (Hib) vaccine. You may need this if you have certain conditions.  Talk to your health care provider about which screenings and vaccines you need and how often you need them. This information is not intended to replace advice given to you by your health care provider. Make sure you discuss any questions you have with your health care provider. Document Released: 01/01/2016 Document Revised: 08/24/2016 Document Reviewed: 10/06/2015 Elsevier Interactive Patient Education  2017 [employer]vier Inc.  

## 2017-04-13 LAB — LIPID PANEL
CHOLESTEROL TOTAL: 211 mg/dL — AB (ref 100–199)
Chol/HDL Ratio: 2.2 ratio (ref 0.0–4.4)
HDL: 97 mg/dL (ref >39)
LDL Calculated: 102 mg/dL — ABNORMAL HIGH (ref 0–99)
Triglycerides: 60 mg/dL (ref 0–149)
VLDL Cholesterol Cal: 12 mg/dL (ref 5–40)

## 2017-04-13 LAB — CBC WITH DIFFERENTIAL/PLATELET
BASOS: 1 %
Basophils Absolute: 0 10*3/uL (ref 0.0–0.2)
EOS (ABSOLUTE): 0.1 10*3/uL (ref 0.0–0.4)
Eos: 2 %
Hematocrit: 42 % (ref 34.0–46.6)
Hemoglobin: 14.1 g/dL (ref 11.1–15.9)
IMMATURE GRANS (ABS): 0 10*3/uL (ref 0.0–0.1)
Immature Granulocytes: 0 %
LYMPHS ABS: 1.6 10*3/uL (ref 0.7–3.1)
LYMPHS: 32 %
MCH: 33.6 pg — AB (ref 26.6–33.0)
MCHC: 33.6 g/dL (ref 31.5–35.7)
MCV: 100 fL — AB (ref 79–97)
MONOS ABS: 0.5 10*3/uL (ref 0.1–0.9)
Monocytes: 10 %
NEUTROS ABS: 2.7 10*3/uL (ref 1.4–7.0)
Neutrophils: 55 %
Platelets: 317 10*3/uL (ref 150–379)
RBC: 4.2 x10E6/uL (ref 3.77–5.28)
RDW: 12.5 % (ref 12.3–15.4)
WBC: 4.9 10*3/uL (ref 3.4–10.8)

## 2017-04-13 LAB — COMPREHENSIVE METABOLIC PANEL
A/G RATIO: 1.9 (ref 1.2–2.2)
ALK PHOS: 48 IU/L (ref 39–117)
ALT: 18 IU/L (ref 0–32)
AST: 24 IU/L (ref 0–40)
Albumin: 4.3 g/dL (ref 3.5–4.8)
BILIRUBIN TOTAL: 0.3 mg/dL (ref 0.0–1.2)
BUN / CREAT RATIO: 21 (ref 12–28)
BUN: 19 mg/dL (ref 8–27)
CHLORIDE: 98 mmol/L (ref 96–106)
CO2: 28 mmol/L (ref 18–29)
Calcium: 9.6 mg/dL (ref 8.7–10.3)
Creatinine, Ser: 0.92 mg/dL (ref 0.57–1.00)
GFR calc Af Amer: 72 mL/min/{1.73_m2} (ref >59)
GFR calc non Af Amer: 62 mL/min/{1.73_m2} (ref >59)
Globulin, Total: 2.3 g/dL (ref 1.5–4.5)
Glucose: 92 mg/dL (ref 65–99)
POTASSIUM: 3.9 mmol/L (ref 3.5–5.2)
Sodium: 141 mmol/L (ref 134–144)
TOTAL PROTEIN: 6.6 g/dL (ref 6.0–8.5)

## 2017-04-19 ENCOUNTER — Other Ambulatory Visit: Payer: Self-pay | Admitting: Family Medicine

## 2017-04-20 NOTE — Telephone Encounter (Signed)
03/2016 last refill 03/2017 last ov

## 2017-04-24 ENCOUNTER — Other Ambulatory Visit: Payer: Self-pay | Admitting: Family Medicine

## 2017-04-24 DIAGNOSIS — Z1231 Encounter for screening mammogram for malignant neoplasm of breast: Secondary | ICD-10-CM

## 2017-05-03 ENCOUNTER — Encounter: Payer: Self-pay | Admitting: Gynecology

## 2017-05-10 ENCOUNTER — Ambulatory Visit
Admission: RE | Admit: 2017-05-10 | Discharge: 2017-05-10 | Disposition: A | Discharge status: Home / Self Care | Payer: PPO | Source: Ambulatory Visit | Attending: Family Medicine | Admitting: Family Medicine

## 2017-05-10 DIAGNOSIS — Z1231 Encounter for screening mammogram for malignant neoplasm of breast: Secondary | ICD-10-CM | POA: Diagnosis not present

## 2017-05-10 DIAGNOSIS — M8589 Other specified disorders of bone density and structure, multiple sites: Secondary | ICD-10-CM | POA: Diagnosis not present

## 2017-05-10 DIAGNOSIS — Z780 Asymptomatic menopausal state: Secondary | ICD-10-CM | POA: Diagnosis not present

## 2017-05-10 DIAGNOSIS — E2839 Other primary ovarian failure: Secondary | ICD-10-CM

## 2017-05-11 ENCOUNTER — Other Ambulatory Visit: Payer: Self-pay | Admitting: Family Medicine

## 2017-05-30 ENCOUNTER — Ambulatory Visit (INDEPENDENT_AMBULATORY_CARE_PROVIDER_SITE_OTHER): Payer: PPO | Admitting: Sports Medicine

## 2017-05-30 ENCOUNTER — Encounter: Payer: Self-pay | Admitting: Sports Medicine

## 2017-05-30 VITALS — BP 120/70 | Ht 61.0 in | Wt 132.0 lb

## 2017-05-30 DIAGNOSIS — M1712 Unilateral primary osteoarthritis, left knee: Secondary | ICD-10-CM

## 2017-05-30 MED ORDER — METHYLPREDNISOLONE ACETATE 40 MG/ML IJ SUSP
40.0000 mg | Freq: Once | INTRAMUSCULAR | Status: AC
  Administered 2017-05-30: 40 mg via INTRA_ARTICULAR

## 2017-05-30 NOTE — Progress Notes (Signed)
   Subjective:    Patient ID: Cheryl Kent, female    DOB: 1946-11-20, 71 y.o.   MRN: [id number]  HPI chief complaint: Left knee pain  Very pleasant 71 year old female comes in today complaining of returning left knee pain. She has a documented history of advanced lateral compartmental DJD. Previous x-rays showed bone-on-bone DJD in the lateral compartment on the 30 flexion view. She was given a cortisone injection back in November which helped tremendously until a few days ago. Her pain has now returned. She denies any recent trauma. She describes a feeling of instability and catching which occurs intermittently but quite frequently. She does wear a compression sleeve which seems to help. She also works out at [employer] trying to keep her legs strong. She has not noticed any swelling. She takes meloxicam which helps tremendously with some back pain but does very little to help her knee pain.  Interim medical history reviewed Medications reviewed Allergies reviewed    Review of Systems As above    Objective:   Physical Exam Well-developed, well-nourished. No acute distress  Left knee: Full range of motion. No obvious effusion. She is tender to palpation along the lateral joint line. No tenderness along the medial joint line. Negative McMurray's. Negative Thessaly's. No fullness in the popliteal fossa. Knee is grossly stable to ligamentous exam. Neurovascularly intact distally. Walking with a slight limp.  X-rays of the left knee are as above       Assessment & Plan:   Returning left knee pain secondary to advanced lateral compartmental DJD  Patient's left knee was reinjected today with cortisone. An anterior lateral approach was utilized. She tolerates this without difficulty. If her symptoms persist despite today's repeat injection then I will refer her to Dr. Rhona Raider for possible Visco supplementation. Definitive treatment is a total knee arthroplasty but she is not yet ready to  pursue this. I have encouraged her to continue with daily quad and hamstring exercises as well as using her compression sleeve when active. She will follow-up for ongoing or recalcitrant issues.  Consent obtained and verified. Time-out conducted. Noted no overlying erythema, induration, or other signs of local infection. Skin prepped in a sterile fashion. Topical analgesic spray: Ethyl chloride. Joint: left knee Needle: 22g 1.5 inch Completed without difficulty. Meds: 3cc 1% xylocaine, 1cc (40mg ) depomedrol  Advised to call if fevers/chills, erythema, induration, drainage, or persistent bleeding.

## 2017-07-21 DIAGNOSIS — M2342 Loose body in knee, left knee: Secondary | ICD-10-CM | POA: Diagnosis not present

## 2017-07-21 DIAGNOSIS — M1712 Unilateral primary osteoarthritis, left knee: Secondary | ICD-10-CM | POA: Diagnosis not present

## 2017-07-25 DIAGNOSIS — M1712 Unilateral primary osteoarthritis, left knee: Secondary | ICD-10-CM | POA: Diagnosis not present

## 2017-09-21 DIAGNOSIS — Z961 Presence of intraocular lens: Secondary | ICD-10-CM | POA: Diagnosis not present

## 2017-09-21 DIAGNOSIS — H35363 Drusen (degenerative) of macula, bilateral: Secondary | ICD-10-CM | POA: Diagnosis not present

## 2017-09-21 DIAGNOSIS — H40013 Open angle with borderline findings, low risk, bilateral: Secondary | ICD-10-CM | POA: Diagnosis not present

## 2017-09-21 DIAGNOSIS — H04123 Dry eye syndrome of bilateral lacrimal glands: Secondary | ICD-10-CM | POA: Diagnosis not present

## 2017-09-25 ENCOUNTER — Encounter: Payer: Self-pay | Admitting: Family Medicine

## 2017-09-25 ENCOUNTER — Ambulatory Visit (INDEPENDENT_AMBULATORY_CARE_PROVIDER_SITE_OTHER): Payer: PPO | Admitting: Family Medicine

## 2017-09-25 VITALS — BP 120/70 | HR 80 | Temp 98.0°F | Resp 16 | Ht 60.63 in | Wt 132.0 lb

## 2017-09-25 DIAGNOSIS — E7800 Pure hypercholesterolemia, unspecified: Secondary | ICD-10-CM | POA: Diagnosis not present

## 2017-09-25 DIAGNOSIS — F5104 Psychophysiologic insomnia: Secondary | ICD-10-CM

## 2017-09-25 DIAGNOSIS — Z23 Encounter for immunization: Secondary | ICD-10-CM

## 2017-09-25 DIAGNOSIS — K219 Gastro-esophageal reflux disease without esophagitis: Secondary | ICD-10-CM | POA: Diagnosis not present

## 2017-09-25 DIAGNOSIS — I10 Essential (primary) hypertension: Secondary | ICD-10-CM | POA: Diagnosis not present

## 2017-09-25 DIAGNOSIS — F411 Generalized anxiety disorder: Secondary | ICD-10-CM | POA: Diagnosis not present

## 2017-09-25 DIAGNOSIS — M85852 Other specified disorders of bone density and structure, left thigh: Secondary | ICD-10-CM | POA: Diagnosis not present

## 2017-09-25 DIAGNOSIS — R002 Palpitations: Secondary | ICD-10-CM

## 2017-09-25 MED ORDER — ALENDRONATE SODIUM 70 MG PO TABS
70.0000 mg | ORAL_TABLET | ORAL | 3 refills | Status: DC
Start: 2017-09-25 — End: 2018-04-25

## 2017-09-25 NOTE — Patient Instructions (Addendum)
   IF you received an x-ray today, you will receive an invoice from [facility]. Please contact [facility] at [phone] with questions or concerns regarding your invoice.   IF you received labwork today, you will receive an invoice from [facility]. Please contact [facility] at [phone] with questions or concerns regarding your invoice.   Our billing staff will not be able to assist you with questions regarding bills from these companies.  You will be contacted with the lab results as soon as they are available. The fastest way to get your results is to activate your My Chart account. Instructions are located on the last page of this paperwork. If you have not heard from us regarding the results in 2 weeks, please contact this office.      Osteoporosis Osteoporosis is the thinning and loss of density in the bones. Osteoporosis makes the bones more brittle, fragile, and likely to break (fracture). Over time, osteoporosis can cause the bones to become so weak that they fracture after a simple fall. The bones most likely to fracture are the bones in the hip, wrist, and spine. What are the causes? The exact cause is not known. What increases the risk? Anyone can develop osteoporosis. You may be at greater risk if you have a family history of the condition or have poor nutrition. You may also have a higher risk if you are:  Female.  50 years old or older.  A smoker.  Not physically active.  White or Asian.  Slender. What are the signs or symptoms? A fracture might be the first sign of the disease, especially if it results from a fall or injury that would not usually cause a bone to break. Other signs and symptoms include:  Low back and neck pain.  Stooped posture.  Height loss. How is this diagnosed? To make a diagnosis, your health care provider may:  Take a medical history.  Perform a physical exam.  Order tests, such as:  A bone mineral density  test.  A dual-energy X-ray absorptiometry test. How is this treated? The goal of osteoporosis treatment is to strengthen your bones to reduce your risk of a fracture. Treatment may involve:  Making lifestyle changes, such as:  Eating a diet rich in calcium.  Doing weight-bearing and muscle-strengthening exercises.  Stopping tobacco use.  Limiting alcohol intake.  Taking medicine to slow the process of bone loss or to increase bone density.  Monitoring your levels of calcium and vitamin D. Follow these instructions at home:  Include calcium and vitamin D in your diet. Calcium is important for bone health, and vitamin D helps the body absorb calcium.  Perform weight-bearing and muscle-strengthening exercises as directed by your health care provider.  Do not use any tobacco products, including cigarettes, chewing tobacco, and electronic cigarettes. If you need help quitting, ask your health care provider.  Limit your alcohol intake.  Take medicines only as directed by your health care provider.  Keep all follow-up visits as directed by your health care provider. This is important.  Take precautions at home to lower your risk of falling, such as:  Keeping rooms well lit and clutter free.  Installing safety rails on stairs.  Using rubber mats in the bathroom and other areas that are often wet or slippery. Get help right away if: You fall or injure yourself. This information is not intended to replace advice given to you by your health care provider. Make sure you discuss any questions you   have with your health care provider. Document Released: 09/14/2005 Document Revised: 05/09/2016 Document Reviewed: 05/15/2014 Elsevier Interactive Patient Education  2017 Elsevier Inc.  

## 2017-09-25 NOTE — Progress Notes (Signed)
Subjective:    Patient ID: Cheryl Kent, female    DOB: 1946-12-03, 71 y.o.   MRN: [id number]  09/25/2017  Hypertension (6 month follow-up); Gastroesophageal Reflux; and Anxiety    HPI This 71 y.o. female presents for six month follow-up of hypertension, GERD, anxiety.  No changes to management made at last visit other than ordering mammogram and bone density scan and referring to maxillofacial surgeon due to accessory oral frenulum and ordering Cologuard.  Patient never received cologuard in the mail.    Osteoarthritis knees: has received Synvisc injection and also has received steroid injections in knees. Wants to avoid surgery if possible.  GERD: taking Omeprazole daily since last visit; helps a lot; does not totally take it away but great improvement.  OSTEOPENIA:  Detected on bone density scan.  FRAX risk of hip fracture in ten years of 6%.  three servings of dairy daily colltage cheese, yogurt, soy milk, ice cream.  Takes calcium supplement 500mg  + 600mg .   Vitamin D in MVI.  Calcium with Vitamin D.  Went to gym last week five days last week.  Weight bearing elliptical and treadmill.   Has taken previous Fosamax for two years.  Stopped it on own.   Early menopause at age 64.    HTN: Patient reports good compliance with medication, good tolerance to medication, and good symptom control.    Hypercholesterolemia: Patient reports good compliance with medication, good tolerance to medication, and good symptom control.      BP Readings from Last 3 Encounters:  09/25/17 120/70  05/30/17 120/70  04/12/17 114/71   Wt Readings from Last 3 Encounters:  09/25/17 132 lb (59.9 kg)  05/30/17 132 lb (59.9 kg)  04/12/17 132 lb 9.6 oz (60.1 kg)   Immunization History  Administered Date(s) Administered  . Influenza,inj,Quad PF,6+ Mos 09/21/2016, 09/25/2017  . Influenza-Unspecified 08/19/2013, 09/23/2014, 09/24/2015  . Pneumococcal Conjugate-13 09/07/2015  . Pneumococcal Polysaccharide-23  12/19/2006  . Td 07/20/2011  . Tdap 12/20/2004  . Zoster 12/24/2006  . Zoster Recombinat (Shingrix) 04/12/2017    Review of Systems  Constitutional: Negative for chills, diaphoresis, fatigue and fever.  Eyes: Negative for visual disturbance.  Respiratory: Negative for cough and shortness of breath.   Cardiovascular: Positive for palpitations. Negative for chest pain and leg swelling.  Gastrointestinal: Negative for abdominal pain, constipation, diarrhea, nausea and vomiting.  Endocrine: Negative for cold intolerance, heat intolerance, polydipsia, polyphagia and polyuria.  Musculoskeletal: Positive for arthralgias.  Neurological: Negative for dizziness, tremors, seizures, syncope, facial asymmetry, speech difficulty, weakness, light-headedness, numbness and headaches.    Past Medical History:  Diagnosis Date  . Allergy    previous allergy shots.    . Anxiety   . Arthritis    DDD lumbar spine, hands B  . Cataract   . Fecal incontinence   . GERD (gastroesophageal reflux disease)   . Hypertension   . 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  2012.  Marland Kitchen Rectal prolapse   . Rosacea conjunctivitis   . Substance abuse ([place])   . Tuberculosis 2012   Past Surgical History:  Procedure Laterality Date  . BREAST EXCISIONAL BIOPSY Right    benign x 2  . BREAST EXCISIONAL BIOPSY Left    benign  . BREAST SURGERY     #1 age 73 yo  #2 age 2's  . EYE SURGERY Left 10/07/2014   Cataract removal- Dr. Herbert Deaner  . EYE SURGERY Right 11/18/2014   Cataract removal- Dr.  Hecker  . FOOT SURGERY    . HAND SURGERY    . SHOULDER SURGERY    . TONSILLECTOMY AND ADENOIDECTOMY     childhood  . TUBAL LIGATION     Allergies  Allergen Reactions  . Penicillins Other (See Comments)    infant  . Pneumococcal Vaccines   . Sulfamethoxazole-Trimethoprim   . Sulfonamide Derivatives    Current Outpatient Prescriptions on File Prior to Visit  Medication Sig Dispense  Refill  . aspirin 81 MG tablet Take 81 mg by mouth daily.      . Calcium Carbonate-Vitamin D (CALCIUM + D PO) Take by mouth daily.    . Cholecalciferol (VITAMIN D) 2000 UNITS CAPS Take by mouth daily.    . Coenzyme Q10 (COQ10) 100 MG CAPS Take by mouth daily.    Marland Kitchen doxylamine, Sleep, (UNISOM) 25 MG tablet Take 25 mg by mouth at bedtime as needed.    . Lactobacillus (DIGESTIVE HEALTH PROBIOTIC PO) Take by mouth. Digestive Advantage 1 gummie chewed daily.    Marland Kitchen lisinopril-hydrochlorothiazide (PRINZIDE,ZESTORETIC) 20-25 MG tablet TAKE 1 TABLET BY MOUTH DAILY. 90 tablet 3  . Magnesium 300 MG CAPS Take by mouth daily.    . meloxicam (MOBIC) 7.5 MG tablet Take 1 tablet (7.5 mg total) by mouth daily. 90 tablet 1  . Multiple Vitamin (MULTIVITAMIN) tablet Take 1 tablet by mouth daily.    Marland Kitchen omeprazole (PRILOSEC) 20 MG capsule Take 1 capsule (20 mg total) by mouth daily. 90 capsule 3  . polyethylene glycol powder (GLYCOLAX/MIRALAX) powder Take 17 g by mouth daily. Use as needed for constipation 850 g 3  . ranitidine (ZANTAC) 150 MG tablet Take 75 mg by mouth at bedtime. Reported on 03/22/2016    . rosuvastatin (CRESTOR) 10 MG tablet Take 1 tablet (10 mg total) by mouth daily. 90 tablet 3  . traMADol (ULTRAM) 50 MG tablet TAKE 1 TABLE BY MOUTH EVERY 12 HOURS AS NEEDED 60 tablet 0  . traZODone (DESYREL) 150 MG tablet TAKE 1 TABLET (150 MG TOTAL) BY MOUTH AT BEDTIME. 90 tablet 3   No current facility-administered medications on file prior to visit.    Social History   Social History  . Marital status: Widowed    Spouse name: N/A  . Number of children: 0  . Years of education: N/A   Occupational History  . retired    Social History Main Topics  . Smoking status: Former Smoker    Packs/day: 1.50    Years: 25.00    Types: Cigarettes    Quit date: 12/20/1983  . Smokeless tobacco: Never Used  . Alcohol use No     Comment: former heavy use/alcoholism; quit in 1980 with treatment  . Drug use: No      Comment: former abuse of prescription drugs during 1970s.  . Sexual activity: No   Other Topics Concern  . Not on file   Social History Narrative   Marital status:  Widowed since 2007.  Widowed twice.  Not dating but interested.      Children: none      Lives: alone in house.  2 cats.      Employment:  Retired; at age 71; pet sits alot      Tobacco: quit 30 years ago      Alcohol: quit in 1980; in recovery since 1980s.      Drugs: none now; history of prescription drug abuse (Valium, anything I could get) in 1970s.      Exercise:  cardio,weights, and abs 4-5 x a week for 1 hour      ADLs: independent with ADLs; drives; no assistant devices.      Advanced Directives; yes; stepdaughter/Debbie is HCPOA; DNI.   Family History  Problem Relation Age of Onset  . Stroke Mother 58       multiple CVAs  . Hypertension Mother   . Hyperlipidemia Mother   . Osteoporosis Mother   . Mental illness Mother        paranoid schizophrenia  . Alzheimer's disease Father   . Diabetes Father   . Hypertension Father   . Cancer Father        prostate  . Osteoporosis Sister   . Kidney disease Sister   . Stroke Sister   . Hyperlipidemia Sister   . Hypertension Sister   . Mental illness Sister        depression; multiple behavior health admissions  . Cancer Sister        leukemia  . Osteoporosis Maternal Grandmother   . Stroke Maternal Grandmother   . Osteoporosis Paternal Grandmother   . Alzheimer's disease Paternal Grandfather   . Cancer Cousin        MATERNAL IST COUSIN - BREAST CA       Objective:    BP 120/70   Pulse 80   Temp 98 F (36.7 C) (Oral)   Resp 16   Ht 5' 0.63" (1.54 m)   Wt 132 lb (59.9 kg)   SpO2 95%   BMI 25.25 kg/m  Physical Exam  Constitutional: She is oriented to person, place, and time. She appears well-developed and well-nourished. No distress.  HENT:  Head: Normocephalic and atraumatic.  Right Ear: External ear normal.  Left Ear: External ear normal.    Nose: Nose normal.  Mouth/Throat: Oropharynx is clear and moist.  Eyes: Pupils are equal, round, and reactive to light. Conjunctivae and EOM are normal.  Neck: Normal range of motion. Neck supple. Carotid bruit is not present. No thyromegaly present.  Cardiovascular: Normal rate, regular rhythm, normal heart sounds and intact distal pulses.  Exam reveals no gallop and no friction rub.   No murmur heard. Pulmonary/Chest: Effort normal and breath sounds normal. She has no wheezes. She has no rales.  Abdominal: Soft. Bowel sounds are normal. She exhibits no distension and no mass. There is no tenderness. There is no rebound and no guarding.  Lymphadenopathy:    She has no cervical adenopathy.  Neurological: She is alert and oriented to person, place, and time. No cranial nerve deficit.  Skin: Skin is warm and dry. No rash noted. She is not diaphoretic. No erythema. No pallor.  Psychiatric: She has a normal mood and affect. Her behavior is normal.   No results found. Depression screen [facility] 2/9 09/25/2017 04/12/2017 02/01/2017 11/08/2016 09/21/2016  Decreased Interest 0 0 0 0 0  Down, Depressed, Hopeless 0 0 0 0 0  PHQ - 2 Score 0 0 0 0 0   Fall Risk  09/25/2017 04/12/2017 02/01/2017 11/08/2016 09/21/2016  Falls in the past year? No No No No No  Number falls in past yr: - - - - -  Injury with Fall? - - - - -        Assessment & Plan:   1. Essential hypertension   2. Gastroesophageal reflux disease without esophagitis   3. Hypercholesteremia   4. Psychophysiological insomnia   5. GAD (generalized anxiety disorder)   6. Osteopenia of left hip  7. Need for prophylactic vaccination and inoculation against influenza   8. Palpitations    -controlled blood pressure and cholesterol; obtain labs for chronic disease management; no change in management. -new onset osteopenia with FRAX score of 6% for hip fracture in upcoming ten years; current guidelines recommend treating with bisphosophonate; rx  for Fosamax provided; Current guidelines recommend the following additional treatment: 1.  Calcium 600mg  twice daily OR three servings of dairy daily.  2.  Vitamin D [phone] IU daily.  3.  Daily weight bearing exercise for 30 minutes.  4.  Repeat bone density scan in 2 years. -new onset palpitations; refer to cardiology for event monitor to rule out arrhythmia. -suffering with ongoing knee pain; s/p Synvisc injection recently.      Orders Placed This Encounter  Procedures  . Flu Vaccine QUAD 36+ mos IM  . CBC with Differential/Platelet  . Comprehensive metabolic panel    Order Specific Question:   Has the patient fasted?    Answer:   No  . Lipid panel    Order Specific Question:   Has the patient fasted?    Answer:   No  . VITAMIN D 25 Hydroxy (Vit-D Deficiency, Fractures)  . Ambulatory referral to Cardiology    Referral Priority:   Routine    Referral Type:   Consultation    Referral Reason:   Specialty Services Required    Requested Specialty:   Cardiology    Number of Visits Requested:   1  . EKG 12-Lead   Meds ordered this encounter  Medications  . alendronate (FOSAMAX) 70 MG tablet    Sig: Take 1 tablet (70 mg total) by mouth every 7 (seven) days. Take with a full glass of water on an empty stomach.    Dispense:  12 tablet    Refill:  3    Return in about 6 months (around 03/26/2018) for complete physical examiniation.   Kristi Elayne Guerin, M.D. Primary Care at [facility] [address] [place], [place]  [address] [phone] phone [phone] fax

## 2017-09-26 LAB — COMPREHENSIVE METABOLIC PANEL
ALT: 23 IU/L (ref 0–32)
AST: 28 IU/L (ref 0–40)
Albumin/Globulin Ratio: 2 (ref 1.2–2.2)
Albumin: 4.3 g/dL (ref 3.5–4.8)
Alkaline Phosphatase: 55 IU/L (ref 39–117)
BUN/Creatinine Ratio: 17 (ref 12–28)
BUN: 15 mg/dL (ref 8–27)
Bilirubin Total: 0.7 mg/dL (ref 0.0–1.2)
CALCIUM: 9.5 mg/dL (ref 8.7–10.3)
CO2: 23 mmol/L (ref 20–29)
CREATININE: 0.89 mg/dL (ref 0.57–1.00)
Chloride: 100 mmol/L (ref 96–106)
GFR, EST AFRICAN AMERICAN: 74 mL/min/{1.73_m2} (ref >59)
GFR, EST NON AFRICAN AMERICAN: 65 mL/min/{1.73_m2} (ref >59)
GLUCOSE: 89 mg/dL (ref 65–99)
Globulin, Total: 2.1 g/dL (ref 1.5–4.5)
POTASSIUM: 4 mmol/L (ref 3.5–5.2)
Sodium: 141 mmol/L (ref 134–144)
TOTAL PROTEIN: 6.4 g/dL (ref 6.0–8.5)

## 2017-09-26 LAB — CBC WITH DIFFERENTIAL/PLATELET
BASOS: 0 %
Basophils Absolute: 0 10*3/uL (ref 0.0–0.2)
EOS (ABSOLUTE): 0.1 10*3/uL (ref 0.0–0.4)
Eos: 2 %
Hematocrit: 38.9 % (ref 34.0–46.6)
Hemoglobin: 13.6 g/dL (ref 11.1–15.9)
IMMATURE GRANS (ABS): 0 10*3/uL (ref 0.0–0.1)
IMMATURE GRANULOCYTES: 0 %
LYMPHS: 27 %
Lymphocytes Absolute: 2.1 10*3/uL (ref 0.7–3.1)
MCH: 34.3 pg — ABNORMAL HIGH (ref 26.6–33.0)
MCHC: 35 g/dL (ref 31.5–35.7)
MCV: 98 fL — AB (ref 79–97)
MONOS ABS: 0.6 10*3/uL (ref 0.1–0.9)
Monocytes: 7 %
Neutrophils Absolute: 5.1 10*3/uL (ref 1.4–7.0)
Neutrophils: 64 %
PLATELETS: 300 10*3/uL (ref 150–379)
RBC: 3.96 x10E6/uL (ref 3.77–5.28)
RDW: 12.8 % (ref 12.3–15.4)
WBC: 7.9 10*3/uL (ref 3.4–10.8)

## 2017-09-26 LAB — LIPID PANEL
CHOL/HDL RATIO: 2.3 ratio (ref 0.0–4.4)
Cholesterol, Total: 208 mg/dL — ABNORMAL HIGH (ref 100–199)
HDL: 92 mg/dL (ref >39)
LDL Calculated: 102 mg/dL — ABNORMAL HIGH (ref 0–99)
TRIGLYCERIDES: 68 mg/dL (ref 0–149)
VLDL CHOLESTEROL CAL: 14 mg/dL (ref 5–40)

## 2017-09-26 LAB — VITAMIN D 25 HYDROXY (VIT D DEFICIENCY, FRACTURES): VIT D 25 HYDROXY: 45.7 ng/mL (ref 30.0–100.0)

## 2017-09-29 ENCOUNTER — Encounter: Payer: Self-pay | Admitting: Family Medicine

## 2017-09-29 NOTE — Progress Notes (Signed)
Letter sent.

## 2017-10-02 NOTE — Progress Notes (Signed)
Cardiology Office Note   Date:  10/04/2017   ID:  SOPHEE MCKIMMY, DOB 10/05/1946, MRN [id number]  PCP:  Wardell Honour, MD  Cardiologist:   Minus Breeding, MD  Referring:  Wardell Honour, MD  Chief Complaint  Patient presents with  . Palpitations      History of Present Illness: Cheryl Kent is a 71 y.o. female who is referred by Wardell Honour, MD for evaluation of palpitations.  The patient has no past cardiac history.  She is quite active. She exercises 4-5 times per week. She does the elliptical and treadmill. She denies any cardiovascular symptoms with this. She denies any chest pressure, neck or arm discomfort. She doesn't have any shortness of breath, PND or orthopnea. She does seem to have a "quivery"  feeling sometimes in her chest when she is at rest.   She's not describing palpitations, presyncope or syncope. She's had no weight gain or edema.   Past Medical History:  Diagnosis Date  . Allergy    previous allergy shots.    . Anxiety   . Arthritis    DDD lumbar spine, hands B  . Cataract   . Fecal incontinence   . GERD (gastroesophageal reflux disease)   . Hyperlipemia   . Hypertension   . 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  2012.  Marland Kitchen Rectal prolapse   . Rosacea conjunctivitis   . Substance abuse ([place])   . Tuberculosis 2012    Past Surgical History:  Procedure Laterality Date  . BREAST EXCISIONAL BIOPSY Right    benign x 2  . BREAST EXCISIONAL BIOPSY Left    benign  . BREAST SURGERY     #1 age 40 yo  #2 age 25's  . EYE SURGERY Left 10/07/2014   Cataract removal- Dr. Herbert Deaner  . EYE SURGERY Right 11/18/2014   Cataract removal- Dr. Herbert Deaner  . FOOT SURGERY    . HAND SURGERY    . SHOULDER SURGERY    . TONSILLECTOMY AND ADENOIDECTOMY     childhood  . TUBAL LIGATION       Current Outpatient Prescriptions  Medication Sig Dispense Refill  . Cholecalciferol (VITAMIN D) 2000 UNITS CAPS Take by mouth daily.     . Coenzyme Q10 (COQ10) 100 MG CAPS Take by mouth daily.    Marland Kitchen doxylamine, Sleep, (UNISOM) 25 MG tablet Take 25 mg by mouth at bedtime as needed.    . Lactobacillus (DIGESTIVE HEALTH PROBIOTIC PO) Take by mouth. Digestive Advantage 1 gummie chewed daily.    Marland Kitchen lisinopril-hydrochlorothiazide (PRINZIDE,ZESTORETIC) 20-25 MG tablet TAKE 1 TABLET BY MOUTH DAILY. 90 tablet 3  . Magnesium 300 MG CAPS Take by mouth daily.    . Multiple Vitamin (MULTIVITAMIN) tablet Take 1 tablet by mouth daily.    Marland Kitchen omeprazole (PRILOSEC) 20 MG capsule Take 1 capsule (20 mg total) by mouth daily. 90 capsule 3  . polyethylene glycol powder (GLYCOLAX/MIRALAX) powder Take 17 g by mouth daily. Use as needed for constipation 850 g 3  . rosuvastatin (CRESTOR) 10 MG tablet Take 1 tablet (10 mg total) by mouth daily. 90 tablet 3  . traMADol (ULTRAM) 50 MG tablet TAKE 1 TABLE BY MOUTH EVERY 12 HOURS AS NEEDED 60 tablet 0  . traZODone (DESYREL) 150 MG tablet TAKE 1 TABLET (150 MG TOTAL) BY MOUTH AT BEDTIME. 90 tablet 3  . alendronate (FOSAMAX) 70 MG tablet Take 1 tablet (70 mg total) by mouth every  7 (seven) days. Take with a full glass of water on an empty stomach. 12 tablet 3  . aspirin 81 MG tablet Take 81 mg by mouth daily.      . Calcium Carbonate-Vitamin D (CALCIUM + D PO) Take by mouth daily.     No current facility-administered medications for this visit.     Allergies:   Penicillins; Pneumococcal vaccines; Sulfamethoxazole-trimethoprim; and Sulfonamide derivatives    Social History:  The patient  reports that she quit smoking about 33 years ago. Her smoking use included Cigarettes. She has a 37.50 pack-year smoking history. She has never used smokeless tobacco. She reports that she does not drink alcohol or use drugs.   Family History:  The patient's family history includes Alzheimer's disease in her father and paternal grandfather; Cancer in her cousin, father, and sister; Diabetes in her father; Hyperlipidemia in her  mother and sister; Hypertension in her father, mother, and sister; Kidney disease in her sister; Mental illness in her mother and sister; Osteoporosis in her maternal grandmother, mother, paternal grandmother, and sister; Stroke in her maternal grandmother and sister; Stroke (age of onset: 25) in her mother.    ROS:  Please see the history of present illness.   Otherwise, review of systems are positive for none.   All other systems are reviewed and negative.    PHYSICAL EXAM: VS:  BP 132/86   Pulse 79   Ht 5\' 1"  (1.549 m)   Wt 135 lb (61.2 kg)   BMI 25.51 kg/m  , BMI Body mass index is 25.51 kg/m. GENERAL:  Well appearing HEENT:  Pupils equal round and reactive, fundi not visualized, oral mucosa unremarkable NECK:  No jugular venous distention, waveform within normal limits, carotid upstroke brisk and symmetric, no bruits, no thyromegaly LYMPHATICS:  No cervical, inguinal adenopathy LUNGS:  Clear to auscultation bilaterally BACK:  No CVA tenderness CHEST:  Unremarkable HEART:  PMI not displaced or sustained,S1 and S2 within normal limits, no S3, no S4, no clicks, no rubs, no murmurs ABD:  Flat, positive bowel sounds normal in frequency in pitch, no bruits, no rebound, no guarding, no midline pulsatile mass, no hepatomegaly, no splenomegaly EXT:  2 plus pulses throughout, no edema, no cyanosis no clubbing SKIN:  No rashes no nodules NEURO:  Cranial nerves II through XII grossly intact, motor grossly intact throughout PSYCH:  Cognitively intact, oriented to person place and time    EKG:  EKG is ordered today. The ekg ordered today demonstrates sinus rhythm, rate 79, axis within normal limits, intervals within normal limits, no acute ST-T wave changes.   Recent Labs: 09/25/2017: ALT 23; BUN 15; Creatinine, Ser 0.89; Hemoglobin 13.6; Platelets 300; Potassium 4.0; Sodium 141    Lipid Panel    Component Value Date/Time   CHOL 208 (H) 09/25/2017 1154   TRIG 68 09/25/2017 1154   HDL  92 09/25/2017 1154   CHOLHDL 2.3 09/25/2017 1154   CHOLHDL 2.0 09/21/2016 1040   VLDL 13 09/21/2016 1040   LDLCALC 102 (H) 09/25/2017 1154      Wt Readings from Last 3 Encounters:  10/03/17 135 lb (61.2 kg)  09/25/17 132 lb (59.9 kg)  05/30/17 132 lb (59.9 kg)      Other studies Reviewed: Additional studies/ records that were reviewed today include: Outside labs and office records. Review of the above records demonstrates:  Please see elsewhere in the note.     ASSESSMENT AND PLAN:  PALPITATIONS:  After careful questioning the patient is  not really describing any palpitations. She doesn't have any chest pain. She has a normal EKG and physical. She has no significant cardiovascular risk factors. She has a very high functional level. Given this no further cardiovascular testing is suggested as the pretest probability of obstructive coronary disease is very low.  DYSLIPIDEMIA: Lipid profile is good with a very high HDL. No change in therapy is indicated.  HTN: The blood pressure is at target. No change in medications is indicated. We will continue with therapeutic lifestyle changes (TLC).   Current medicines are reviewed at length with the patient today.  The patient does not have concerns regarding medicines.  The following changes have been made:  no change  Labs/ tests ordered today include: None  Orders Placed This Encounter  Procedures  . EKG 12-Lead     Disposition:   FU with me as needed.     Signed, Minus Breeding, MD  10/04/2017 3:52 PM    [facility]

## 2017-10-03 ENCOUNTER — Ambulatory Visit (INDEPENDENT_AMBULATORY_CARE_PROVIDER_SITE_OTHER): Payer: PPO | Admitting: Cardiology

## 2017-10-03 ENCOUNTER — Encounter: Payer: Self-pay | Admitting: Cardiology

## 2017-10-03 VITALS — BP 132/86 | HR 79 | Ht 61.0 in | Wt 135.0 lb

## 2017-10-03 DIAGNOSIS — R002 Palpitations: Secondary | ICD-10-CM | POA: Diagnosis not present

## 2017-10-03 DIAGNOSIS — I10 Essential (primary) hypertension: Secondary | ICD-10-CM

## 2017-10-03 DIAGNOSIS — E785 Hyperlipidemia, unspecified: Secondary | ICD-10-CM | POA: Diagnosis not present

## 2017-10-03 NOTE — Patient Instructions (Signed)
Your physician recommends that you schedule a follow-up appointment in: As Needed    

## 2017-10-04 ENCOUNTER — Encounter: Payer: Self-pay | Admitting: Cardiology

## 2018-03-26 ENCOUNTER — Other Ambulatory Visit: Payer: Self-pay | Admitting: Family Medicine

## 2018-03-26 DIAGNOSIS — H40013 Open angle with borderline findings, low risk, bilateral: Secondary | ICD-10-CM | POA: Diagnosis not present

## 2018-03-26 DIAGNOSIS — H04123 Dry eye syndrome of bilateral lacrimal glands: Secondary | ICD-10-CM | POA: Diagnosis not present

## 2018-03-28 ENCOUNTER — Encounter: Payer: PPO | Admitting: Family Medicine

## 2018-04-03 ENCOUNTER — Other Ambulatory Visit: Payer: Self-pay | Admitting: Family Medicine

## 2018-04-25 ENCOUNTER — Encounter: Payer: Self-pay | Admitting: Family Medicine

## 2018-04-25 ENCOUNTER — Other Ambulatory Visit: Payer: Self-pay

## 2018-04-25 ENCOUNTER — Ambulatory Visit (INDEPENDENT_AMBULATORY_CARE_PROVIDER_SITE_OTHER): Payer: PPO | Admitting: Family Medicine

## 2018-04-25 VITALS — BP 122/78 | HR 92 | Temp 98.0°F | Resp 16 | Ht 61.02 in | Wt 130.0 lb

## 2018-04-25 DIAGNOSIS — F5104 Psychophysiologic insomnia: Secondary | ICD-10-CM

## 2018-04-25 DIAGNOSIS — E7800 Pure hypercholesterolemia, unspecified: Secondary | ICD-10-CM

## 2018-04-25 DIAGNOSIS — K219 Gastro-esophageal reflux disease without esophagitis: Secondary | ICD-10-CM | POA: Diagnosis not present

## 2018-04-25 DIAGNOSIS — M85852 Other specified disorders of bone density and structure, left thigh: Secondary | ICD-10-CM

## 2018-04-25 DIAGNOSIS — I10 Essential (primary) hypertension: Secondary | ICD-10-CM

## 2018-04-25 DIAGNOSIS — Z0000 Encounter for general adult medical examination without abnormal findings: Secondary | ICD-10-CM

## 2018-04-25 DIAGNOSIS — Z1231 Encounter for screening mammogram for malignant neoplasm of breast: Secondary | ICD-10-CM

## 2018-04-25 DIAGNOSIS — R151 Fecal smearing: Secondary | ICD-10-CM

## 2018-04-25 DIAGNOSIS — IMO0002 Reserved for concepts with insufficient information to code with codable children: Secondary | ICD-10-CM

## 2018-04-25 DIAGNOSIS — K5901 Slow transit constipation: Secondary | ICD-10-CM

## 2018-04-25 DIAGNOSIS — M85851 Other specified disorders of bone density and structure, right thigh: Secondary | ICD-10-CM

## 2018-04-25 DIAGNOSIS — K623 Rectal prolapse: Secondary | ICD-10-CM

## 2018-04-25 DIAGNOSIS — F1021 Alcohol dependence, in remission: Secondary | ICD-10-CM

## 2018-04-25 DIAGNOSIS — M1712 Unilateral primary osteoarthritis, left knee: Secondary | ICD-10-CM

## 2018-04-25 DIAGNOSIS — Z1211 Encounter for screening for malignant neoplasm of colon: Secondary | ICD-10-CM

## 2018-04-25 LAB — CBC WITH DIFFERENTIAL/PLATELET
Basophils Absolute: 0 10*3/uL (ref 0.0–0.2)
Basos: 1 %
EOS (ABSOLUTE): 0.1 10*3/uL (ref 0.0–0.4)
EOS: 2 %
HEMATOCRIT: 41.1 % (ref 34.0–46.6)
HEMOGLOBIN: 14.1 g/dL (ref 11.1–15.9)
Immature Grans (Abs): 0 10*3/uL (ref 0.0–0.1)
Immature Granulocytes: 0 %
LYMPHS ABS: 1.9 10*3/uL (ref 0.7–3.1)
Lymphs: 31 %
MCH: 34.4 pg — ABNORMAL HIGH (ref 26.6–33.0)
MCHC: 34.3 g/dL (ref 31.5–35.7)
MCV: 100 fL — ABNORMAL HIGH (ref 79–97)
MONOCYTES: 11 %
MONOS ABS: 0.6 10*3/uL (ref 0.1–0.9)
NEUTROS ABS: 3.4 10*3/uL (ref 1.4–7.0)
Neutrophils: 55 %
Platelets: 318 10*3/uL (ref 150–379)
RBC: 4.1 x10E6/uL (ref 3.77–5.28)
RDW: 12.5 % (ref 12.3–15.4)
WBC: 6.1 10*3/uL (ref 3.4–10.8)

## 2018-04-25 LAB — COMPREHENSIVE METABOLIC PANEL
ALBUMIN: 4.4 g/dL (ref 3.5–4.8)
ALK PHOS: 54 IU/L (ref 39–117)
ALT: 17 IU/L (ref 0–32)
AST: 24 IU/L (ref 0–40)
Albumin/Globulin Ratio: 2.4 — ABNORMAL HIGH (ref 1.2–2.2)
BUN/Creatinine Ratio: 15 (ref 12–28)
BUN: 12 mg/dL (ref 8–27)
Bilirubin Total: 0.7 mg/dL (ref 0.0–1.2)
CO2: 26 mmol/L (ref 20–29)
CREATININE: 0.82 mg/dL (ref 0.57–1.00)
Calcium: 9.4 mg/dL (ref 8.7–10.3)
Chloride: 99 mmol/L (ref 96–106)
GFR calc non Af Amer: 71 mL/min/{1.73_m2} (ref >59)
GFR, EST AFRICAN AMERICAN: 82 mL/min/{1.73_m2} (ref >59)
GLOBULIN, TOTAL: 1.8 g/dL (ref 1.5–4.5)
Glucose: 92 mg/dL (ref 65–99)
POTASSIUM: 3.7 mmol/L (ref 3.5–5.2)
Sodium: 141 mmol/L (ref 134–144)
Total Protein: 6.2 g/dL (ref 6.0–8.5)

## 2018-04-25 LAB — POCT URINALYSIS DIP (MANUAL ENTRY)
BILIRUBIN UA: NEGATIVE mg/dL
Bilirubin, UA: NEGATIVE
Blood, UA: NEGATIVE
Glucose, UA: NEGATIVE mg/dL
LEUKOCYTES UA: NEGATIVE
Nitrite, UA: NEGATIVE
PH UA: 7 (ref 5.0–8.0)
PROTEIN UA: NEGATIVE mg/dL
SPEC GRAV UA: 1.015 (ref 1.010–1.025)
Urobilinogen, UA: 0.2 E.U./dL

## 2018-04-25 LAB — LIPID PANEL
Chol/HDL Ratio: 2.1 ratio (ref 0.0–4.4)
Cholesterol, Total: 214 mg/dL — ABNORMAL HIGH (ref 100–199)
HDL: 100 mg/dL (ref >39)
LDL Calculated: 102 mg/dL — ABNORMAL HIGH (ref 0–99)
Triglycerides: 60 mg/dL (ref 0–149)
VLDL Cholesterol Cal: 12 mg/dL (ref 5–40)

## 2018-04-25 MED ORDER — ROSUVASTATIN CALCIUM 10 MG PO TABS: 10.0000 mg | ORAL_TABLET | Freq: Every day | ORAL | 3 refills | Status: AC

## 2018-04-25 MED ORDER — LISINOPRIL-HYDROCHLOROTHIAZIDE 20-25 MG PO TABS: 1.0000 | ORAL_TABLET | Freq: Every day | ORAL | 3 refills | Status: AC

## 2018-04-25 MED ORDER — TRAZODONE HCL 150 MG PO TABS: 150.0000 mg | ORAL_TABLET | Freq: Every day | ORAL | 3 refills | Status: AC

## 2018-04-25 MED ORDER — POLYETHYLENE GLYCOL 3350 17 GM/SCOOP PO POWD: 17.0000 g | Freq: Every day | ORAL | 3 refills | Status: DC

## 2018-04-25 NOTE — Patient Instructions (Addendum)
   IF you received an x-ray today, you will receive an invoice from [facility]. Please contact  Radiology at [phone] with questions or concerns regarding your invoice.   IF you received labwork today, you will receive an invoice from [facility]. Please contact [facility] at [phone] with questions or concerns regarding your invoice.   Our billing staff will not be able to assist you with questions regarding bills from these companies.  You will be contacted with the lab results as soon as they are available. The fastest way to get your results is to activate your My Chart account. Instructions are located on the last page of this paperwork. If you have not heard from us regarding the results in 2 weeks, please contact this office.      Preventive Care 65 Years and Older, Female Preventive care refers to lifestyle choices and visits with your health care provider that can promote health and wellness. What does preventive care include?  A yearly physical exam. This is also called an annual well check.  Dental exams once or twice a year.  Routine eye exams. Ask your health care provider how often you should have your eyes checked.  Personal lifestyle choices, including: ? Daily care of your teeth and gums. ? Regular physical activity. ? Eating a healthy diet. ? Avoiding tobacco and drug use. ? Limiting alcohol use. ? Practicing safe sex. ? Taking low-dose aspirin every day. ? Taking vitamin and mineral supplements as recommended by your health care provider. What happens during an annual well check? The services and screenings done by your health care provider during your annual well check will depend on your age, overall health, lifestyle risk factors, and family history of disease. Counseling Your health care provider may ask you questions about your:  Alcohol use.  Tobacco use.  Drug use.  Emotional well-being.  Home and relationship  well-being.  Sexual activity.  Eating habits.  History of falls.  Memory and ability to understand (cognition).  Work and work environment.  Reproductive health.  Screening You may have the following tests or measurements:  Height, weight, and BMI.  Blood pressure.  Lipid and cholesterol levels. These may be checked every 5 years, or more frequently if you are over 50 years old.  Skin check.  Lung cancer screening. You may have this screening every year starting at age 55 if you have a 30-pack-year history of smoking and currently smoke or have quit within the past 15 years.  Fecal occult blood test (FOBT) of the stool. You may have this test every year starting at age 50.  Flexible sigmoidoscopy or colonoscopy. You may have a sigmoidoscopy every 5 years or a colonoscopy every 10 years starting at age 50.  Hepatitis C blood test.  Hepatitis B blood test.  Sexually transmitted disease (STD) testing.  Diabetes screening. This is done by checking your blood sugar (glucose) after you have not eaten for a while (fasting). You may have this done every 1-3 years.  Bone density scan. This is done to screen for osteoporosis. You may have this done starting at age 65.  Mammogram. This may be done every 1-2 years. Talk to your health care provider about how often you should have regular mammograms.  Talk with your health care provider about your test results, treatment options, and if necessary, the need for more tests. Vaccines Your health care provider may recommend certain vaccines, such as:  Influenza vaccine. This is recommended every year.    Tetanus, diphtheria, and acellular pertussis (Tdap, Td) vaccine. You may need a Td booster every 10 years.  Varicella vaccine. You may need this if you have not been vaccinated.  Zoster vaccine. You may need this after age 60.  Measles, mumps, and rubella (MMR) vaccine. You may need at least one dose of MMR if you were born in  1957 or later. You may also need a second dose.  Pneumococcal 13-valent conjugate (PCV13) vaccine. One dose is recommended after age 65.  Pneumococcal polysaccharide (PPSV23) vaccine. One dose is recommended after age 65.  Meningococcal vaccine. You may need this if you have certain conditions.  Hepatitis A vaccine. You may need this if you have certain conditions or if you travel or work in places where you may be exposed to hepatitis A.  Hepatitis B vaccine. You may need this if you have certain conditions or if you travel or work in places where you may be exposed to hepatitis B.  Haemophilus influenzae type b (Hib) vaccine. You may need this if you have certain conditions.  Talk to your health care provider about which screenings and vaccines you need and how often you need them. This information is not intended to replace advice given to you by your health care provider. Make sure you discuss any questions you have with your health care provider. Document Released: 01/01/2016 Document Revised: 08/24/2016 Document Reviewed: 10/06/2015 Elsevier Interactive Patient Education  2018 Elsevier Inc.  

## 2018-04-25 NOTE — Progress Notes (Signed)
Subjective:    Patient ID: Cheryl Kent, female    DOB: [birth year], 72 y.o.   MRN: [id number]  04/25/2018  Medicare Wellness    HPI This 72 y.o. female presents for COMPLETE PHYSICAL EXAMINATION, ANNUAL WELLNESS EXAMINATION.  Last physical: 04-15-2017 Pap smear:  2011 Mammogram:  05-10-2017 Colonoscopy:  05-2012; never received cologuard last year. Bone density:  05-10-2017    Visual Acuity Screening   Right eye Left eye Both eyes  Without correction:     With correction: 20/15 20/15 20/15     BP Readings from Last 3 Encounters:  04/25/18 122/78  10/03/17 132/86  09/25/17 120/70   Wt Readings from Last 3 Encounters:  04/25/18 130 lb (59 kg)  10/03/17 135 lb (61.2 kg)  09/25/17 132 lb (59.9 kg)   Immunization History  Administered Date(s) Administered  . Influenza,inj,Quad PF,6+ Mos 09/21/2016, 09/25/2017  . Influenza-Unspecified 08/19/2013, 09/23/2014, 09/24/2015  . Pneumococcal Conjugate-13 09/07/2015  . Pneumococcal Polysaccharide-23 12/19/2006  . Td 07/20/2011  . Tdap 12/20/2004  . Zoster 12/24/2006  . Zoster Recombinat (Shingrix) 04/12/2017   Health Maintenance  Topic Date Due  . INFLUENZA VACCINE  07/19/2018  . MAMMOGRAM  05/11/2019  . TETANUS/TDAP  07/19/2021  . COLONOSCOPY  05/23/2022  . DEXA SCAN  Completed  . Hepatitis C Screening  Completed   Management changes made at last visit include the following: -controlled blood pressure and cholesterol; obtain labs for chronic disease management; no change in management. -new onset osteopenia with FRAX score of 6% for hip fracture in upcoming ten years; current guidelines recommend treating with bisphosophonate; rx for Fosamax provided; Current guidelines recommend the following additional treatment: 1.  Calcium 600mg  twice daily OR three servings of dairy daily.  2.  Vitamin D [phone] IU daily.  3.  Daily weight bearing exercise for 30 minutes.  4.  Repeat bone density scan in 2 years. -new onset  palpitations; refer to cardiology for event monitor to rule out arrhythmia. -suffering with ongoing knee pain; s/p Synvisc injection recently.     UPDATE: Took one dose of Fosamax and vomited secondary to reflux the next day.  Taking Omeprazole so should have not occurred.  Did not take more Fosamax.  GERD: stopped Omeprazole; taking generic Zantac.  Really scared Omeprazole.  Stopped eating at night.   HTN: Patient reports good compliance with medication, good tolerance to medication, and good symptom control.  Quit ASA since visit.   Hypercholesterolemia: Patient reports good compliance with medication, good tolerance to medication, and good symptom control.    OA Knee LEFT: s/p gel shot eight months ago; bone on bone; avoiding knee replacement. Still exercising all the time.  Wearing brace.  Elliptical and treadmill.   Insomnia: taking Trazodone; researching Unisom; trying not to take it.  Taking Trazodone 150mg  qhs.   Review of Systems  Constitutional: Negative for activity change, appetite change, chills, diaphoresis, fatigue, fever and unexpected weight change.  HENT: Negative for congestion, dental problem, drooling, ear discharge, ear pain, facial swelling, hearing loss, mouth sores, nosebleeds, postnasal drip, rhinorrhea, sinus pressure, sneezing, sore throat, tinnitus, trouble swallowing and voice change.   Eyes: Negative for photophobia, pain, discharge, redness, itching and visual disturbance.  Respiratory: Negative for apnea, cough, choking, chest tightness, shortness of breath, wheezing and stridor.   Cardiovascular: Negative for chest pain, palpitations and leg swelling.  Gastrointestinal: Positive for constipation. Negative for abdominal distention, abdominal pain, anal bleeding, blood in stool, diarrhea, nausea, rectal pain and vomiting.  Endocrine: Negative  for cold intolerance, heat intolerance, polydipsia, polyphagia and polyuria.  Genitourinary: Negative for decreased  urine volume, difficulty urinating, dyspareunia, dysuria, enuresis, flank pain, frequency, genital sores, hematuria, menstrual problem, pelvic pain, urgency, vaginal bleeding, vaginal discharge and vaginal pain.       Nocturia x 3-4.  NO URINARY LEAKAGE.  Musculoskeletal: Negative for arthralgias, back pain, gait problem, joint swelling, myalgias, neck pain and neck stiffness.  Skin: Negative for color change, pallor, rash and wound.  Allergic/Immunologic: Negative for environmental allergies, food allergies and immunocompromised state.  Neurological: Negative for dizziness, tremors, seizures, syncope, facial asymmetry, speech difficulty, weakness, light-headedness, numbness and headaches.  Hematological: Negative for adenopathy. Does not bruise/bleed easily.  Psychiatric/Behavioral: Positive for sleep disturbance. Negative for agitation, behavioral problems, confusion, decreased concentration, dysphoric mood, hallucinations, self-injury and suicidal ideas. The patient is not nervous/anxious and is not hyperactive.        Bedtime 1000; wakes up 700am. Staying asleep is the issue.    Past Medical History:  Diagnosis Date  . Allergy    previous allergy shots.    . Anxiety   . Arthritis    DDD lumbar spine, hands B  . Cataract   . Fecal incontinence   . GERD (gastroesophageal reflux disease)   . Hyperlipemia   . Hypertension   . 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  2012.  Marland Kitchen Rectal prolapse   . Rosacea conjunctivitis   . Substance abuse ([place])   . Tuberculosis 2012   Past Surgical History:  Procedure Laterality Date  . BREAST EXCISIONAL BIOPSY Right    benign x 2  . BREAST EXCISIONAL BIOPSY Left    benign  . BREAST SURGERY     #1 age 15 yo  #2 age 62's  . EYE SURGERY Left 10/07/2014   Cataract removal- Dr. Herbert Deaner  . EYE SURGERY Right 11/18/2014   Cataract removal- Dr. Herbert Deaner  . FOOT SURGERY    . HAND SURGERY    . SHOULDER SURGERY    .  TONSILLECTOMY AND ADENOIDECTOMY     childhood  . TUBAL LIGATION     Allergies  Allergen Reactions  . Penicillins Other (See Comments)    infant  . Pneumococcal Vaccines   . Sulfamethoxazole-Trimethoprim   . Sulfonamide Derivatives    Current Outpatient Medications on File Prior to Visit  Medication Sig Dispense Refill  . Calcium Carbonate-Vitamin D (CALCIUM + D PO) Take by mouth daily.    . Cholecalciferol (VITAMIN D) 2000 UNITS CAPS Take by mouth daily.    . Coenzyme Q10 (COQ10) 100 MG CAPS Take by mouth daily.    Marland Kitchen doxylamine, Sleep, (UNISOM) 25 MG tablet Take 25 mg by mouth at bedtime as needed.    . Lactobacillus (DIGESTIVE HEALTH PROBIOTIC PO) Take by mouth. Digestive Advantage 1 gummie chewed daily.    . Magnesium 300 MG CAPS Take by mouth daily.    . Multiple Vitamin (MULTIVITAMIN) tablet Take 1 tablet by mouth daily.     No current facility-administered medications on file prior to visit.    Social History   Socioeconomic History  . Marital status: Widowed    Spouse name: Not on file  . Number of children: 0  . Years of education: Not on file  . Highest education level: Not on file  Occupational History  . Occupation: retired  Scientific laboratory technician  . Financial resource strain: Not on file  . Food insecurity:    Worry: Not on file  Inability: Not on file  . Transportation needs:    Medical: Not on file    Non-medical: Not on file  Tobacco Use  . Smoking status: Former Smoker    Packs/day: 1.50    Years: 25.00    Pack years: 37.50    Types: Cigarettes    Last attempt to quit: 12/20/1983    Years since quitting: 34.5  . Smokeless tobacco: Never Used  Substance and Sexual Activity  . Alcohol use: No    Alcohol/week: 0.0 oz    Comment: former heavy use/alcoholism; quit in 1980 with treatment  . Drug use: No    Comment: former abuse of prescription drugs during 1970s.  . Sexual activity: Not Currently    Partners: Female    Birth control/protection:  Post-menopausal, Surgical  Lifestyle  . Physical activity:    Days per week: Not on file    Minutes per session: Not on file  . Stress: Not on file  Relationships  . Social connections:    Talks on phone: Not on file    Gets together: Not on file    Attends religious service: Not on file    Active member of club or organization: Not on file    Attends meetings of clubs or organizations: Not on file    Relationship status: Not on file  . Intimate partner violence:    Fear of current or ex partner: Not on file    Emotionally abused: Not on file    Physically abused: Not on file    Forced sexual activity: Not on file  Other Topics Concern  . Not on file  Social History Narrative   Marital status:  Widowed since 2007.  Widowed twice.  Not dating but interested in 2019.      Children: none      Lives: alone in house.  2 cats.      Employment:  Retired; at age 56; pet sits alot      Tobacco: quit 30 years ago      Alcohol: quit in 1980; in recovery since 1980s.      Drugs: none now; history of prescription drug abuse (Valium, anything I could get) in 1970s.      Exercise: cardio,weights, and abs 4-5 x a week for 1 hour      ADLs: independent with ADLs; drives; no assistant devices.      Advanced Directives; yes; stepdaughter/Debbie is HCPOA; DNI.   Family History  Problem Relation Age of Onset  . Stroke Mother 74       multiple CVAs  . Hypertension Mother   . Hyperlipidemia Mother   . Osteoporosis Mother   . Mental illness Mother        paranoid schizophrenia  . Alzheimer's disease Father   . Diabetes Father   . Hypertension Father   . Cancer Father        prostate  . Osteoporosis Sister   . Kidney disease Sister   . Stroke Sister   . Hyperlipidemia Sister   . Hypertension Sister   . Mental illness Sister        depression; multiple behavior health admissions  . Cancer Sister        leukemia  . Osteoporosis Maternal Grandmother   . Stroke Maternal Grandmother   .  Osteoporosis Paternal Grandmother   . Alzheimer's disease Paternal Grandfather   . Cancer Cousin        MATERNAL IST COUSIN - BREAST CA  Objective:    BP 122/78   Pulse 92   Temp 98 F (36.7 C) (Oral)   Resp 16   Ht 5' 1.02" (1.55 m)   Wt 130 lb (59 kg)   SpO2 95%   BMI 24.54 kg/m  Physical Exam  Constitutional: She is oriented to person, place, and time. She appears well-developed and well-nourished. No distress.  HENT:  Head: Normocephalic and atraumatic.  Right Ear: External ear normal.  Left Ear: External ear normal.  Nose: Nose normal.  Mouth/Throat: Oropharynx is clear and moist.  Eyes: Pupils are equal, round, and reactive to light. Conjunctivae and EOM are normal.  Neck: Normal range of motion and full passive range of motion without pain. Neck supple. No JVD present. Carotid bruit is not present. No thyromegaly present.  Cardiovascular: Normal rate, regular rhythm and normal heart sounds. Exam reveals no gallop and no friction rub.  No murmur heard. Pulmonary/Chest: Effort normal and breath sounds normal. She has no wheezes. She has no rales. Right breast exhibits no inverted nipple, no mass, no nipple discharge, no skin change and no tenderness. Left breast exhibits no inverted nipple, no mass, no nipple discharge, no skin change and no tenderness. No breast swelling, tenderness, discharge or bleeding. Breasts are symmetrical.  Abdominal: Soft. Bowel sounds are normal. She exhibits no distension and no mass. There is no tenderness. There is no rebound and no guarding.  Musculoskeletal:       Right shoulder: Normal.       Left shoulder: Normal.       Cervical back: Normal.  Lymphadenopathy:    She has no cervical adenopathy.  Neurological: She is alert and oriented to person, place, and time. She has normal reflexes. No cranial nerve deficit. She exhibits normal muscle tone. Coordination normal.  Skin: Skin is warm and dry. No rash noted. She is not diaphoretic.  No erythema. No pallor.  Psychiatric: She has a normal mood and affect. Her behavior is normal. Judgment and thought content normal.  Nursing note and vitals reviewed.  No results found. Depression screen [facility] 2/9 04/25/2018 09/25/2017 04/12/2017 02/01/2017 11/08/2016  Decreased Interest 0 0 0 0 0  Down, Depressed, Hopeless 0 0 0 0 0  PHQ - 2 Score 0 0 0 0 0   Fall Risk  04/25/2018 09/25/2017 04/12/2017 02/01/2017 11/08/2016  Falls in the past year? No No No No No  Number falls in past yr: - - - - -  Injury with Fall? - - - - -   Functional Status Survey: Is the patient deaf or have difficulty hearing?: No Does the patient have difficulty seeing, even when wearing glasses/contacts?: No Does the patient have difficulty concentrating, remembering, or making decisions?: No Does the patient have difficulty walking or climbing stairs?: No Does the patient have difficulty dressing or bathing?: No Does the patient have difficulty doing errands alone such as visiting a doctor's office or shopping?: No      Assessment & Plan:   1. Medicare annual wellness visit, subsequent   2. Routine physical examination   3. Essential hypertension   4. Gastroesophageal reflux disease without esophagitis   5. Hypercholesteremia   6. Colon cancer screening   7. Primary osteoarthritis of left knee   8. Rectal prolapse   9. Alcoholism in recovery ([place])   10. Fecal smearing   11. Slow transit constipation   12. Psychophysiological insomnia   13. History of prescription drug abuse   14. Encounter for screening  mammogram for breast cancer   -anticipatory guidance provided --- exercise, weight loss, safe driving practices, aspirin 81mg  daily. -obtain age appropriate screening labs and labs for chronic disease management. -moderate fall risk; no evidence of depression; no evidence of hearing loss.  Discussed advanced directives and living will; also discussed end of life issues including code status.  -stable  chronic medical conditions; obtain labs for disease management; refills provided. -osteopenia: patient intolerant to Fosamax secondary to worsening GERD.  Consider trial of Actonel or Boniva in future.    Orders Placed This Encounter  Procedures  . MM Digital Screening    Standing Status:   Future    Standing Expiration Date:   09/09/2019    Order Specific Question:   Reason for Exam (SYMPTOM  OR DIAGNOSIS REQUIRED)    Answer:   annual screening    Order Specific Question:   Preferred imaging location?    Answer:   [facility]  . CBC with Differential/Platelet  . Comprehensive metabolic panel    Order Specific Question:   Has the patient fasted?    Answer:   No  . Lipid panel    Order Specific Question:   Has the patient fasted?    Answer:   No  . Cologuard  . POCT urinalysis dipstick   Meds ordered this encounter  Medications  . lisinopril-hydrochlorothiazide (PRINZIDE,ZESTORETIC) 20-25 MG tablet    Sig: Take 1 tablet by mouth daily.    Dispense:  90 tablet    Refill:  3  . polyethylene glycol powder (GLYCOLAX/MIRALAX) powder    Sig: Take 17 g by mouth daily. Use as needed for constipation    Dispense:  850 g    Refill:  3  . rosuvastatin (CRESTOR) 10 MG tablet    Sig: Take 1 tablet (10 mg total) by mouth daily.    Dispense:  90 tablet    Refill:  3  . traZODone (DESYREL) 150 MG tablet    Sig: Take 1 tablet (150 mg total) by mouth at bedtime.    Dispense:  90 tablet    Refill:  3    Return in about 6 months (around 10/26/2018) for follow-up chronic medical conditions [place].   Kristi Elayne Guerin, M.D. Primary Care at [facility] [address] [place], [place]  [address] [phone] phone [phone] fax

## 2018-04-29 ENCOUNTER — Other Ambulatory Visit: Payer: Self-pay | Admitting: Family Medicine

## 2018-05-03 ENCOUNTER — Other Ambulatory Visit: Payer: Self-pay | Admitting: Family Medicine

## 2018-05-03 NOTE — Telephone Encounter (Signed)
Med refill Tramadol sent to Dr. Tamala Julian

## 2018-05-03 NOTE — Telephone Encounter (Signed)
Tramadol refill request  Patient is requesting a refill of the following medications: Requested Prescriptions   Pending Prescriptions Disp Refills  . traMADol (ULTRAM) 50 MG tablet 60 tablet 0    Sig: TAKE 1 TABLE BY MOUTH EVERY 12 HOURS AS NEEDED    Date of patient request: 04/29/2018 Last office visit: 04/25/2018 Date of last refill: 04/12/2017 Last refill amount: 60 Follow up time period per chart: 67mo  (10/26/2018)

## 2018-05-06 NOTE — Telephone Encounter (Signed)
Patient is requesting a refill of the following medications: Requested Prescriptions   Pending Prescriptions Disp Refills  . traMADol (ULTRAM) 50 MG tablet 60 tablet 0    Sig: TAKE 1 TABLE BY MOUTH EVERY 12 HOURS AS NEEDED    Date of patient request: 04/29/2018 - under RxReq inbox Last office visit: 04/25/2018  Date of last refill: 04/12/2017 Last refill amount: #60  -0-RF Follow up time period per chart: 42mo with [place]

## 2018-05-08 MED ORDER — TRAMADOL HCL 50 MG PO TABS: 50.0000 mg | ORAL_TABLET | Freq: Four times a day (QID) | ORAL | 0 refills | Status: DC | PRN

## 2018-05-16 ENCOUNTER — Encounter: Payer: Self-pay | Admitting: Family Medicine

## 2018-06-17 ENCOUNTER — Telehealth: Payer: Self-pay | Admitting: Family Medicine

## 2018-06-17 NOTE — Telephone Encounter (Signed)
Patient left message on 05/24/18 that she has not received Cologuard that was ordered on 04/25/18.  Please contact Cologuard company to check status.

## 2018-06-19 DIAGNOSIS — L821 Other seborrheic keratosis: Secondary | ICD-10-CM | POA: Diagnosis not present

## 2018-06-19 DIAGNOSIS — L570 Actinic keratosis: Secondary | ICD-10-CM | POA: Diagnosis not present

## 2018-06-19 DIAGNOSIS — D229 Melanocytic nevi, unspecified: Secondary | ICD-10-CM | POA: Diagnosis not present

## 2018-07-03 ENCOUNTER — Encounter: Payer: Self-pay | Admitting: Family Medicine

## 2018-07-03 DIAGNOSIS — Z1211 Encounter for screening for malignant neoplasm of colon: Secondary | ICD-10-CM | POA: Diagnosis not present

## 2018-07-03 LAB — COLOGUARD

## 2018-07-08 DIAGNOSIS — M85852 Other specified disorders of bone density and structure, left thigh: Secondary | ICD-10-CM

## 2018-07-08 DIAGNOSIS — M85851 Other specified disorders of bone density and structure, right thigh: Secondary | ICD-10-CM | POA: Insufficient documentation

## 2018-07-20 LAB — COLOGUARD: COLOGUARD: NEGATIVE

## 2018-09-24 DIAGNOSIS — G8929 Other chronic pain: Secondary | ICD-10-CM | POA: Diagnosis not present

## 2018-09-24 DIAGNOSIS — M1712 Unilateral primary osteoarthritis, left knee: Secondary | ICD-10-CM | POA: Diagnosis not present

## 2018-09-26 ENCOUNTER — Ambulatory Visit (INDEPENDENT_AMBULATORY_CARE_PROVIDER_SITE_OTHER): Payer: PPO | Admitting: Family Medicine

## 2018-09-26 DIAGNOSIS — H04123 Dry eye syndrome of bilateral lacrimal glands: Secondary | ICD-10-CM | POA: Diagnosis not present

## 2018-09-26 DIAGNOSIS — Z23 Encounter for immunization: Secondary | ICD-10-CM

## 2018-09-26 DIAGNOSIS — H35363 Drusen (degenerative) of macula, bilateral: Secondary | ICD-10-CM | POA: Diagnosis not present

## 2018-09-26 DIAGNOSIS — Z961 Presence of intraocular lens: Secondary | ICD-10-CM | POA: Diagnosis not present

## 2018-09-26 DIAGNOSIS — H40013 Open angle with borderline findings, low risk, bilateral: Secondary | ICD-10-CM | POA: Diagnosis not present

## 2018-10-18 DIAGNOSIS — H903 Sensorineural hearing loss, bilateral: Secondary | ICD-10-CM | POA: Diagnosis not present

## 2018-10-26 ENCOUNTER — Ambulatory Visit (INDEPENDENT_AMBULATORY_CARE_PROVIDER_SITE_OTHER): Payer: PPO | Admitting: Family Medicine

## 2018-10-26 ENCOUNTER — Encounter: Payer: Self-pay | Admitting: Family Medicine

## 2018-10-26 VITALS — BP 114/73 | HR 76 | Temp 98.0°F | Resp 16 | Ht 61.0 in | Wt 133.0 lb

## 2018-10-26 DIAGNOSIS — I10 Essential (primary) hypertension: Secondary | ICD-10-CM | POA: Diagnosis not present

## 2018-10-26 DIAGNOSIS — D7589 Other specified diseases of blood and blood-forming organs: Secondary | ICD-10-CM | POA: Diagnosis not present

## 2018-10-26 DIAGNOSIS — E7800 Pure hypercholesterolemia, unspecified: Secondary | ICD-10-CM

## 2018-10-26 LAB — COMPREHENSIVE METABOLIC PANEL
ALT: 22 IU/L (ref 0–32)
AST: 25 IU/L (ref 0–40)
Albumin/Globulin Ratio: 2.5 — ABNORMAL HIGH (ref 1.2–2.2)
Albumin: 4.3 g/dL (ref 3.5–4.8)
Alkaline Phosphatase: 56 IU/L (ref 39–117)
BUN/Creatinine Ratio: 11 — ABNORMAL LOW (ref 12–28)
BUN: 10 mg/dL (ref 8–27)
Bilirubin Total: 0.7 mg/dL (ref 0.0–1.2)
CO2: 27 mmol/L (ref 20–29)
Calcium: 9.7 mg/dL (ref 8.7–10.3)
Chloride: 97 mmol/L (ref 96–106)
Creatinine, Ser: 0.91 mg/dL (ref 0.57–1.00)
GFR calc Af Amer: 72 mL/min/{1.73_m2} (ref >59)
GFR calc non Af Amer: 62 mL/min/{1.73_m2} (ref >59)
Globulin, Total: 1.7 g/dL (ref 1.5–4.5)
Glucose: 91 mg/dL (ref 65–99)
Potassium: 3.9 mmol/L (ref 3.5–5.2)
Sodium: 140 mmol/L (ref 134–144)
Total Protein: 6 g/dL (ref 6.0–8.5)

## 2018-10-26 LAB — CBC
Hematocrit: 39 % (ref 34.0–46.6)
Hemoglobin: 13.8 g/dL (ref 11.1–15.9)
MCH: 35.2 pg — ABNORMAL HIGH (ref 26.6–33.0)
MCHC: 35.4 g/dL (ref 31.5–35.7)
MCV: 100 fL — ABNORMAL HIGH (ref 79–97)
Platelets: 330 10*3/uL (ref 150–450)
RBC: 3.92 x10E6/uL (ref 3.77–5.28)
RDW: 11.8 % — ABNORMAL LOW (ref 12.3–15.4)
WBC: 4.6 10*3/uL (ref 3.4–10.8)

## 2018-10-26 LAB — LIPID PANEL
Chol/HDL Ratio: 2.2 ratio (ref 0.0–4.4)
Cholesterol, Total: 217 mg/dL — ABNORMAL HIGH (ref 100–199)
HDL: 98 mg/dL (ref >39)
LDL Calculated: 104 mg/dL — ABNORMAL HIGH (ref 0–99)
Triglycerides: 74 mg/dL (ref 0–149)
VLDL Cholesterol Cal: 15 mg/dL (ref 5–40)

## 2018-10-26 NOTE — Patient Instructions (Addendum)
  b-complex vitamin  Folic acid 425 mcg    If you have lab work done today you will be contacted with your lab results within the next 2 weeks.  If you have not heard from [place] then please contact us. The fastest way to get your results is to register for My Chart.   IF you received an x-ray today, you will receive an invoice from [facility]. Please contact [facility] at [phone] with questions or concerns regarding your invoice.   IF you received labwork today, you will receive an invoice from [facility]. Please contact [facility] at [phone] with questions or concerns regarding your invoice.   Our billing staff will not be able to assist you with questions regarding bills from these companies.  You will be contacted with the lab results as soon as they are available. The fastest way to get your results is to activate your My Chart account. Instructions are located on the last page of this paperwork. If you have not heard from [place] regarding the results in 2 weeks, please contact this office.

## 2018-10-26 NOTE — Progress Notes (Signed)
11/8/20199:07 AM  Cheryl Kent 10/02/1946, 72 y.o. female [id number]  Chief Complaint  Patient presents with  . Follow-up    6 month/ bloodwork    HPI:   Patient is a 72 y.o. female with past medical history significant for HTN, HLP, GERD, OA of knee who presents today for routine followup  Previous PCP Dr Tamala Julian Last visit in May 2019  Lab Results  Component Value Date   CHOL 214 (H) 04/25/2018   HDL 100 04/25/2018   LDLCALC 102 (H) 04/25/2018   TRIG 60 04/25/2018   CHOLHDL 2.1 04/25/2018    Fall Risk  04/25/2018 09/25/2017 04/12/2017 02/01/2017 11/08/2016  Falls in the past year? No No No No No  Number falls in past yr: - - - - -  Injury with Fall? - - - - -     Depression screen [facility] 2/9 04/25/2018 09/25/2017 04/12/2017  Decreased Interest 0 0 0  Down, Depressed, Hopeless 0 0 0  PHQ - 2 Score 0 0 0    Allergies  Allergen Reactions  . Penicillins Other (See Comments)    infant  . Pneumococcal Vaccines   . Sulfamethoxazole-Trimethoprim   . Sulfonamide Derivatives     Prior to Admission medications   Medication Sig Start Date End Date Taking? Authorizing Provider  Calcium Carbonate-Vitamin D (CALCIUM + D PO) Take by mouth daily.   Yes [provider]  Cholecalciferol (VITAMIN D) 2000 UNITS CAPS Take by mouth daily.   Yes [provider]  Coenzyme Q10 (COQ10) 100 MG CAPS Take by mouth daily.   Yes [provider]  doxylamine, Sleep, (UNISOM) 25 MG tablet Take 25 mg by mouth at bedtime as needed.   Yes [provider]  Lactobacillus (DIGESTIVE HEALTH PROBIOTIC PO) Take by mouth. Digestive Advantage 1 gummie chewed daily.   Yes [provider]  lisinopril-hydrochlorothiazide (PRINZIDE,ZESTORETIC) 20-25 MG tablet Take 1 tablet by mouth daily. 04/25/18  Yes Wardell Honour, MD  Magnesium 300 MG CAPS Take by mouth daily.   Yes [provider]  Multiple Vitamin (MULTIVITAMIN) tablet Take 1 tablet by mouth daily.   Yes  [provider]  polyethylene glycol powder (GLYCOLAX/MIRALAX) powder Take 17 g by mouth daily. Use as needed for constipation 04/25/18  Yes Wardell Honour, MD  rosuvastatin (CRESTOR) 10 MG tablet Take 1 tablet (10 mg total) by mouth daily. 04/25/18  Yes Wardell Honour, MD  traMADol (ULTRAM) 50 MG tablet Take 1 tablet (50 mg total) by mouth every 6 (six) hours as needed. TAKE 1 TABLE BY MOUTH EVERY 12 HOURS AS NEEDED 05/08/18  Yes Wardell Honour, MD  traZODone (DESYREL) 150 MG tablet Take 1 tablet (150 mg total) by mouth at bedtime. 04/25/18  Yes Wardell Honour, MD    Past Medical History:  Diagnosis Date  . Allergy    previous allergy shots.    . Anxiety   . Arthritis    DDD lumbar spine, hands B  . Cataract   . Fecal incontinence   . GERD (gastroesophageal reflux disease)   . Hyperlipemia   . Hypertension   . 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  2012.  Marland Kitchen Rectal prolapse   . Rosacea conjunctivitis   . Substance abuse ([place])   . Tuberculosis 2012    Past Surgical History:  Procedure Laterality Date  . BREAST EXCISIONAL BIOPSY Right    benign x 2  . BREAST EXCISIONAL BIOPSY Left  benign  . BREAST SURGERY     #1 age 27 yo  #2 age 79's  . EYE SURGERY Left 10/07/2014   Cataract removal- Dr. Herbert Deaner  . EYE SURGERY Right 11/18/2014   Cataract removal- Dr. Herbert Deaner  . FOOT SURGERY    . HAND SURGERY    . SHOULDER SURGERY    . TONSILLECTOMY AND ADENOIDECTOMY     childhood  . TUBAL LIGATION      Social History   Tobacco Use  . Smoking status: Former Smoker    Packs/day: 1.50    Years: 25.00    Pack years: 37.50    Types: Cigarettes    Last attempt to quit: 12/20/1983    Years since quitting: 34.8  . Smokeless tobacco: Never Used  Substance Use Topics  . Alcohol use: No    Alcohol/week: 0.0 standard drinks    Comment: former heavy use/alcoholism; quit in 1980 with treatment    Family History  Problem Relation Age of  Onset  . Stroke Mother 13       multiple CVAs  . Hypertension Mother   . Hyperlipidemia Mother   . Osteoporosis Mother   . Mental illness Mother        paranoid schizophrenia  . Alzheimer's disease Father   . Diabetes Father   . Hypertension Father   . Cancer Father        prostate  . Osteoporosis Sister   . Kidney disease Sister   . Stroke Sister   . Hyperlipidemia Sister   . Hypertension Sister   . Mental illness Sister        depression; multiple behavior health admissions  . Cancer Sister        leukemia  . Osteoporosis Maternal Grandmother   . Stroke Maternal Grandmother   . Osteoporosis Paternal Grandmother   . Alzheimer's disease Paternal Grandfather   . Cancer Cousin        MATERNAL IST COUSIN - BREAST CA    Review of Systems  Constitutional: Negative for chills and fever.  Respiratory: Negative for cough and shortness of breath.   Cardiovascular: Negative for chest pain, palpitations and leg swelling.  Gastrointestinal: Negative for abdominal pain, nausea and vomiting.     OBJECTIVE:  Blood pressure 114/73, pulse 76, temperature 98 F (36.7 C), temperature source Oral, resp. rate 16, height 5\' 1"  (1.549 m), weight 133 lb (60.3 kg), SpO2 96 %. Body mass index is 25.13 kg/m.   Wt Readings from Last 3 Encounters:  10/26/18 133 lb (60.3 kg)  04/25/18 130 lb (59 kg)  10/03/17 135 lb (61.2 kg)   BP Readings from Last 3 Encounters:  10/26/18 114/73  04/25/18 122/78  10/03/17 132/86    Physical Exam  Constitutional: She is oriented to person, place, and time. She appears well-developed and well-nourished.  HENT:  Head: Normocephalic and atraumatic.  Mouth/Throat: Oropharynx is clear and moist. No oropharyngeal exudate.  Eyes: Pupils are equal, round, and reactive to light. Conjunctivae and EOM are normal. No scleral icterus.  Neck: Neck supple.  Cardiovascular: Normal rate, regular rhythm and normal heart sounds. Exam reveals no gallop and no friction  rub.  No murmur heard. Pulmonary/Chest: Effort normal and breath sounds normal. She has no wheezes. She has no rales.  Musculoskeletal: She exhibits no edema.  Neurological: She is alert and oriented to person, place, and time.  Skin: Skin is warm and dry.  Psychiatric: She has a normal mood and affect.  Nursing note and  vitals reviewed.  ASSESSMENT and PLAN  1. Essential hypertension Controlled. Continue current regime.  - Comprehensive metabolic panel  2. Hypercholesteremia Checking labs today, medications will be adjusted as needed.  - Lipid panel - Comprehensive metabolic panel  3. Macrocytosis without anemia Discussed starting vitamin b and folic acid supplementation - CBC  Return in about 6 months (around 04/26/2019) for Medicare wellness.    Rutherford Guys, MD Primary Care at [facility] [place], [place] [address] Ph.  [phone] Fax [phone]

## 2018-10-29 DIAGNOSIS — H903 Sensorineural hearing loss, bilateral: Secondary | ICD-10-CM | POA: Diagnosis not present

## 2018-12-31 DIAGNOSIS — J45909 Unspecified asthma, uncomplicated: Secondary | ICD-10-CM | POA: Diagnosis not present

## 2018-12-31 DIAGNOSIS — J019 Acute sinusitis, unspecified: Secondary | ICD-10-CM | POA: Diagnosis not present

## 2018-12-31 DIAGNOSIS — I10 Essential (primary) hypertension: Secondary | ICD-10-CM | POA: Diagnosis not present

## 2018-12-31 DIAGNOSIS — K623 Rectal prolapse: Secondary | ICD-10-CM | POA: Diagnosis not present

## 2018-12-31 DIAGNOSIS — K219 Gastro-esophageal reflux disease without esophagitis: Secondary | ICD-10-CM | POA: Diagnosis not present

## 2019-01-21 DIAGNOSIS — J019 Acute sinusitis, unspecified: Secondary | ICD-10-CM | POA: Diagnosis not present

## 2019-01-21 DIAGNOSIS — R05 Cough: Secondary | ICD-10-CM | POA: Diagnosis not present

## 2019-01-21 DIAGNOSIS — K219 Gastro-esophageal reflux disease without esophagitis: Secondary | ICD-10-CM | POA: Diagnosis not present

## 2019-01-21 DIAGNOSIS — J45909 Unspecified asthma, uncomplicated: Secondary | ICD-10-CM | POA: Diagnosis not present

## 2019-01-21 DIAGNOSIS — I10 Essential (primary) hypertension: Secondary | ICD-10-CM | POA: Diagnosis not present

## 2019-02-04 DIAGNOSIS — J45909 Unspecified asthma, uncomplicated: Secondary | ICD-10-CM | POA: Diagnosis not present

## 2019-02-04 DIAGNOSIS — J019 Acute sinusitis, unspecified: Secondary | ICD-10-CM | POA: Diagnosis not present

## 2019-02-04 DIAGNOSIS — I10 Essential (primary) hypertension: Secondary | ICD-10-CM | POA: Diagnosis not present

## 2019-02-04 DIAGNOSIS — K219 Gastro-esophageal reflux disease without esophagitis: Secondary | ICD-10-CM | POA: Diagnosis not present

## 2019-02-08 ENCOUNTER — Other Ambulatory Visit: Payer: Self-pay | Admitting: Family Medicine

## 2019-02-08 DIAGNOSIS — E2839 Other primary ovarian failure: Secondary | ICD-10-CM

## 2019-02-18 DIAGNOSIS — I10 Essential (primary) hypertension: Secondary | ICD-10-CM | POA: Diagnosis not present

## 2019-02-18 DIAGNOSIS — K219 Gastro-esophageal reflux disease without esophagitis: Secondary | ICD-10-CM | POA: Diagnosis not present

## 2019-02-18 DIAGNOSIS — J019 Acute sinusitis, unspecified: Secondary | ICD-10-CM | POA: Diagnosis not present

## 2019-02-18 DIAGNOSIS — J45909 Unspecified asthma, uncomplicated: Secondary | ICD-10-CM | POA: Diagnosis not present

## 2019-02-28 ENCOUNTER — Other Ambulatory Visit: Payer: Self-pay | Admitting: Physician Assistant

## 2019-02-28 DIAGNOSIS — D229 Melanocytic nevi, unspecified: Secondary | ICD-10-CM | POA: Diagnosis not present

## 2019-02-28 DIAGNOSIS — L821 Other seborrheic keratosis: Secondary | ICD-10-CM | POA: Diagnosis not present

## 2019-02-28 DIAGNOSIS — D485 Neoplasm of uncertain behavior of skin: Secondary | ICD-10-CM | POA: Diagnosis not present

## 2019-05-14 ENCOUNTER — Other Ambulatory Visit: Payer: PPO

## 2019-05-14 ENCOUNTER — Ambulatory Visit: Payer: PPO

## 2019-05-22 ENCOUNTER — Other Ambulatory Visit: Payer: Self-pay

## 2019-05-22 DIAGNOSIS — J45909 Unspecified asthma, uncomplicated: Secondary | ICD-10-CM | POA: Diagnosis not present

## 2019-05-22 DIAGNOSIS — J019 Acute sinusitis, unspecified: Secondary | ICD-10-CM | POA: Diagnosis not present

## 2019-05-22 DIAGNOSIS — I10 Essential (primary) hypertension: Secondary | ICD-10-CM | POA: Diagnosis not present

## 2019-05-22 DIAGNOSIS — R509 Fever, unspecified: Secondary | ICD-10-CM | POA: Diagnosis not present

## 2019-05-22 DIAGNOSIS — R05 Cough: Secondary | ICD-10-CM | POA: Diagnosis not present

## 2019-05-22 NOTE — Patient Outreach (Signed)
[facility]) Care Management  05/22/2019  Cheryl Kent Jul 09, 1946 [id number]   Telephone Screen  Referral Date: 05/22/2019 Referral Source:Nurse Call Center Referral Reason: " 05/21/2019-7:37pm-caller states she has sore throat and wants to know if she should be tested for COVID" Insurance: HTA   Outreach attempt #1  to patient. No answer. RN CM left HIPAA compliant voicemail message along with contact info.     Plan: RN CM will make outreach attempt to patient within 3-4 business days. RN CM will send unsuccessful outreach letter to patient.    Enzo Montgomery, RN,BSN,CCM [place] [facility] Telephonic Care Management Coordinator Direct Phone: [phone] Toll Free: [phone] Fax: [phone]

## 2019-05-22 NOTE — Patient Outreach (Signed)
[facility]) Care Management  05/22/2019  Cheryl Kent March 03, 1946 [id number]   Telephone Screen  Referral Date: 05/22/2019 Referral Source:Nurse Call Center Referral Reason: " 05/21/2019-7:37pm-caller states she has sore throat and wants to know if she should be tested for COVID" Insurance: HTA  Incoming call from patient. Spoke with patient who voices that's he took advice from nurse she spoke with at nurse line. She called PCP office this morning and was able to get face to face visit this morning. She vices that MD tested her for COVID-19 and she should have results within the next few days. Patient anxious and nervous abut possibly having virus as she states she has been extra cautious and a "germaphobe" during this time. She lives alone and voices only having gone out for essential items. She states tat she feels a little better today. Her temp at MD office was 99.1. Patient has not taken anything for low grade temp but states she has something on hand if fever rises.. RN CM reviewed with patient s/s of coronavirus and when to seek medical attention. Also, discussed the importance of self-quarantine if results are positive.She voiced understanding. Patient advised to feel free to call 24hr Nurse Line for any future needs or concerns. She  voiced understanding and was appreciative of follow up call.     Plan: RN CM will follow up with patient within 3-4 business days to assess condition.   Enzo Montgomery, RN,BSN,CCM [place] [facility] Telephonic Care Management Coordinator Direct Phone: [phone] Toll Free: [phone] Fax: [phone]

## 2019-05-30 DIAGNOSIS — D510 Vitamin B12 deficiency anemia due to intrinsic factor deficiency: Secondary | ICD-10-CM | POA: Diagnosis not present

## 2019-05-30 DIAGNOSIS — K623 Rectal prolapse: Secondary | ICD-10-CM | POA: Diagnosis not present

## 2019-05-30 DIAGNOSIS — E785 Hyperlipidemia, unspecified: Secondary | ICD-10-CM | POA: Diagnosis not present

## 2019-05-30 DIAGNOSIS — J45909 Unspecified asthma, uncomplicated: Secondary | ICD-10-CM | POA: Diagnosis not present

## 2019-05-30 DIAGNOSIS — I10 Essential (primary) hypertension: Secondary | ICD-10-CM | POA: Diagnosis not present

## 2019-05-30 DIAGNOSIS — R7309 Other abnormal glucose: Secondary | ICD-10-CM | POA: Diagnosis not present

## 2019-05-30 DIAGNOSIS — E559 Vitamin D deficiency, unspecified: Secondary | ICD-10-CM | POA: Diagnosis not present

## 2019-06-03 ENCOUNTER — Other Ambulatory Visit: Payer: Self-pay

## 2019-06-03 NOTE — Patient Outreach (Signed)
[facility]) Care Management  06/03/2019  Cheryl Kent [birth year] [id number]   Follow Up Call    RN CM made outreach call to patient to follow up on her test results and symptoms. Patient pleased to report that her COVID-19 results were negative. She reports that her sore throat and other symptoms have since resolved and she is back to normal. She denies any needs or concerns at this time. She is aware that she can call 24hr Nurse Line for any future needs or concerns.    Plan: RN CM will close case as no further interventions needed at this time.    Enzo Montgomery, RN,BSN,CCM [place] [facility] Telephonic Care Management Coordinator Direct Phone: [phone] Toll Free: [phone] Fax: [phone]

## 2019-07-03 ENCOUNTER — Ambulatory Visit
Admission: RE | Admit: 2019-07-03 | Discharge: 2019-07-03 | Disposition: A | Discharge status: Home / Self Care | Payer: PPO | Source: Ambulatory Visit | Attending: Family Medicine | Admitting: Family Medicine

## 2019-07-03 ENCOUNTER — Other Ambulatory Visit: Payer: Self-pay | Admitting: Family Medicine

## 2019-07-03 DIAGNOSIS — Z780 Asymptomatic menopausal state: Secondary | ICD-10-CM | POA: Diagnosis not present

## 2019-07-03 DIAGNOSIS — M8589 Other specified disorders of bone density and structure, multiple sites: Secondary | ICD-10-CM | POA: Diagnosis not present

## 2019-07-03 DIAGNOSIS — Z1231 Encounter for screening mammogram for malignant neoplasm of breast: Secondary | ICD-10-CM

## 2019-07-03 DIAGNOSIS — E2839 Other primary ovarian failure: Secondary | ICD-10-CM

## 2019-08-23 DIAGNOSIS — R159 Full incontinence of feces: Secondary | ICD-10-CM | POA: Diagnosis not present

## 2019-08-23 DIAGNOSIS — K623 Rectal prolapse: Secondary | ICD-10-CM | POA: Diagnosis not present

## 2019-09-17 IMAGING — MG DIGITAL SCREENING BILATERAL MAMMOGRAM WITH TOMO AND CAD
8 series · 8 of 24 positions shown · non-contrast
Comparison: Previous exam(s).

CLINICAL DATA: Screening.

EXAM:
DIGITAL SCREENING BILATERAL MAMMOGRAM WITH TOMO AND CAD

[L CC synth-2D]
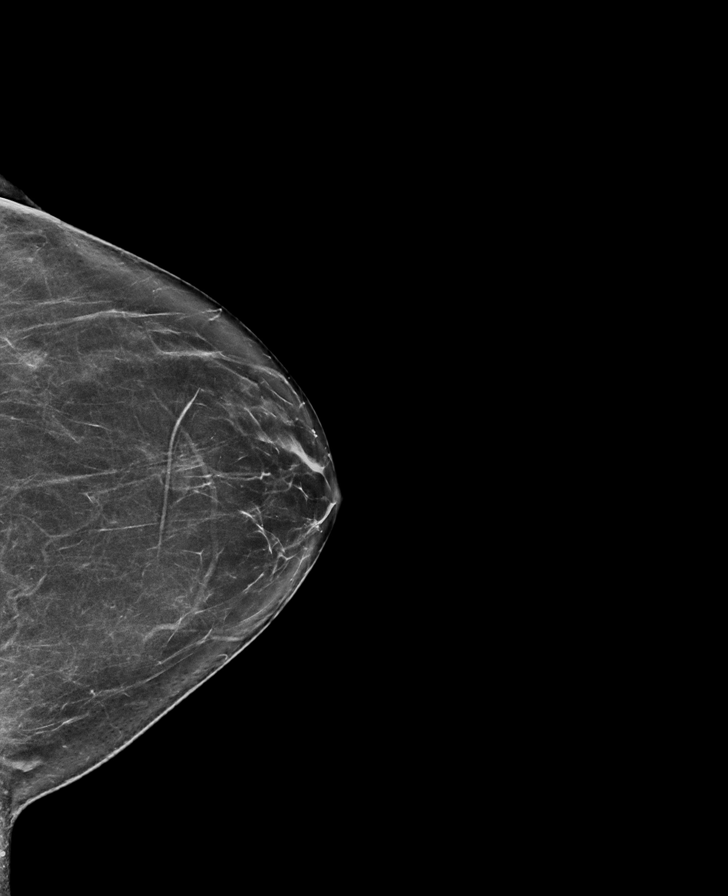

[R MLO synth-2D]
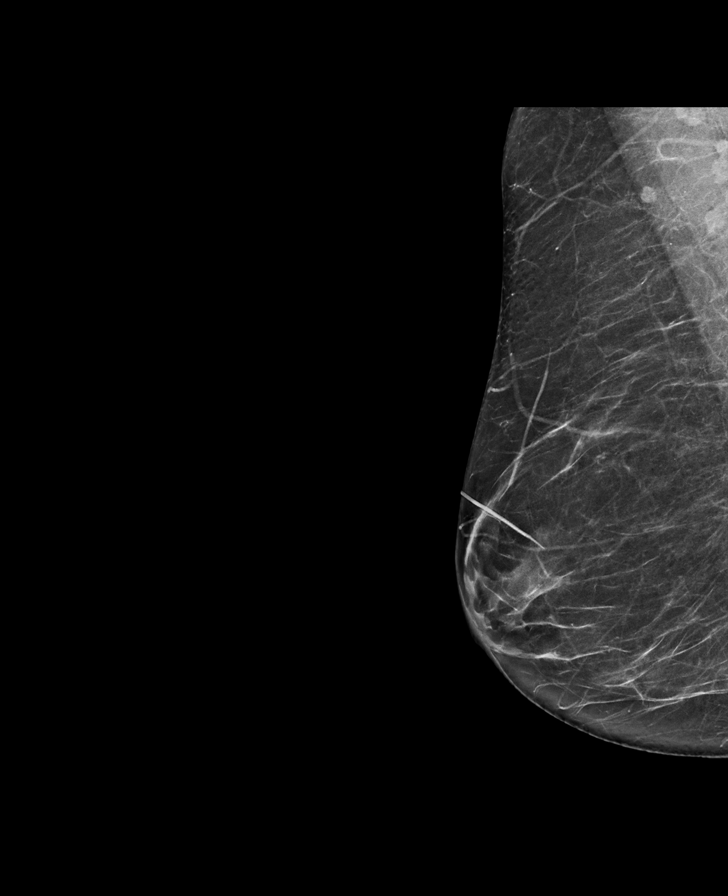

[L MLO synth-2D]
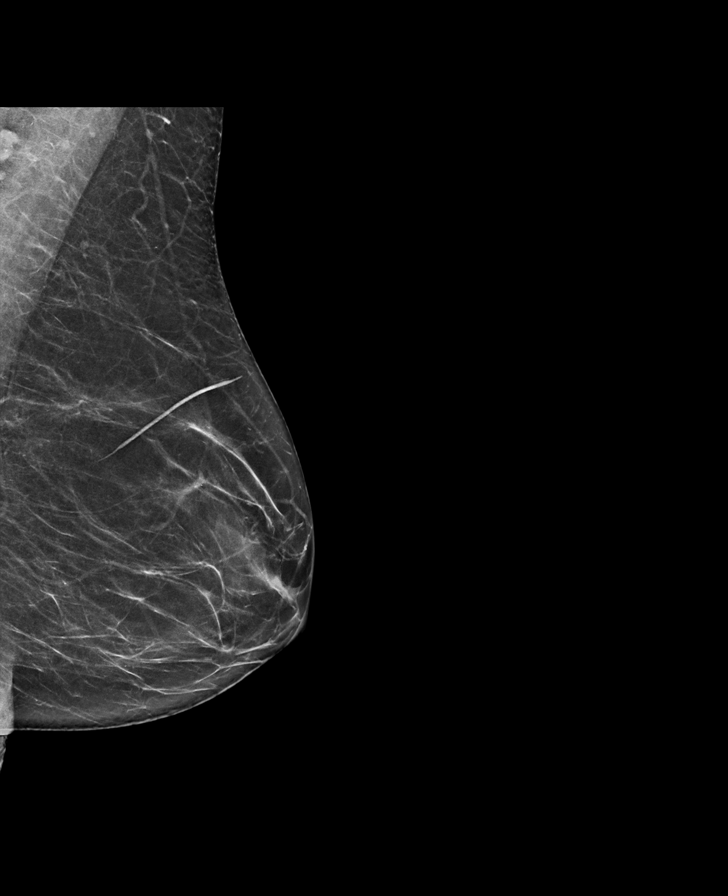

[R CC synth-2D]
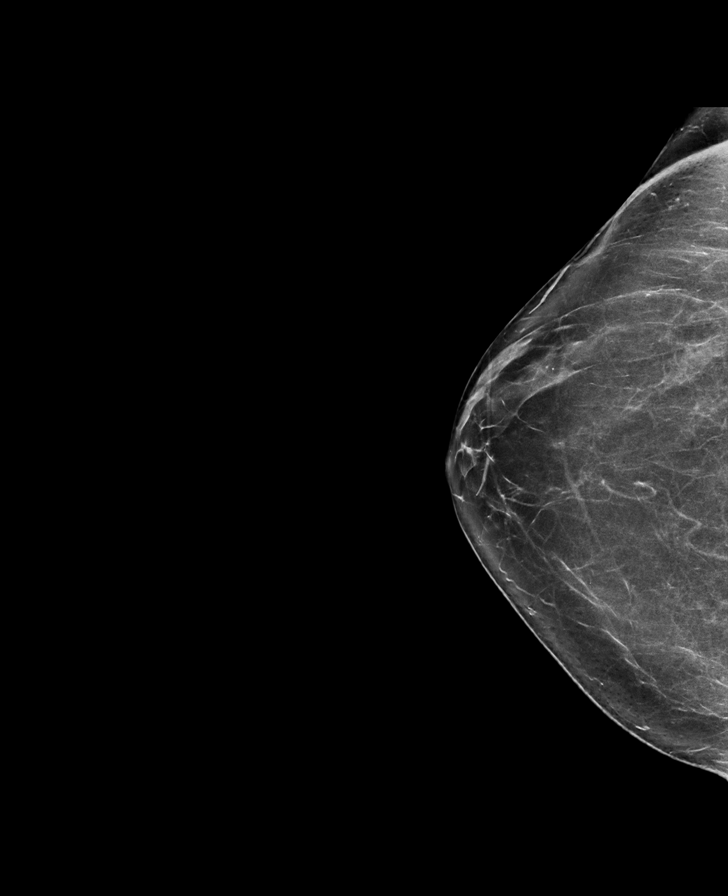

[R CC tomo · tomo slice 37/74.0]
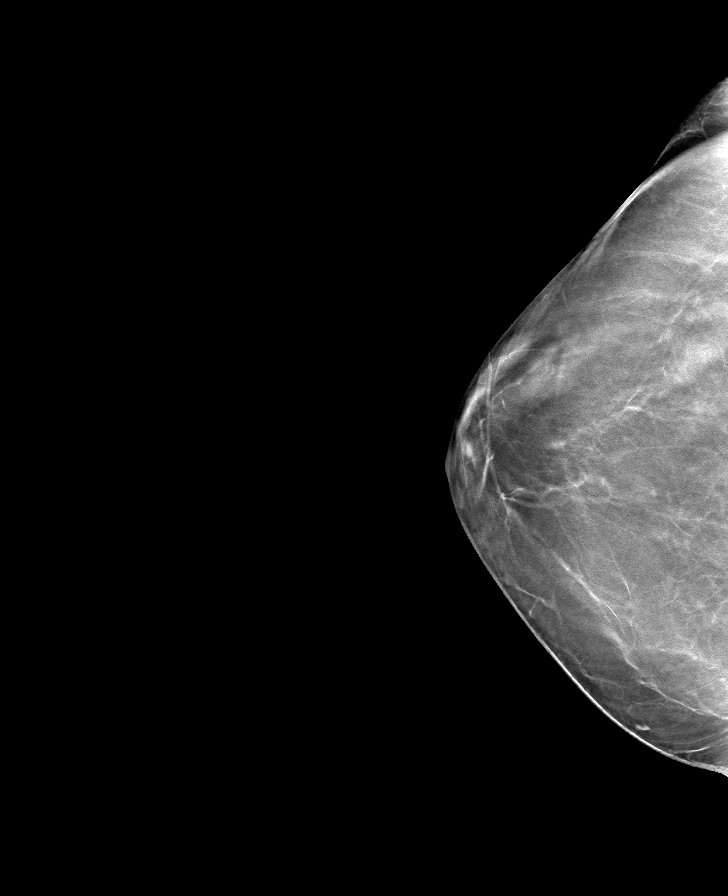

[R MLO tomo · tomo slice 34/67.0]
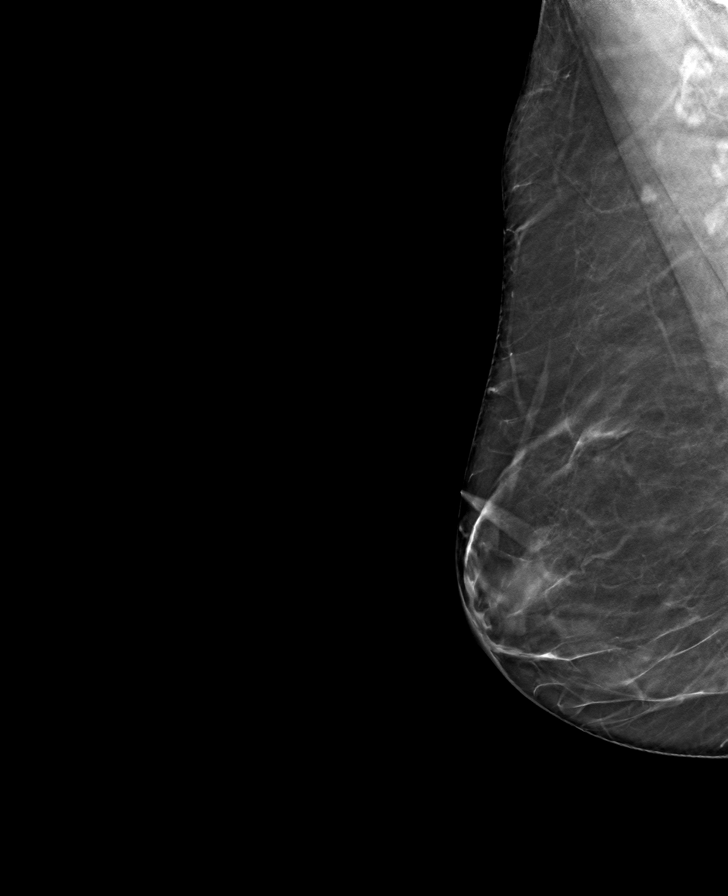

[L CC tomo · tomo slice 37/72.0]
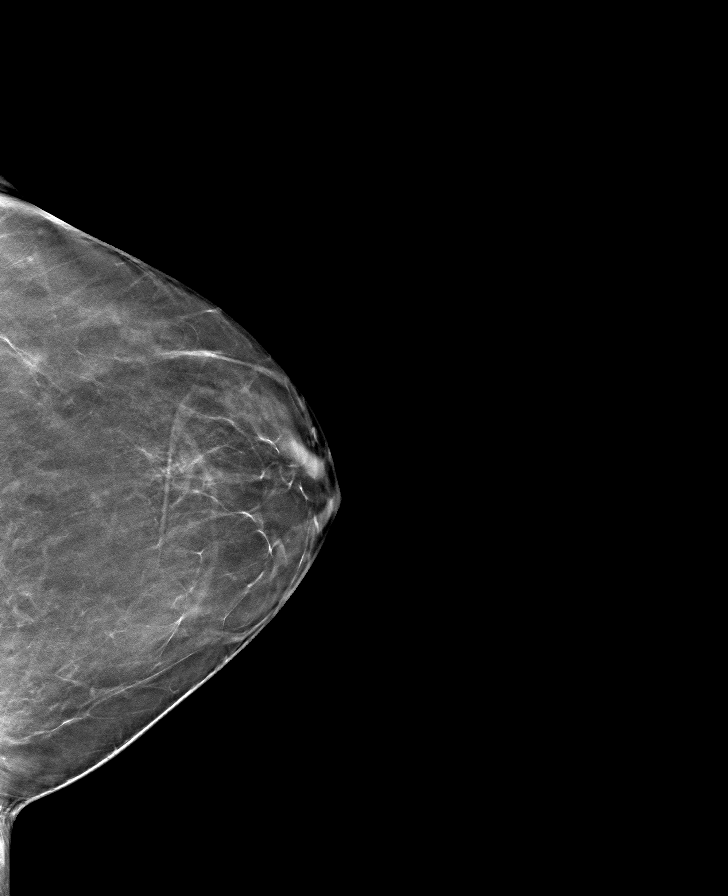

[L MLO tomo · tomo slice 33/65.0]
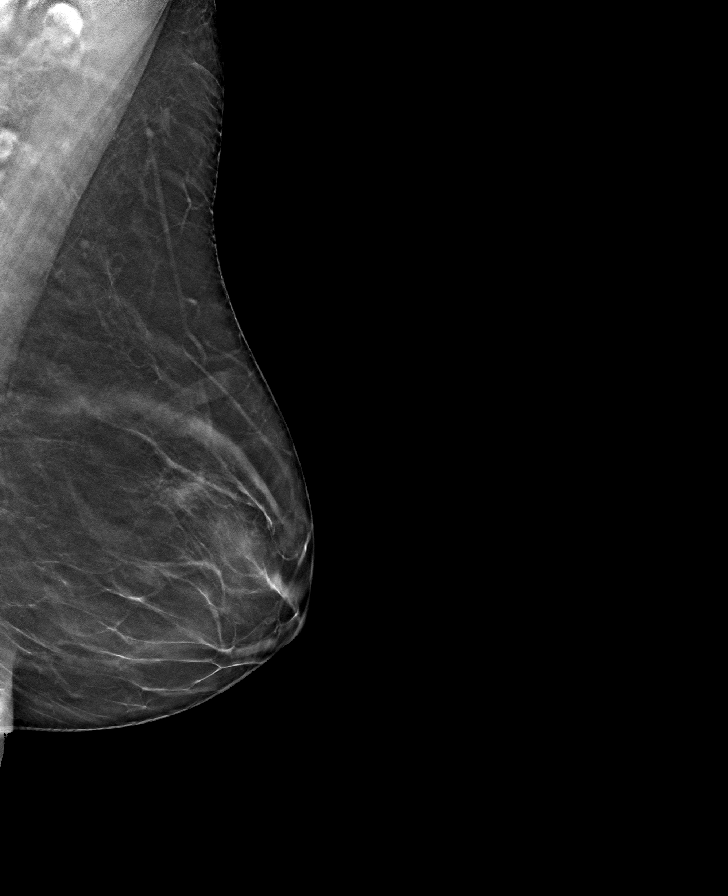

[8 of 24 positions shown; findings below may reference images not displayed]

ACR Breast Density Category b: There are scattered areas of
fibroglandular density.
FINDINGS: There are no findings suspicious for malignancy. Images were
processed with CAD.
IMPRESSION: No mammographic evidence of malignancy. A result letter of this
screening mammogram will be mailed directly to the patient.

RECOMMENDATION:
Screening mammogram in one year. (Code:[id number])

BI-RADS CATEGORY  1: Negative.

## 2019-09-30 DIAGNOSIS — Z961 Presence of intraocular lens: Secondary | ICD-10-CM | POA: Diagnosis not present

## 2019-09-30 DIAGNOSIS — H40013 Open angle with borderline findings, low risk, bilateral: Secondary | ICD-10-CM | POA: Diagnosis not present

## 2019-09-30 DIAGNOSIS — H35363 Drusen (degenerative) of macula, bilateral: Secondary | ICD-10-CM | POA: Diagnosis not present

## 2019-09-30 DIAGNOSIS — H04123 Dry eye syndrome of bilateral lacrimal glands: Secondary | ICD-10-CM | POA: Diagnosis not present

## 2020-01-05 ENCOUNTER — Ambulatory Visit: Payer: PPO | Attending: Internal Medicine

## 2020-01-05 DIAGNOSIS — Z23 Encounter for immunization: Secondary | ICD-10-CM | POA: Insufficient documentation

## 2020-01-05 NOTE — Progress Notes (Signed)
   Covid-19 Vaccination Clinic  Name:  Cheryl Kent    MRN: [id number] DOB: [birth year]  01/05/2020  Cheryl Kent was observed post Covid-19 immunization for 15 mins without incidence. She was provided with Vaccine Information Sheet and instruction to access the V-Safe system.   Cheryl Kent was instructed to call 911 with any severe reactions post vaccine: Marland Kitchen Difficulty breathing  . Swelling of your face and throat  . A fast heartbeat  . A bad rash all over your body  . Dizziness and weakness

## 2020-01-23 ENCOUNTER — Ambulatory Visit: Payer: PPO | Attending: Internal Medicine

## 2020-01-23 DIAGNOSIS — Z23 Encounter for immunization: Secondary | ICD-10-CM

## 2020-01-23 NOTE — Progress Notes (Signed)
   Covid-19 Vaccination Clinic  Name:  Cheryl Kent    MRN: [id number] DOB: [birth year]  01/23/2020  [name] was observed post Covid-19 immunization for 30 minutes based on pre-vaccination screening without incidence. She was provided with Vaccine Information Sheet and instruction to access the V-Safe system.   [name] was instructed to call 911 with any severe reactions post vaccine: Marland Kitchen Difficulty breathing  . Swelling of your face and throat  . A fast heartbeat  . A bad rash all over your body  . Dizziness and weakness    Immunizations Administered    Name Date Dose VIS Date Route   Pfizer COVID-19 Vaccine 01/23/2020  2:30 PM 0.3 mL 11/29/2019 Intramuscular   Manufacturer: [place]   Lot: [id number]   [place]: [id number]

## 2020-03-11 DIAGNOSIS — E785 Hyperlipidemia, unspecified: Secondary | ICD-10-CM | POA: Diagnosis not present

## 2020-03-11 DIAGNOSIS — K623 Rectal prolapse: Secondary | ICD-10-CM | POA: Diagnosis not present

## 2020-03-11 DIAGNOSIS — J45909 Unspecified asthma, uncomplicated: Secondary | ICD-10-CM | POA: Diagnosis not present

## 2020-03-11 DIAGNOSIS — K219 Gastro-esophageal reflux disease without esophagitis: Secondary | ICD-10-CM | POA: Diagnosis not present

## 2020-03-11 DIAGNOSIS — I10 Essential (primary) hypertension: Secondary | ICD-10-CM | POA: Diagnosis not present

## 2020-03-11 DIAGNOSIS — Z20822 Contact with and (suspected) exposure to covid-19: Secondary | ICD-10-CM | POA: Diagnosis not present

## 2020-03-13 DIAGNOSIS — I10 Essential (primary) hypertension: Secondary | ICD-10-CM | POA: Diagnosis not present

## 2020-03-13 DIAGNOSIS — R7301 Impaired fasting glucose: Secondary | ICD-10-CM | POA: Diagnosis not present

## 2020-03-13 DIAGNOSIS — E785 Hyperlipidemia, unspecified: Secondary | ICD-10-CM | POA: Diagnosis not present

## 2020-03-13 DIAGNOSIS — K623 Rectal prolapse: Secondary | ICD-10-CM | POA: Diagnosis not present

## 2020-03-13 DIAGNOSIS — D510 Vitamin B12 deficiency anemia due to intrinsic factor deficiency: Secondary | ICD-10-CM | POA: Diagnosis not present

## 2020-03-13 DIAGNOSIS — J45909 Unspecified asthma, uncomplicated: Secondary | ICD-10-CM | POA: Diagnosis not present

## 2020-03-13 DIAGNOSIS — E559 Vitamin D deficiency, unspecified: Secondary | ICD-10-CM | POA: Diagnosis not present

## 2020-04-09 DIAGNOSIS — H04123 Dry eye syndrome of bilateral lacrimal glands: Secondary | ICD-10-CM | POA: Diagnosis not present

## 2020-04-09 DIAGNOSIS — H40013 Open angle with borderline findings, low risk, bilateral: Secondary | ICD-10-CM | POA: Diagnosis not present

## 2020-04-23 DIAGNOSIS — I10 Essential (primary) hypertension: Secondary | ICD-10-CM | POA: Diagnosis not present

## 2020-04-23 DIAGNOSIS — K623 Rectal prolapse: Secondary | ICD-10-CM | POA: Diagnosis not present

## 2020-04-23 DIAGNOSIS — Z20822 Contact with and (suspected) exposure to covid-19: Secondary | ICD-10-CM | POA: Diagnosis not present

## 2020-04-23 DIAGNOSIS — K219 Gastro-esophageal reflux disease without esophagitis: Secondary | ICD-10-CM | POA: Diagnosis not present

## 2020-04-23 DIAGNOSIS — J45909 Unspecified asthma, uncomplicated: Secondary | ICD-10-CM | POA: Diagnosis not present

## 2020-05-21 ENCOUNTER — Other Ambulatory Visit (INDEPENDENT_AMBULATORY_CARE_PROVIDER_SITE_OTHER): Payer: PPO

## 2020-05-21 ENCOUNTER — Ambulatory Visit: Payer: PPO | Admitting: Gastroenterology

## 2020-05-21 ENCOUNTER — Encounter: Payer: Self-pay | Admitting: Gastroenterology

## 2020-05-21 VITALS — BP 118/78 | HR 88 | Ht 61.0 in | Wt 135.0 lb

## 2020-05-21 DIAGNOSIS — K623 Rectal prolapse: Secondary | ICD-10-CM

## 2020-05-21 DIAGNOSIS — K219 Gastro-esophageal reflux disease without esophagitis: Secondary | ICD-10-CM | POA: Diagnosis not present

## 2020-05-21 DIAGNOSIS — K529 Noninfective gastroenteritis and colitis, unspecified: Secondary | ICD-10-CM

## 2020-05-21 LAB — IGA: IgA: 132 mg/dL (ref 68–378)

## 2020-05-21 MED ORDER — SUTAB 1479-225-188 MG PO TABS: 1.0000 | ORAL_TABLET | Freq: Once | ORAL | 0 refills | Status: AC

## 2020-05-21 NOTE — Progress Notes (Signed)
HPI :  74 year old female with a history of rectal prolapse, GERD, osteopenia, referred by Horald Pollen MD for chronic diarrhea and reflux.  The patient states she has longstanding history of constipation chronically which led to rectal prolapse.  She has rectal prolapse with most of her bowel movements.  She has seen Dr. Leighton Ruff - colorectal surgery [facility] - for this 10 years ago and tried conservative measures which did not help her.  She has had another opinion by Dr. Drue Flirt at [facility] for this, and is considering surgical treatment for this.  For the past 6 months however she is had a clear change from baseline constipation to loose stools.  She has anywhere from 5-6 bowel movements a day when it is at its worst.  She has some days at this frequency, usually multiple days per week like this, some days not as frequent.  She denies any blood in the stool, does have some on the toilet paper.  She has urgency with this and some fecal incontinence.  She denies any abdominal pains.  She has no family history of colon cancer, celiac disease, or Crohn's disease.  She states her stools sometimes float on water.  She has had rare nocturnal symptoms.  She was given an empiric trial of Creon for 3 weeks which she took daily and did not have any benefit.  She was given a trial of some cholestyramine powder which she states made her actually quite constipated so she stopped it.  She has not tried any Imodium.  Her last colonoscopy reportedly was in 2013 with Dr. Collene Mares.  She states the initial exam was a poor prep so she had it repeated, again it was in an adequate prep.  She has been using stool based testing for colon cancer screening, most recently had a negative Cologuard in 2019.  Of note she has been taking a magnesium supplement.  She has tried stopping this in the past with no benefit to her symptoms.  She denies any new medications.  She has longstanding reflux symptoms and states she has a history  of hiatal hernia.  She takes omeprazole 20 mg once daily and that typically controls her symptoms pretty well.  She is try to come off it but has some recurrence of symptoms.  She denies any dysphagia.  No nausea, no vomiting, no postprandial symptoms.  She has a prior history of tobacco use but no longer smokes.  She never has had an EGD.  She does have osteopenia.  She inquires about long-term management of her reflux.  Labs: 05/14/20 B12 975 Folate normal WBC 9.5, Hgb 13.3, plt 309, MCV 101.8 AP 67, ALT 21, AST 28, T bil 0.5 BUN 13, Cr 0.79 TSH 1.98   Past Medical History:  Diagnosis Date  . Allergy    previous allergy shots.    . Anxiety   . Arthritis    DDD lumbar spine, hands B  . Cataract   . Fecal incontinence   . GERD (gastroesophageal reflux disease)   . History of colon polyps   . Hyperlipemia   . Hypertension   . Osteoarthritis of knee   . 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  2012.  Marland Kitchen Rectal prolapse   . Rosacea conjunctivitis   . Squamous cell carcinoma, face 07/05/2016   right forehead - CX3 + cautery + 5FU  . Substance abuse ([place])    alcohol  . Tuberculosis 2012  Past Surgical History:  Procedure Laterality Date  . BREAST EXCISIONAL BIOPSY Right    benign x 2  . BREAST EXCISIONAL BIOPSY Left    benign  . BREAST SURGERY     #1 age 39 yo  #2 age 60's  . EYE SURGERY Left 10/07/2014   Cataract removal- Dr. Herbert Deaner  . EYE SURGERY Right 11/18/2014   Cataract removal- Dr. Herbert Deaner  . FOOT SURGERY    . HAND SURGERY    . SHOULDER SURGERY    . SKIN BIOPSY Right 07/05/2016   right forehead  . TONSILLECTOMY AND ADENOIDECTOMY     childhood  . TUBAL LIGATION     Family History  Problem Relation Age of Onset  . Stroke Mother 84       multiple CVAs  . Hypertension Mother   . Hyperlipidemia Mother   . Osteoporosis Mother   . Mental illness Mother        paranoid schizophrenia  . Alzheimer's disease Father   .  Diabetes Father   . Hypertension Father   . Cancer Father        prostate  . Osteoporosis Sister   . Kidney disease Sister   . Stroke Sister   . Hyperlipidemia Sister   . Hypertension Sister   . Mental illness Sister        depression; multiple behavior health admissions  . Cancer Sister        leukemia  . Osteoporosis Maternal Grandmother   . Stroke Maternal Grandmother   . Osteoporosis Paternal Grandmother   . Alzheimer's disease Paternal Grandfather   . Cancer Cousin        MATERNAL IST COUSIN - BREAST CA   Social History   Tobacco Use  . Smoking status: Former Smoker    Packs/day: 1.50    Years: 25.00    Pack years: 37.50    Types: Cigarettes    Quit date: 12/20/1983    Years since quitting: 36.4  . Smokeless tobacco: Never Used  Substance Use Topics  . Alcohol use: No    Alcohol/week: 0.0 standard drinks    Comment: former heavy use/alcoholism; quit in 1980 with treatment  . Drug use: No    Comment: former abuse of prescription drugs during 1970s.   Current Outpatient Medications  Medication Sig Dispense Refill  . Cholecalciferol (VITAMIN D) 2000 UNITS CAPS Take by mouth daily.    . Coenzyme Q10 (COQ10) 100 MG CAPS Take by mouth daily.    Marland Kitchen doxylamine, Sleep, (UNISOM) 25 MG tablet Take 25 mg by mouth at bedtime as needed.    . Lactobacillus (DIGESTIVE HEALTH PROBIOTIC PO) Take by mouth. Digestive Advantage 1 gummie chewed daily.    Marland Kitchen lisinopril-hydrochlorothiazide (PRINZIDE,ZESTORETIC) 20-25 MG tablet Take 1 tablet by mouth daily. 90 tablet 3  . Magnesium 300 MG CAPS Take by mouth daily.    . Multiple Vitamin (MULTIVITAMIN) tablet Take 1 tablet by mouth daily.    . polyethylene glycol powder (GLYCOLAX/MIRALAX) powder Take 17 g by mouth daily. Use as needed for constipation 850 g 3  . rosuvastatin (CRESTOR) 10 MG tablet Take 1 tablet (10 mg total) by mouth daily. 90 tablet 3  . traMADol (ULTRAM) 50 MG tablet Take 1 tablet (50 mg total) by mouth every 6 (six) hours  as needed. TAKE 1 TABLE BY MOUTH EVERY 12 HOURS AS NEEDED 30 tablet 0  . traZODone (DESYREL) 150 MG tablet Take 1 tablet (150 mg total) by mouth at bedtime. 90 tablet  3   No current facility-administered medications for this visit.   Allergies  Allergen Reactions  . Penicillins Other (See Comments)    infant  . Pneumococcal Vaccines   . Sulfamethoxazole-Trimethoprim   . Sulfonamide Derivatives      Review of Systems: All systems reviewed and negative except where noted in HPI.    Labs per HPI  Physical Exam: BP 118/78   Pulse 88   Ht 5\' 1"  (1.549 m)   Wt 135 lb (61.2 kg)   SpO2 96%   BMI 25.51 kg/m  Constitutional: Pleasant,well-developed, female in no acute distress. HEENT: Normocephalic and atraumatic. Conjunctivae are normal. No scleral icterus. Neck supple.  Cardiovascular: Normal rate, regular rhythm.  Pulmonary/chest: Effort normal and breath sounds normal. No wheezing, rales or rhonchi. Abdominal: Soft, nondistended, nontender. There are no masses palpable.  Extremities: no edema Lymphadenopathy: No cervical adenopathy noted. Neurological: Alert and oriented to person place and time. Skin: Skin is warm and dry. No rashes noted. Psychiatric: Normal mood and affect. Behavior is normal.   ASSESSMENT AND PLAN: 74 year old female here for new patient assessment of the following:  Chronic diarrhea / rectal prolapse - longstanding constipation led to rectal prolapse for which conservative measures have not really helped.  She is considering surgery for this however has developed persistent loose stools over the past 6 months with occasional incontinence and increased frequency.  Discussed differential diagnosis with her.  I will check her fecal pancreatic elastase to ensure normal given description of her stools.  I will also screen her for celiac disease with TTG and IgA level.  If these are normal I am recommending a colonoscopy to ensure no evidence of colitis and to  rule out microscopic colitis.  I discussed risks and benefits of colonoscopy and anesthesia with her and she wanted to proceed.  Hopefully her bowel prep is better this time around given she does not have any constipation (last colonoscopy was limited by prep).  Once this issue has been resolved she plans on following up with surgery to discuss repair of rectal prolapse.  Of note, if stool studies normal, we will plan on trying her on low-dose Colestid to see if that will help reduce stool frequency as she did respond well to cholestyramine however it was too strong for her and led to constipation.  GERD - counseled on long-term reflux treatment options.  She is doing quite well on omeprazole 20 mg a day however has had a hard time coming off of it without recurrent symptoms.  Discussed long-term chronic PPI use with her.  She does have osteopenia and is at higher risk for fracture, while her renal function is normal.  Given she responds well to omeprazole without any alarm symptoms I do not feel strongly she warrants an endoscopy but we discussed it briefly. She is quite happy with the regimen and wants to continue it for now, moving forward could consider switching to Pepcid however I do not think this will likely control her symptoms  [place] Cellar, MD [facility]  CC: Hayden Rasmussen, MD

## 2020-05-21 NOTE — Patient Instructions (Addendum)
If you are age 76 or older, your body mass index should be between 23-30. Your Body mass index is 25.51 kg/m. If this is out of the aforementioned range listed, please consider follow up with your Primary Care Provider.  If you are age 35 or younger, your body mass index should be between 19-25. Your Body mass index is 25.51 kg/m. If this is out of the aformentioned range listed, please consider follow up with your Primary Care Provider.   You have been scheduled for a colonoscopy. Please follow written instructions given to you at your visit today.  Please pick up your prep supplies at the pharmacy within the next 1-3 days. If you use inhalers (even only as needed), please bring them with you on the day of your procedure.  Please go to the lab in the basement of our building to have lab work done as you leave today. Hit "B" for basement when you get on the elevator.  When the doors open the lab is on your left.  We will call you with the results. Thank you.  The SUTAB prep for your procedure should be $40 IF the pharmacy runs the codes we sent with the prescription.  If it is more than $40, please ask the pharmacy to [place] we sent with the prescription. Thank you.  Thank you for entrusting me with your care and for choosing [facility], Dr. [place] Cellar

## 2020-05-22 LAB — TISSUE TRANSGLUTAMINASE, IGA: (tTG) Ab, IgA: 1 U/mL

## 2020-05-26 ENCOUNTER — Other Ambulatory Visit: Payer: PPO

## 2020-05-26 DIAGNOSIS — K623 Rectal prolapse: Secondary | ICD-10-CM

## 2020-05-26 DIAGNOSIS — K219 Gastro-esophageal reflux disease without esophagitis: Secondary | ICD-10-CM | POA: Diagnosis not present

## 2020-05-26 DIAGNOSIS — K529 Noninfective gastroenteritis and colitis, unspecified: Secondary | ICD-10-CM | POA: Diagnosis not present

## 2020-06-01 LAB — PANCREATIC ELASTASE, FECAL: Pancreatic Elastase-1, Stool: >500 ug/g

## 2020-06-03 ENCOUNTER — Encounter: Payer: Self-pay | Admitting: Gastroenterology

## 2020-06-17 ENCOUNTER — Ambulatory Visit (AMBULATORY_SURGERY_CENTER): Payer: PPO | Admitting: Gastroenterology

## 2020-06-17 ENCOUNTER — Encounter: Payer: Self-pay | Admitting: Gastroenterology

## 2020-06-17 ENCOUNTER — Other Ambulatory Visit: Payer: Self-pay

## 2020-06-17 VITALS — BP 124/72 | HR 56 | Temp 98.6°F | Resp 12 | Ht 61.0 in | Wt 135.0 lb

## 2020-06-17 DIAGNOSIS — K623 Rectal prolapse: Secondary | ICD-10-CM

## 2020-06-17 DIAGNOSIS — D123 Benign neoplasm of transverse colon: Secondary | ICD-10-CM

## 2020-06-17 DIAGNOSIS — D124 Benign neoplasm of descending colon: Secondary | ICD-10-CM

## 2020-06-17 DIAGNOSIS — D120 Benign neoplasm of cecum: Secondary | ICD-10-CM

## 2020-06-17 DIAGNOSIS — R197 Diarrhea, unspecified: Secondary | ICD-10-CM | POA: Diagnosis not present

## 2020-06-17 DIAGNOSIS — D122 Benign neoplasm of ascending colon: Secondary | ICD-10-CM

## 2020-06-17 DIAGNOSIS — K529 Noninfective gastroenteritis and colitis, unspecified: Secondary | ICD-10-CM

## 2020-06-17 MED ORDER — SODIUM CHLORIDE 0.9 % IV SOLN: 500.0000 mL | Freq: Once | INTRAVENOUS | Status: DC

## 2020-06-17 NOTE — Op Note (Addendum)
[facility] Patient Name: Cheryl Kent Procedure Date: 06/17/2020 9:00 AM MRN: [id number] Endoscopist: Viviann Spare P. Adela Lank , MD Age: 74 Referring MD:  Date of Birth: June 20, 1946 Gender: Female Account #: [id number] Procedure:                Colonoscopy Indications:              Chronic diarrhea (history of constipation, now with                            chronic diarrhea), worsening rectal prolapse,                            negative for celiac testing / fecal pancreatic                            elastase. Patient endorses taking immodium prior to                            bowel prep Medicines:                Monitored Anesthesia Care Procedure:                Pre-Anesthesia Assessment:                           - Prior to the procedure, a History and Physical                            was performed, and patient medications and                            allergies were reviewed. The patient's tolerance of                            previous anesthesia was also reviewed. The risks                            and benefits of the procedure and the sedation                            options and risks were discussed with the patient.                            All questions were answered, and informed consent                            was obtained. Prior Anticoagulants: The patient has                            taken no previous anticoagulant or antiplatelet                            agents. ASA Grade Assessment: II - A patient with  mild systemic disease. After reviewing the risks                            and benefits, the patient was deemed in                            satisfactory condition to undergo the procedure.                           After obtaining informed consent, the colonoscope                            was passed under direct vision. Throughout the                            procedure, the patient's blood pressure, pulse, and                             oxygen saturations were monitored continuously. The                            Colonoscope was introduced through the anus and                            advanced to the the terminal ileum, with                            identification of the appendiceal orifice and IC                            valve. The colonoscopy was performed without                            difficulty. The patient tolerated the procedure                            well. The quality of the bowel preparation was                            fair. The terminal ileum, ileocecal valve,                            appendiceal orifice, and rectum were photographed. Scope In: 9:09:08 AM Scope Out: 9:38:31 AM Scope Withdrawal Time: 0 hours 22 minutes 21 seconds  Total Procedure Duration: 0 hours 29 minutes 23 seconds  Findings:                 The perianal and digital rectal examinations were                            normal.                           The terminal ileum appeared normal.  A diminutive polyp was found in the cecum. The                            polyp was sessile. The polyp was removed with a                            cold biopsy forceps. Resection and retrieval were                            complete.                           A 3 mm polyp was found in the ascending colon. The                            polyp was sessile. The polyp was removed with a                            cold snare. Resection and retrieval were complete.                           A 4 mm polyp was found in the transverse colon. The                            polyp was flat. The polyp was removed with a cold                            snare. Resection and retrieval were complete.                           Two flat and sessile polyps were found in the                            splenic flexure. The polyps were 5 to 6 mm in size.                            These polyps were removed  with a cold snare.                            Resection and retrieval were complete.                           A 3 mm polyp was found in the descending colon. The                            polyp was sessile. The polyp was removed with a                            cold snare. Resection and retrieval were complete.                           An area of erythematous mucosa  was found in the                            distal rectum consistent with changes related to                            rectal prolapse. Biopsies were taken with a cold                            forceps for histology.                           Multiple medium-mouthed diverticula were found in                            the entire colon.                           The bowel prep was fair. Multiple areas of residual                            stool in dependant portions of the colon, several                            minutes spent lavaging - no large or high risk                            polyps in these areas although small or flat                            lesions may not have been appreciated. The exam was                            otherwise without abnormality without inflammatory                            changes. Retroflexed views of the rectum not                            obtained due to difficulty retaining air and small                            size of the rectum.                           Biopsies for histology were taken with a cold                            forceps from the right colon, left colon and                            transverse colon for evaluation of microscopic  colitis. Complications:            No immediate complications. Estimated blood loss:                            Minimal. Estimated Blood Loss:     Estimated blood loss was minimal. Impression:               - Preparation of the colon was fair.                           - The examined portion of the ileum  was normal.                           - One diminutive polyp in the cecum, removed with a                            cold biopsy forceps. Resected and retrieved.                           - One 3 mm polyp in the ascending colon, removed                            with a cold snare. Resected and retrieved.                           - One 4 mm polyp in the transverse colon, removed                            with a cold snare. Resected and retrieved.                           - Two 5 to 6 mm polyps at the splenic flexure,                            removed with a cold snare. Resected and retrieved.                           - One 3 mm polyp in the descending colon, removed                            with a cold snare. Resected and retrieved.                           - Erythematous mucosa in the rectum, grossly                            consistent with prolapse changes. Biopsied.                           - Diverticulosis in the entire examined colon.                           - The examination was otherwise normal.                           -  Biopsies were taken with a cold forceps from the                            right colon, left colon and transverse colon for                            evaluation of microscopic colitis. Recommendation:           - Patient has a contact number available for                            emergencies. The signs and symptoms of potential                            delayed complications were discussed with the                            patient. Return to normal activities tomorrow.                            Written discharge instructions were provided to the                            patient.                           - Resume previous diet.                           - Continue present medications.                           - Await pathology results with further                            recommendations. Viviann Spare P. Jeslynn Hollander, MD 06/17/2020 9:46:25  AM This report has been signed electronically.

## 2020-06-17 NOTE — Progress Notes (Signed)
Vitals-CW ?

## 2020-06-17 NOTE — Progress Notes (Signed)
A and O x3. Report to RN. Tolerated MAC anesthesia well.

## 2020-06-17 NOTE — Progress Notes (Signed)
Called to room to assist during endoscopic procedure.  Patient ID and intended procedure confirmed with present staff. Received instructions for my participation in the procedure from the performing physician.  

## 2020-06-17 NOTE — Patient Instructions (Signed)
Please read handouts provided. Continue present medications. Await pathology results.   YOU HAD AN ENDOSCOPIC PROCEDURE TODAY AT THE [facility]:   Refer to the procedure report that was given to you for any specific questions about what was found during the examination.  If the procedure report does not answer your questions, please call your gastroenterologist to clarify.  If you requested that your care partner not be given the details of your procedure findings, then the procedure report has been included in a sealed envelope for you to review at your convenience later.  YOU SHOULD EXPECT: Some feelings of bloating in the abdomen. Passage of more gas than usual.  Walking can help get rid of the air that was put into your GI tract during the procedure and reduce the bloating. If you had a lower endoscopy (such as a colonoscopy or flexible sigmoidoscopy) you may notice spotting of blood in your stool or on the toilet paper. If you underwent a bowel prep for your procedure, you may not have a normal bowel movement for a few days.  Please Note:  You might notice some irritation and congestion in your nose or some drainage.  This is from the oxygen used during your procedure.  There is no need for concern and it should clear up in a day or so.  SYMPTOMS TO REPORT IMMEDIATELY:  Following lower endoscopy (colonoscopy or flexible sigmoidoscopy):  Excessive amounts of blood in the stool  Significant tenderness or worsening of abdominal pains  Swelling of the abdomen that is new, acute  Fever of 100F or higher   For urgent or emergent issues, a gastroenterologist can be reached at any hour by calling [phone]. Do not use MyChart messaging for urgent concerns.    DIET:  We do recommend a small meal at first, but then you may proceed to your regular diet.  Drink plenty of fluids but you should avoid alcoholic beverages for 24 hours.  ACTIVITY:  You should plan to take it easy  for the rest of today and you should NOT DRIVE or use heavy machinery until tomorrow (because of the sedation medicines used during the test).    FOLLOW UP: Our staff will call the number listed on your records 48-72 hours following your procedure to check on you and address any questions or concerns that you may have regarding the information given to you following your procedure. If we do not reach you, we will leave a message.  We will attempt to reach you two times.  During this call, we will ask if you have developed any symptoms of COVID 19. If you develop any symptoms (ie: fever, flu-like symptoms, shortness of breath, cough etc.) before then, please call [phone].  If you test positive for Covid 19 in the 2 weeks post procedure, please call and report this information to us.    If any biopsies were taken you will be contacted by phone or by letter within the next 1-3 weeks.  Please call us at [phone] if you have not heard about the biopsies in 3 weeks.    SIGNATURES/CONFIDENTIALITY: You and/or your care partner have signed paperwork which will be entered into your electronic medical record.  These signatures attest to the fact that that the information above on your After Visit Summary has been reviewed and is understood.  Full responsibility of the confidentiality of this discharge information lies with you and/or your care-partner.  

## 2020-06-19 ENCOUNTER — Telehealth: Payer: Self-pay | Admitting: *Deleted

## 2020-06-19 ENCOUNTER — Telehealth: Payer: Self-pay

## 2020-06-19 NOTE — Telephone Encounter (Signed)
First post procedure follow up call, no answer 

## 2020-06-19 NOTE — Telephone Encounter (Signed)
1. Have you developed a fever since your procedure? no  2.   Have you had an respiratory symptoms (SOB or cough) since your procedure? no  3.   Have you tested positive for COVID 19 since your procedure no  4.   Have you had any family members/close contacts diagnosed with the COVID 19 since your procedure?  no   If yes to any of these questions please route to Joylene John, RN and Erenest Rasher, RN   Follow up Call-  Call back number 06/17/2020  Post procedure Call Back phone  # [phone]  Permission to leave phone message Yes  Some recent data might be hidden     Patient questions:  Do you have a fever, pain , or abdominal swelling? No. Pain Score  0 *  Have you tolerated food without any problems? Yes.    Have you been able to return to your normal activities? Yes.    Do you have any questions about your discharge instructions: Diet   No. Medications  No. Follow up visit  No.  Do you have questions or concerns about your Care? No.  Actions: * If pain score is 4 or above: No action needed, pain <4.

## 2020-06-25 ENCOUNTER — Other Ambulatory Visit: Payer: Self-pay | Admitting: Family Medicine

## 2020-06-25 ENCOUNTER — Other Ambulatory Visit: Payer: Self-pay

## 2020-06-25 DIAGNOSIS — Z1231 Encounter for screening mammogram for malignant neoplasm of breast: Secondary | ICD-10-CM

## 2020-06-25 MED ORDER — COLESTIPOL HCL 1 G PO TABS: 1.0000 g | ORAL_TABLET | Freq: Two times a day (BID) | ORAL | 3 refills | Status: DC

## 2020-07-03 ENCOUNTER — Other Ambulatory Visit: Payer: Self-pay

## 2020-07-03 ENCOUNTER — Ambulatory Visit
Admission: RE | Admit: 2020-07-03 | Discharge: 2020-07-03 | Disposition: A | Discharge status: Home / Self Care | Payer: PPO | Source: Ambulatory Visit | Attending: Family Medicine | Admitting: Family Medicine

## 2020-07-03 DIAGNOSIS — Z1231 Encounter for screening mammogram for malignant neoplasm of breast: Secondary | ICD-10-CM | POA: Diagnosis not present

## 2020-07-13 DIAGNOSIS — Z0001 Encounter for general adult medical examination with abnormal findings: Secondary | ICD-10-CM | POA: Diagnosis not present

## 2020-07-13 DIAGNOSIS — D7589 Other specified diseases of blood and blood-forming organs: Secondary | ICD-10-CM | POA: Diagnosis not present

## 2020-07-13 DIAGNOSIS — E559 Vitamin D deficiency, unspecified: Secondary | ICD-10-CM | POA: Diagnosis not present

## 2020-07-13 DIAGNOSIS — K219 Gastro-esophageal reflux disease without esophagitis: Secondary | ICD-10-CM | POA: Diagnosis not present

## 2020-07-13 DIAGNOSIS — Z20822 Contact with and (suspected) exposure to covid-19: Secondary | ICD-10-CM | POA: Diagnosis not present

## 2020-07-13 DIAGNOSIS — I10 Essential (primary) hypertension: Secondary | ICD-10-CM | POA: Diagnosis not present

## 2020-07-13 DIAGNOSIS — J45909 Unspecified asthma, uncomplicated: Secondary | ICD-10-CM | POA: Diagnosis not present

## 2020-07-13 DIAGNOSIS — R7309 Other abnormal glucose: Secondary | ICD-10-CM | POA: Diagnosis not present

## 2020-07-13 DIAGNOSIS — K623 Rectal prolapse: Secondary | ICD-10-CM | POA: Diagnosis not present

## 2020-07-13 DIAGNOSIS — E785 Hyperlipidemia, unspecified: Secondary | ICD-10-CM | POA: Diagnosis not present

## 2020-07-13 DIAGNOSIS — K8689 Other specified diseases of pancreas: Secondary | ICD-10-CM | POA: Diagnosis not present

## 2020-09-15 ENCOUNTER — Ambulatory Visit: Payer: PPO | Admitting: Gastroenterology

## 2020-09-15 ENCOUNTER — Encounter: Payer: Self-pay | Admitting: Gastroenterology

## 2020-09-15 VITALS — BP 104/70 | HR 85 | Ht 60.0 in | Wt 134.2 lb

## 2020-09-15 DIAGNOSIS — Z86010 Personal history of colonic polyps: Secondary | ICD-10-CM

## 2020-09-15 DIAGNOSIS — K529 Noninfective gastroenteritis and colitis, unspecified: Secondary | ICD-10-CM

## 2020-09-15 DIAGNOSIS — K219 Gastro-esophageal reflux disease without esophagitis: Secondary | ICD-10-CM

## 2020-09-15 MED ORDER — LOPERAMIDE HCL 2 MG PO TABS
ORAL_TABLET | ORAL | 0 refills | Status: DC
Start: 2020-09-15 — End: 2021-09-15

## 2020-09-15 NOTE — Progress Notes (Signed)
HPI :  74 year old female here for follow-up for chronic loose stools and GERD.  Historically she has had longstanding constipation which led to rectal prolapse.  She has seen Dr. Alfonse Alpers as well as Dr. Drue Flirt at [facility] for this, has been contemplating surgery but then developed problems with diarrhea.  Since around the new year she has had a change of bowels from baseline constipation or loose stools.  Frequency has been variable.  We performed stool testing for fecal elastase which was negative.  Celiac serologies negative.  She underwent a colonoscopy at the end of June for this issue.  No overt inflammation, biopsies negative for microscopic colitis.  She incidentally was noted to have 6 small adenomas, her prep was fair at the time, she had accidentally taken Imodium prior to her bowel prep.  Previously she had been given a trial of cholestyramine, this caused constipation so she stopped it.  We had put her on some Colestid.  If she takes 2 tablets of Colestid a day she becomes constipated.  Alternatively she has tried 1 tablet a day and still has some occasional loose stools on the regimen.  Imodium has provided her benefit in the past but she has not been taking much of that.  She does take a daily magnesium supplement, she states she thinks she has been on that for a long time, in fact had previously held it for a period of time and she cannot recall if it helped or not.  She denies any new medications to be associated with the onset of her symptoms.  She does take omeprazole 20 mg a day for a long time.  She inquires about long-term regimen about this.  She does not have any significant breakthrough on this regimen at baseline, however if she eats prior to bedtime she can have some breakthrough.  She has tried Pepcid which did not provide benefit.  She has tried taking her omeprazole every other day and has not had good control of her symptoms.  She seems to require omeprazole every  day to control the symptoms.  She denies any dysphagia.  She has never had a prior EGD.  She has used tobacco for more than 25 years in the past as well as is a recovering alcoholic.  Her husband died of esophageal cancer and she is a bit anxious about this issue.  She does have osteopenia, and she is concerned about long-term risks of the medication.  Her renal function is normal.   Labs: 05/14/20 B12 975 Folate normal WBC 9.5, Hgb 13.3, plt 309, MCV 101.8 AP 67, ALT 21, AST 28, T bil 0.5 BUN 13, Cr 0.79 TSH 1.98   Colonoscopy 06/17/20 - Preparation of the colon was fair. - The examined portion of the ileum was normal. - One diminutive polyp in the cecum, removed with a cold biopsy forceps. Resected and retrieved. - One 3 mm polyp in the ascending colon, removed with a cold snare. Resected and retrieved. - One 4 mm polyp in the transverse colon, removed with a cold snare. Resected and retrieved. - Two 5 to 6 mm polyps at the splenic flexure, removed with a cold snare. Resected and retrieved. - One 3 mm polyp in the descending colon, removed with a cold snare. Resected and retrieved. - Erythematous mucosa in the rectum, grossly consistent with prolapse changes. Biopsied. - Diverticulosis in the entire examined colon. - The examination was otherwise normal. - Biopsies were taken with a cold  forceps, rule out microscopic colitis.  Diagnosis 1. Surgical [P], colon, cecum, ascending, transverse, splenic flexure, descending, polyp (6) - TUBULAR ADENOMA(S) WITHOUT HIGH-GRADE DYSPLASIA OR MALIGNANCY - SESSILE SERRATED POLYP(S) WITHOUT CYTOLOGIC DYSPLASIA 2. Surgical [P], colon, random sites - COLONIC MUCOSA WITH NO SPECIFIC HISTOPATHOLOGIC CHANGES - NEGATIVE FOR ACUTE INFLAMMATION, INCREASED INTRAEPITHELIAL LYMPHOCYTES OR THICKENED SUBEPITHELIAL COLLAGEN TABLE 3. Surgical [P], colon, rectum - RECTAL MUCOSA WITH REACTIVE CHANGES AND MUSCULARIZATION OF LAMINA PROPRIA, CONSISTENT  WITH POSSIBLE PROLAPSE       Past Medical History:  Diagnosis Date  . Acid reflux   . Alcoholism ([place])    per pt she has been sober for 21 years  . Allergy    previous allergy shots.    . Anxiety   . Arthritis    DDD lumbar spine, hands B  . Cataract   . Depression   . Fecal incontinence   . GERD (gastroesophageal reflux disease)   . Hemorrhoids   . History of colon polyps   . History of UTI   . Hyperlipemia   . Hypertension   . IBS (irritable bowel syndrome)   . Osteoarthritis of knee   . 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  2012.  Marland Kitchen Rectal prolapse   . Rosacea conjunctivitis   . Squamous cell carcinoma, face 07/05/2016   right forehead - CX3 + cautery + 5FU  . Substance abuse ([place])    alcohol  . Tuberculosis 2012     Past Surgical History:  Procedure Laterality Date  . BREAST EXCISIONAL BIOPSY Right    benign x 2  . BREAST EXCISIONAL BIOPSY Left    benign  . BREAST SURGERY     #1 age 41 yo  #2 age 17's  . EYE SURGERY Left 10/07/2014   Cataract removal- Dr. Herbert Deaner  . EYE SURGERY Right 11/18/2014   Cataract removal- Dr. Herbert Deaner  . FOOT SURGERY    . HAND SURGERY    . SHOULDER SURGERY    . SKIN BIOPSY Right 07/05/2016   right forehead  . TONSILLECTOMY AND ADENOIDECTOMY     childhood  . TUBAL LIGATION     Family History  Problem Relation Age of Onset  . Stroke Mother 65       multiple CVAs  . Hypertension Mother   . Hyperlipidemia Mother   . Osteoporosis Mother   . Mental illness Mother        paranoid schizophrenia  . Heart disease Mother   . Alzheimer's disease Father   . Diabetes Father   . Hypertension Father   . Cancer Father        prostate  . Osteoporosis Sister   . Kidney disease Sister   . Stroke Sister   . Hyperlipidemia Sister   . Hypertension Sister   . Mental illness Sister        depression; multiple behavior health admissions  . Cancer Sister        leukemia  . Heart disease  Sister   . Osteoporosis Maternal Grandmother   . Stroke Maternal Grandmother   . Heart disease Maternal Grandmother   . Osteoporosis Paternal Grandmother   . Alzheimer's disease Paternal Grandfather   . Heart disease Maternal Grandfather   . Cancer Cousin        MATERNAL IST COUSIN - BREAST CA  . Colon cancer Neg Hx   . Esophageal cancer Neg Hx   . Rectal cancer Neg Hx   . Stomach cancer  Neg Hx    Social History   Tobacco Use  . Smoking status: Former Smoker    Packs/day: 1.50    Years: 25.00    Pack years: 37.50    Types: Cigarettes    Quit date: 12/20/1983    Years since quitting: 36.7  . Smokeless tobacco: Never Used  Vaping Use  . Vaping Use: Never used  Substance Use Topics  . Alcohol use: No    Alcohol/week: 0.0 standard drinks    Comment: former heavy use/alcoholism; quit in 1980 with treatment  . Drug use: No    Comment: former abuse of prescription drugs during 1970s.   Current Outpatient Medications  Medication Sig Dispense Refill  . Calcium Carb-Cholecalciferol (CALCIUM 600 + D) 600-200 MG-UNIT TABS Take by mouth.    . Cholecalciferol (VITAMIN D) 2000 UNITS CAPS Take by mouth daily.    . Coenzyme Q10 (COQ10) 100 MG CAPS Take by mouth daily.    . colestipol (COLESTID) 1 g tablet Take 1 tablet (1 g total) by mouth 2 (two) times daily. 60 tablet 3  . doxylamine, Sleep, (UNISOM) 25 MG tablet Take 25 mg by mouth at bedtime as needed.    Marland Kitchen lisinopril-hydrochlorothiazide (PRINZIDE,ZESTORETIC) 20-25 MG tablet Take 1 tablet by mouth daily. 90 tablet 3  . Magnesium 300 MG CAPS Take by mouth daily.     . Multiple Vitamin (MULTIVITAMIN) tablet Take 1 tablet by mouth daily.    . polyethylene glycol powder (GLYCOLAX/MIRALAX) powder Take 17 g by mouth daily. Use as needed for constipation 850 g 3  . rosuvastatin (CRESTOR) 10 MG tablet Take 1 tablet (10 mg total) by mouth daily. 90 tablet 3  . traZODone (DESYREL) 150 MG tablet Take 1 tablet (150 mg total) by mouth at bedtime.  90 tablet 3   No current facility-administered medications for this visit.   Allergies  Allergen Reactions  . Penicillins Other (See Comments)    infant  . Pneumococcal Vaccines   . Sulfamethoxazole-Trimethoprim   . Sulfonamide Derivatives      Review of Systems: All systems reviewed and negative except where noted in HPI.   Labs per HPI  Physical Exam: BP 104/70   Pulse 85   Ht 5' (1.524 m)   Wt 134 lb 4 oz (60.9 kg)   BMI 26.22 kg/m  Constitutional: Pleasant,well-developed, female in no acute distress. Neurological: Alert and oriented to person place and time. Psychiatric: Normal mood and affect. Behavior is normal.   ASSESSMENT AND PLAN: 74 year old female here for reassessment of the following:  Chronic diarrhea - persistent intermittent loose stools now since the beginning of the year.  Her work-up has been unremarkable to include stool studies, lab work, and colonoscopy.  She does take a magnesium supplement on a daily basis and would recommend that she hold that if she does not have a clear magnesium deficiency given these ongoing symptoms.  We discussed options.  Fortunately she has responded well to both Colestid and Imodium.  I discussed both of these.  She thinks Imodium works a bit better for her, would recommend she take half a tab of Imodium per day to 1 tablet, and titrate up or down as needed.  She states her rectal prolapse was much better on this regimen and she will try this.  If she does not like the way Imodium makes her feel can switch back to Colestid to take as needed.  We otherwise discussed dietary options, counseled her on a low FODMAP diet  provided a handout for this.  She agreed.  If she is not happy with the regimen over the next few weeks she should call back for reassessment.  History of colon polyps -6 small adenomas on her last colonoscopy, however in the setting of a fair prep.  No high risk lesions seen, however advised her that some views were  not good given her prep.  We discussed if she wishes to have another surveillance colonoscopy in this light.  She wants to hold off for now but would like to see me in 1 year for reassessment and we will discuss that again, determine if she wants any surveillance given her age  GERD -she has failed Pepcid and intermittent use of omeprazole.  She does need daily use to control her symptoms to some extent.  She is concerned about this in light of her osteopenia.  We discussed again long-term risks benefits of chronic PPI use.  She inquires about an endoscopy, her husband died of esophageal cancer and she has never had an EGD.  Given her age, length of symptoms, ethnicity, and her prior tobacco use, she does meet criteria for screening endoscopy for Barrett's, and I offered this to her as she is otherwise in good health.  If she is a candidate for TIF, she may consider doing this as she wants to come off PPI if possible.  Following discussion of risks and benefits of endoscopy she wants to proceed.  Further recommendations pending the results.  Dodson Branch Cellar, MD [facility]

## 2020-09-15 NOTE — Patient Instructions (Addendum)
If you are age 76 or older, your body mass index should be between 23-30. Your Body mass index is 26.22 kg/m. If this is out of the aforementioned range listed, please consider follow up with your Primary Care Provider.  If you are age 51 or younger, your body mass index should be between 19-25. Your Body mass index is 26.22 kg/m. If this is out of the aformentioned range listed, please consider follow up with your Primary Care Provider.   You have been scheduled for an endoscopy. Please follow written instructions given to you at your visit today. If you use inhalers (even only as needed), please bring them with you on the day of your procedure.  Take half a tablet of imodium once daily. You can increase or decrease as needed to manage diarrhea.   Please hold your magnesium  We are giving you a low FODMAP diet to follow.   Please follow up in one year   Thank you for entrusting me with your care and for choosing [facility], Dr. [place] Cellar

## 2020-09-16 ENCOUNTER — Other Ambulatory Visit: Payer: Self-pay | Admitting: Gastroenterology

## 2020-09-17 ENCOUNTER — Encounter: Payer: Self-pay | Admitting: Gastroenterology

## 2020-09-17 ENCOUNTER — Other Ambulatory Visit: Payer: Self-pay

## 2020-09-17 ENCOUNTER — Ambulatory Visit (AMBULATORY_SURGERY_CENTER): Payer: PPO | Admitting: Gastroenterology

## 2020-09-17 VITALS — BP 126/74 | HR 58 | Temp 97.7°F | Resp 7 | Ht 60.0 in | Wt 134.0 lb

## 2020-09-17 DIAGNOSIS — K219 Gastro-esophageal reflux disease without esophagitis: Secondary | ICD-10-CM

## 2020-09-17 DIAGNOSIS — K449 Diaphragmatic hernia without obstruction or gangrene: Secondary | ICD-10-CM | POA: Diagnosis not present

## 2020-09-17 DIAGNOSIS — K317 Polyp of stomach and duodenum: Secondary | ICD-10-CM | POA: Diagnosis not present

## 2020-09-17 DIAGNOSIS — Z1211 Encounter for screening for malignant neoplasm of colon: Secondary | ICD-10-CM | POA: Diagnosis not present

## 2020-09-17 DIAGNOSIS — K228 Other specified diseases of esophagus: Secondary | ICD-10-CM | POA: Diagnosis not present

## 2020-09-17 MED ORDER — SODIUM CHLORIDE 0.9 % IV SOLN: 500.0000 mL | Freq: Once | INTRAVENOUS | Status: DC

## 2020-09-17 MED ORDER — OMEPRAZOLE 20 MG PO CPDR: 20.0000 mg | DELAYED_RELEASE_CAPSULE | Freq: Every day | ORAL | 3 refills | Status: DC

## 2020-09-17 NOTE — Progress Notes (Signed)
VS taken by C.W. 

## 2020-09-17 NOTE — Progress Notes (Signed)
Called to room to assist during endoscopic procedure.  Patient ID and intended procedure confirmed with present staff. Received instructions for my participation in the procedure from the performing physician.  

## 2020-09-17 NOTE — Patient Instructions (Addendum)
Handouts were given to you on a Hiatal Hernia and GERD. A prescription for OMEPRAZOLE 20 mg was sent to [facility] to be picked up.  Best to take daily in the am.  Best to take on em empty stomach 20-30 minutes before breakfast. You may resume your other current medications today. Await biopsy results. Please call if any questions or concerns.      YOU HAD AN ENDOSCOPIC PROCEDURE TODAY AT [facility]:   Refer to the procedure report that was given to you for any specific questions about what was found during the examination.  If the procedure report does not answer your questions, please call your gastroenterologist to clarify.  If you requested that your care partner not be given the details of your procedure findings, then the procedure report has been included in a sealed envelope for you to review at your convenience later.  YOU SHOULD EXPECT: Some feelings of bloating in the abdomen. Passage of more gas than usual.  Walking can help get rid of the air that was put into your GI tract during the procedure and reduce the bloating. If you had a lower endoscopy (such as a colonoscopy or flexible sigmoidoscopy) you may notice spotting of blood in your stool or on the toilet paper. If you underwent a bowel prep for your procedure, you may not have a normal bowel movement for a few days.  Please Note:  You might notice some irritation and congestion in your nose or some drainage.  This is from the oxygen used during your procedure.  There is no need for concern and it should clear up in a day or so.  SYMPTOMS TO REPORT IMMEDIATELY:     Following upper endoscopy (EGD)  Vomiting of blood or coffee ground material  New chest pain or pain under the shoulder blades  Painful or persistently difficult swallowing  New shortness of breath  Fever of 100F or higher  Black, tarry-looking stools  For urgent or emergent issues, a gastroenterologist can be reached at any hour by  calling [phone]. Do not use MyChart messaging for urgent concerns.    DIET:  We do recommend a small meal at first, but then you may proceed to your regular diet.  Drink plenty of fluids but you should avoid alcoholic beverages for 24 hours.  ACTIVITY:  You should plan to take it easy for the rest of today and you should NOT DRIVE or use heavy machinery until tomorrow (because of the sedation medicines used during the test).    FOLLOW UP: Our staff will call the number listed on your records 48-72 hours following your procedure to check on you and address any questions or concerns that you may have regarding the information given to you following your procedure. If we do not reach you, we will leave a message.  We will attempt to reach you two times.  During this call, we will ask if you have developed any symptoms of COVID 19. If you develop any symptoms (ie: fever, flu-like symptoms, shortness of breath, cough etc.) before then, please call [phone].  If you test positive for Covid 19 in the 2 weeks post procedure, please call and report this information to [place].    If any biopsies were taken you will be contacted by phone or by letter within the next 1-3 weeks.  Please call us at [phone] if you have not heard about the biopsies in 3 weeks.  SIGNATURES/CONFIDENTIALITY: You and/or your care partner have signed paperwork which will be entered into your electronic medical record.  These signatures attest to the fact that that the information above on your After Visit Summary has been reviewed and is understood.  Full responsibility of the confidentiality of this discharge information lies with you and/or your care-partner.

## 2020-09-17 NOTE — Op Note (Signed)
[facility] Patient Name: Cheryl Kent Procedure Date: 09/17/2020 1:15 PM MRN: [id number] Endoscopist: Viviann Spare P. Adela Lank , MD Age: 74 Referring MD:  Date of Birth: [birth year] Gender: Female Account #: [id number] Procedure:                Upper GI endoscopy Indications:              Screening for Barrett's esophagus, history of GERD,                            on longstanding omeprazole Medicines:                Monitored Anesthesia Care Procedure:                Pre-Anesthesia Assessment:                           - Prior to the procedure, a History and Physical                            was performed, and patient medications and                            allergies were reviewed. The patient's tolerance of                            previous anesthesia was also reviewed. The risks                            and benefits of the procedure and the sedation                            options and risks were discussed with the patient.                            All questions were answered, and informed consent                            was obtained. Prior Anticoagulants: The patient has                            taken no previous anticoagulant or antiplatelet                            agents. ASA Grade Assessment: II - A patient with                            mild systemic disease. After reviewing the risks                            and benefits, the patient was deemed in                            satisfactory condition to undergo the procedure.  After obtaining informed consent, the endoscope was                            passed under direct vision. Throughout the                            procedure, the patient's blood pressure, pulse, and                            oxygen saturations were monitored continuously. The                            Endoscope was introduced through the mouth, and                            advanced to the second  part of duodenum. The upper                            GI endoscopy was accomplished without difficulty.                            The patient tolerated the procedure well. Scope In: Scope Out: Findings:                 Esophagogastric landmarks were identified: the                            Z-line was found at 29 cm, the gastroesophageal                            junction was found at 29 cm and the upper extent of                            the gastric folds was found at 33 cm from the                            incisors.                           A 4 cm hiatal hernia was present.                           The Z-line was irregular with slightly inflamed /                            nodular focal area of mucosa. I suspect benign                            inflammatory changes but biopsies were taken with a                            cold forceps for histology.  The examined esophagus was tortuous.                           A single 3 mm sessile polyp was found in the cardia                            within the hernia sac. The polyp was removed with a                            cold biopsy forceps. Resection and retrieval were                            complete.                           The exam of the stomach was otherwise normal.                           The duodenal bulb and second portion of the                            duodenum were normal. Complications:            No immediate complications. Estimated blood loss:                            Minimal. Estimated Blood Loss:     Estimated blood loss was minimal. Impression:               - Esophagogastric landmarks identified.                           - 4 cm hiatal hernia.                           - Z-line with focal changes as described, suspect                            benign inflammatory changes. Biopsied.                           - Tortuous esophagus.                           - A single  small benign appearing gastric polyp.                            Resected and retrieved.                           - Normal stomach otherwise                           - Normal duodenal bulb and second portion of the                            duodenum. Recommendation:           -  Patient has a contact number available for                            emergencies. The signs and symptoms of potential                            delayed complications were discussed with the                            patient. Return to normal activities tomorrow.                            Written discharge instructions were provided to the                            patient.                           - Resume previous diet.                           - Continue present medications.                           - Await pathology results.                           - Patient is not a candidate for TIF itself given                            exam findings, if she does not wish to take PPI                            long term, then would recommend hiatal hernia                            repair +/- TIF. Will discuss options with patient [name]. [name], MD 09/17/2020 1:45:45 PM This report has been signed electronically.

## 2020-09-17 NOTE — Progress Notes (Signed)
A and O x3. Report to RN. Tolerated MAC anesthesia well.Teeth unchanged after procedure.

## 2020-09-17 NOTE — Progress Notes (Signed)
Per Dr. Havery Moros he wants to get the bx back first, but he discussed with pt to either stay on OMEPRAZOLE 20 mg daily or surgery.  He told the pt he thought staying on the OMEPRAZOLE would be the lesser of evils for now.  Pt stated she was getting OMEPRAZOLE 20 mg OTC and asked for a prescription, as it may be cheaper for her.  [facility] [address]  #[facility] with 3 refill. Maw  No problems noted in the recovery room. maw

## 2020-09-21 ENCOUNTER — Telehealth: Payer: Self-pay

## 2020-09-21 NOTE — Telephone Encounter (Signed)
  Follow up Call-  Call back number 09/17/2020 06/17/2020  Post procedure Call Back phone  # [phone]  Permission to leave phone message Yes Yes  Some recent data might be hidden     Patient questions:  Do you have a fever, pain , or abdominal swelling? No. Pain Score  0 *  Have you tolerated food without any problems? Yes.    Have you been able to return to your normal activities? Yes.    Do you have any questions about your discharge instructions: Diet   No. Medications  No. Follow up visit  No.  Do you have questions or concerns about your Care? No.  Actions: * If pain score is 4 or above: No action needed, pain <4.  1. Have you developed a fever since your procedure? no  2.   Have you had an respiratory symptoms (SOB or cough) since your procedure? no  3.   Have you tested positive for COVID 19 since your procedure no  4.   Have you had any family members/close contacts diagnosed with the COVID 19 since your procedure?  no   If yes to any of these questions please route to Joylene John, RN and Joella Prince, RN

## 2020-10-12 DIAGNOSIS — H524 Presbyopia: Secondary | ICD-10-CM | POA: Diagnosis not present

## 2020-10-12 DIAGNOSIS — Z961 Presence of intraocular lens: Secondary | ICD-10-CM | POA: Diagnosis not present

## 2020-10-12 DIAGNOSIS — H04123 Dry eye syndrome of bilateral lacrimal glands: Secondary | ICD-10-CM | POA: Diagnosis not present

## 2020-10-12 DIAGNOSIS — H26493 Other secondary cataract, bilateral: Secondary | ICD-10-CM | POA: Diagnosis not present

## 2020-10-12 DIAGNOSIS — H40013 Open angle with borderline findings, low risk, bilateral: Secondary | ICD-10-CM | POA: Diagnosis not present

## 2020-11-16 DIAGNOSIS — E785 Hyperlipidemia, unspecified: Secondary | ICD-10-CM | POA: Diagnosis not present

## 2020-11-16 DIAGNOSIS — K219 Gastro-esophageal reflux disease without esophagitis: Secondary | ICD-10-CM | POA: Diagnosis not present

## 2020-11-16 DIAGNOSIS — J45909 Unspecified asthma, uncomplicated: Secondary | ICD-10-CM | POA: Diagnosis not present

## 2020-11-16 DIAGNOSIS — K623 Rectal prolapse: Secondary | ICD-10-CM | POA: Diagnosis not present

## 2020-11-16 DIAGNOSIS — M8580 Other specified disorders of bone density and structure, unspecified site: Secondary | ICD-10-CM | POA: Diagnosis not present

## 2020-11-16 DIAGNOSIS — F411 Generalized anxiety disorder: Secondary | ICD-10-CM | POA: Diagnosis not present

## 2020-11-16 DIAGNOSIS — D126 Benign neoplasm of colon, unspecified: Secondary | ICD-10-CM | POA: Diagnosis not present

## 2020-11-16 DIAGNOSIS — E559 Vitamin D deficiency, unspecified: Secondary | ICD-10-CM | POA: Diagnosis not present

## 2020-11-16 DIAGNOSIS — I10 Essential (primary) hypertension: Secondary | ICD-10-CM | POA: Diagnosis not present

## 2020-11-16 DIAGNOSIS — R7989 Other specified abnormal findings of blood chemistry: Secondary | ICD-10-CM | POA: Diagnosis not present

## 2020-11-16 DIAGNOSIS — R7301 Impaired fasting glucose: Secondary | ICD-10-CM | POA: Diagnosis not present

## 2020-12-10 DIAGNOSIS — K219 Gastro-esophageal reflux disease without esophagitis: Secondary | ICD-10-CM | POA: Diagnosis not present

## 2020-12-10 DIAGNOSIS — Z20822 Contact with and (suspected) exposure to covid-19: Secondary | ICD-10-CM | POA: Diagnosis not present

## 2020-12-10 DIAGNOSIS — I10 Essential (primary) hypertension: Secondary | ICD-10-CM | POA: Diagnosis not present

## 2020-12-10 DIAGNOSIS — J45909 Unspecified asthma, uncomplicated: Secondary | ICD-10-CM | POA: Diagnosis not present

## 2020-12-16 ENCOUNTER — Ambulatory Visit (INDEPENDENT_AMBULATORY_CARE_PROVIDER_SITE_OTHER): Payer: PPO

## 2020-12-16 ENCOUNTER — Other Ambulatory Visit: Payer: Self-pay

## 2020-12-16 ENCOUNTER — Encounter: Payer: Self-pay | Admitting: Podiatry

## 2020-12-16 ENCOUNTER — Ambulatory Visit: Payer: PPO | Admitting: Podiatry

## 2020-12-16 DIAGNOSIS — L84 Corns and callosities: Secondary | ICD-10-CM

## 2020-12-16 DIAGNOSIS — M2042 Other hammer toe(s) (acquired), left foot: Secondary | ICD-10-CM

## 2020-12-16 DIAGNOSIS — M7751 Other enthesopathy of right foot: Secondary | ICD-10-CM | POA: Diagnosis not present

## 2020-12-16 DIAGNOSIS — M2041 Other hammer toe(s) (acquired), right foot: Secondary | ICD-10-CM

## 2020-12-16 DIAGNOSIS — M779 Enthesopathy, unspecified: Secondary | ICD-10-CM

## 2020-12-16 NOTE — Progress Notes (Signed)
Subjective:   Patient ID: Cheryl Kent, female   DOB: 74 y.o.   MRN: [id number]   HPI Patient presents concerned about digital deformities of 3 toes on the right foot and also lesion formation that becomes painful and makes it hard to walk.  Patient states this 1 toes been this way a long time and the other is gotten worse recently and patient does not smoke and likes to be very active   Review of Systems  All other systems reviewed and are negative.       Objective:  Physical Exam Vitals and nursing note reviewed.  Constitutional:      Appearance: She is well-developed and well-nourished.  Cardiovascular:     Pulses: Intact distal pulses.  Pulmonary:     Effort: Pulmonary effort is normal.  Musculoskeletal:        General: Normal range of motion.  Skin:    General: Skin is warm.  Neurological:     Mental Status: She is alert.     Neurovascular status intact muscle strength was found to be adequate range of motion adequate with patient found that digital deformity digits 234 right foot with elevated second and third toes with rigid contracture and frontal plane rotation fourth toe right painful when palpated.  Patient is noted to have good digital perfusion well oriented x3     Assessment:  Hammertoe deformity of digits 234 right along with keratotic lesion formation painful when palpated fourth digit     Plan:  H&P reviewed conditions and x-ray and debrided the lesion recommended wider shoe softer materials and patient will be seen back on an as-needed basis.  Patient does understand digital fusion and distal arthroplasty joint for which may be necessary with education rendered today  X-rays indicate significant elevation of the lesser digits right with significant frontal plane and transverse plane deformity

## 2021-03-01 DIAGNOSIS — D510 Vitamin B12 deficiency anemia due to intrinsic factor deficiency: Secondary | ICD-10-CM | POA: Diagnosis not present

## 2021-03-01 DIAGNOSIS — I10 Essential (primary) hypertension: Secondary | ICD-10-CM | POA: Diagnosis not present

## 2021-03-01 DIAGNOSIS — Z20822 Contact with and (suspected) exposure to covid-19: Secondary | ICD-10-CM | POA: Diagnosis not present

## 2021-03-01 DIAGNOSIS — Z0000 Encounter for general adult medical examination without abnormal findings: Secondary | ICD-10-CM | POA: Diagnosis not present

## 2021-03-01 DIAGNOSIS — J45909 Unspecified asthma, uncomplicated: Secondary | ICD-10-CM | POA: Diagnosis not present

## 2021-03-01 DIAGNOSIS — E039 Hypothyroidism, unspecified: Secondary | ICD-10-CM | POA: Diagnosis not present

## 2021-03-01 DIAGNOSIS — M179 Osteoarthritis of knee, unspecified: Secondary | ICD-10-CM | POA: Diagnosis not present

## 2021-03-01 DIAGNOSIS — K219 Gastro-esophageal reflux disease without esophagitis: Secondary | ICD-10-CM | POA: Diagnosis not present

## 2021-03-01 DIAGNOSIS — Z0001 Encounter for general adult medical examination with abnormal findings: Secondary | ICD-10-CM | POA: Diagnosis not present

## 2021-03-01 DIAGNOSIS — E785 Hyperlipidemia, unspecified: Secondary | ICD-10-CM | POA: Diagnosis not present

## 2021-03-01 DIAGNOSIS — E559 Vitamin D deficiency, unspecified: Secondary | ICD-10-CM | POA: Diagnosis not present

## 2021-03-01 DIAGNOSIS — G4700 Insomnia, unspecified: Secondary | ICD-10-CM | POA: Diagnosis not present

## 2021-03-01 DIAGNOSIS — R7301 Impaired fasting glucose: Secondary | ICD-10-CM | POA: Diagnosis not present

## 2021-03-16 DIAGNOSIS — I10 Essential (primary) hypertension: Secondary | ICD-10-CM | POA: Diagnosis not present

## 2021-03-16 DIAGNOSIS — R7303 Prediabetes: Secondary | ICD-10-CM | POA: Diagnosis not present

## 2021-03-16 DIAGNOSIS — Z6825 Body mass index (BMI) 25.0-25.9, adult: Secondary | ICD-10-CM | POA: Diagnosis not present

## 2021-03-16 DIAGNOSIS — E785 Hyperlipidemia, unspecified: Secondary | ICD-10-CM | POA: Diagnosis not present

## 2021-03-16 DIAGNOSIS — K219 Gastro-esophageal reflux disease without esophagitis: Secondary | ICD-10-CM | POA: Diagnosis not present

## 2021-03-16 DIAGNOSIS — M810 Age-related osteoporosis without current pathological fracture: Secondary | ICD-10-CM | POA: Diagnosis not present

## 2021-05-31 ENCOUNTER — Other Ambulatory Visit: Payer: Self-pay | Admitting: Family Medicine

## 2021-05-31 DIAGNOSIS — Z1231 Encounter for screening mammogram for malignant neoplasm of breast: Secondary | ICD-10-CM

## 2021-07-23 ENCOUNTER — Other Ambulatory Visit: Payer: Self-pay

## 2021-07-23 ENCOUNTER — Ambulatory Visit
Admission: RE | Admit: 2021-07-23 | Discharge: 2021-07-23 | Disposition: A | Discharge status: Home / Self Care | Payer: PPO | Source: Ambulatory Visit | Attending: Family Medicine | Admitting: Family Medicine

## 2021-07-23 DIAGNOSIS — Z1231 Encounter for screening mammogram for malignant neoplasm of breast: Secondary | ICD-10-CM | POA: Diagnosis not present

## 2021-07-28 DIAGNOSIS — H40013 Open angle with borderline findings, low risk, bilateral: Secondary | ICD-10-CM | POA: Diagnosis not present

## 2021-07-28 DIAGNOSIS — H04123 Dry eye syndrome of bilateral lacrimal glands: Secondary | ICD-10-CM | POA: Diagnosis not present

## 2021-08-04 ENCOUNTER — Other Ambulatory Visit: Payer: Self-pay | Admitting: Family Medicine

## 2021-08-04 DIAGNOSIS — M810 Age-related osteoporosis without current pathological fracture: Secondary | ICD-10-CM

## 2021-08-19 ENCOUNTER — Other Ambulatory Visit: Payer: Self-pay

## 2021-08-19 ENCOUNTER — Other Ambulatory Visit: Payer: Self-pay | Admitting: Gastroenterology

## 2021-08-19 ENCOUNTER — Encounter: Payer: Self-pay | Admitting: Physician Assistant

## 2021-08-19 ENCOUNTER — Ambulatory Visit: Payer: PPO | Admitting: Physician Assistant

## 2021-08-19 DIAGNOSIS — Z86007 Personal history of in-situ neoplasm of skin: Secondary | ICD-10-CM

## 2021-08-19 DIAGNOSIS — K219 Gastro-esophageal reflux disease without esophagitis: Secondary | ICD-10-CM

## 2021-08-19 DIAGNOSIS — Z1283 Encounter for screening for malignant neoplasm of skin: Secondary | ICD-10-CM | POA: Diagnosis not present

## 2021-08-19 DIAGNOSIS — L570 Actinic keratosis: Secondary | ICD-10-CM

## 2021-08-30 ENCOUNTER — Encounter: Payer: Self-pay | Admitting: Physician Assistant

## 2021-08-30 NOTE — Progress Notes (Signed)
   Follow-Up Visit   Subjective  Cheryl Kent is a 75 y.o. female who presents for the following: Annual Exam (Patient has history of cis right forehead, she just needs yearly skin check).   The following portions of the chart were reviewed this encounter and updated as appropriate:  Tobacco  Allergies  Meds  Problems  Med Hx  Surg Hx  Fam Hx      Objective  Well appearing patient in no apparent distress; mood and affect are within normal limits.  A full examination was performed including scalp, head, eyes, ears, nose, lips, neck, chest, axillae, abdomen, back, buttocks, bilateral upper extremities, bilateral lower extremities, hands, feet, fingers, toes, fingernails, and toenails. All findings within normal limits unless otherwise noted below.  Right Forehead Full body skin check, many keratoses forehead scar was clear previous cis    Assessment & Plan  AK (actinic keratosis) Right Forehead  Field treatment with fluorouracil cream.  Destruction of lesion - Right Forehead Complexity: simple   Destruction method: cryotherapy   Informed consent: discussed and consent obtained   Timeout:  patient name, date of birth, surgical site, and procedure verified Lesion destroyed using liquid nitrogen: Yes   Cryotherapy cycles:  2 Outcome: patient tolerated procedure well with no complications   Post-procedure details: wound care instructions given      I, Sarahann Horrell, PA-C, have reviewed all documentation's for this visit.  The documentation on 08/30/21 for the exam, diagnosis, procedures and orders are all accurate and complete.

## 2021-09-03 DIAGNOSIS — Z23 Encounter for immunization: Secondary | ICD-10-CM | POA: Diagnosis not present

## 2021-09-15 ENCOUNTER — Ambulatory Visit: Payer: PPO | Admitting: Gastroenterology

## 2021-09-15 ENCOUNTER — Encounter: Payer: Self-pay | Admitting: Gastroenterology

## 2021-09-15 VITALS — BP 122/80 | HR 93 | Ht 60.0 in | Wt 132.0 lb

## 2021-09-15 DIAGNOSIS — K219 Gastro-esophageal reflux disease without esophagitis: Secondary | ICD-10-CM

## 2021-09-15 DIAGNOSIS — K449 Diaphragmatic hernia without obstruction or gangrene: Secondary | ICD-10-CM | POA: Diagnosis not present

## 2021-09-15 DIAGNOSIS — Z86010 Personal history of colonic polyps: Secondary | ICD-10-CM

## 2021-09-15 DIAGNOSIS — Z79899 Other long term (current) drug therapy: Secondary | ICD-10-CM

## 2021-09-15 DIAGNOSIS — K623 Rectal prolapse: Secondary | ICD-10-CM

## 2021-09-15 MED ORDER — SUTAB 1479-225-188 MG PO TABS
1.0000 | ORAL_TABLET | Freq: Once | ORAL | 0 refills | Status: AC
Start: 2021-09-15 — End: 2021-09-15

## 2021-09-15 MED ORDER — CITRUCEL PO POWD: ORAL | Status: AC

## 2021-09-15 MED ORDER — FAMOTIDINE 20 MG PO TABS: 20.0000 mg | ORAL_TABLET | Freq: Every day | ORAL | 1 refills | Status: AC

## 2021-09-15 NOTE — Progress Notes (Signed)
HPI :  75 year old female here for a follow-up visit regarding GERD, history of colon polyps, rectal prolapse.  Recall that she has had longstanding constipation over the years which led to rectal prolapse.  She has been seen by both Dr. Marcello Moores and Dr. Drue Flirt about possible surgery for her rectal prolapse but has wished to avoid surgery if possible.  Over time her constipation turned into loose stools for which she had an evaluation to include stool test, celiac testing, colonoscopy which ruled out colonoscopy.  Her last colonoscopy was about a year ago when she had 6 small adenomas noted, however but her prep was fair at the time and she had accidentally taken Imodium with her bowel prep.  Since I last seen her she states she is actually not had much diarrhea.  Her bowels now fluctuate between loose stools and more constipated stools.  She takes MiraLAX about once a week.  At baseline stools perhaps slightly more loose and constipated.  She has not tried a fiber supplement yet.  She has done pelvic floor PT in the past, but continues to have prolapse periodically.  We discussed if she wants surgery for this at some point depending on how much it bothers her.  Otherwise recall she has been on omeprazole 20 mg a day for reflux for a long time.  She does have osteopenia and we have tried to minimize amount of PPI she has been using.  She had an EGD with me in September of last year showing a 4 cm hiatal hernia and some inflammatory changes at the GE J but no Barrett's esophagus.  She is not a candidate for TIF alone, would need hiatal hernia repair.  Since have last seen her she has been using omeprazole in the morning and states throughout most of the day she does okay but does have nocturnal symptoms of bother her and can frequently take Tums or other counter antacids to help with this.  No dysphagia.  We discussed her options to better manage her reflux. Her husband died of esophageal cancer and she is  a bit anxious about this issue. Her renal function is normal.      Colonoscopy 06/17/20 - Preparation of the colon was fair. - The examined portion of the ileum was normal. - One diminutive polyp in the cecum, removed with a cold biopsy forceps. Resected and retrieved. - One 3 mm polyp in the ascending colon, removed with a cold snare. Resected and retrieved. - One 4 mm polyp in the transverse colon, removed with a cold snare. Resected and retrieved. - Two 5 to 6 mm polyps at the splenic flexure, removed with a cold snare. Resected and retrieved. - One 3 mm polyp in the descending colon, removed with a cold snare. Resected and retrieved. - Erythematous mucosa in the rectum, grossly consistent with prolapse changes. Biopsied. - Diverticulosis in the entire examined colon. - The examination was otherwise normal. - Biopsies were taken with a cold forceps, rule out microscopic colitis.   Diagnosis 1. Surgical [P], colon, cecum, ascending, transverse, splenic flexure, descending, polyp (6) - TUBULAR ADENOMA(S) WITHOUT HIGH-GRADE DYSPLASIA OR MALIGNANCY - SESSILE SERRATED POLYP(S) WITHOUT CYTOLOGIC DYSPLASIA 2. Surgical [P], colon, random sites - COLONIC MUCOSA WITH NO SPECIFIC HISTOPATHOLOGIC CHANGES - NEGATIVE FOR ACUTE INFLAMMATION, INCREASED INTRAEPITHELIAL LYMPHOCYTES OR THICKENED SUBEPITHELIAL COLLAGEN TABLE 3. Surgical [P], colon, rectum - RECTAL MUCOSA WITH REACTIVE CHANGES AND MUSCULARIZATION OF LAMINA PROPRIA, CONSISTENT WITH POSSIBLE PROLAPSE    EGD 09/17/2020 -  -  A 4 cm hiatal hernia was present. - The Z-line was irregular with slightly inflamed / nodular focal area of mucosa. I suspect benign inflammatory changes but biopsies were taken with a cold forceps for histology. - The examined esophagus was tortuous. - A single 3 mm sessile polyp was found in the cardia within the hernia sac. The polyp was removed with a cold biopsy forceps. Resection and retrieval were  complete. - The exam of the stomach was otherwise normal. - The duodenal bulb and second portion of the duodenum were normal.  1. Surgical [P], stomach, cardia polyp - HYPERPLASTIC POLYP. - NEGATIVE FOR INTESTINAL METAPLASIA (GOBLET CELL METAPLASIA). 2. Surgical [P], esophagus, ge junction - SQUAMOCOLUMNAR ESOPHAGEAL MUCOSA WITH REACTIVE/REGENERATIVE CHANGES. - NEGATIVE FOR INTESTINAL METAPLASIA (GOBLET CELL METAPLASIA).   Past Medical History:  Diagnosis Date   Acid reflux    Alcoholism ([place])    per pt she has been sober for 21 years   Allergy    previous allergy shots.     Anxiety    Arthritis    DDD lumbar spine, hands B   Cataract    Depression    Fecal incontinence    GERD (gastroesophageal reflux disease)    Hemorrhoids    History of colon polyps    History of UTI    Hyperlipemia    Hypertension    IBS (irritable bowel syndrome)    Osteoarthritis of knee    Positive QuantiFERON-TB Gold test 12/19/2010   Snider/ID; treated three months.  2012.   Rectal prolapse    Rosacea conjunctivitis    Squamous cell carcinoma, face 07/05/2016   right forehead - CX3 + cautery + 5FU   Substance abuse ([place])    alcohol   Tuberculosis 2012     Past Surgical History:  Procedure Laterality Date   BREAST EXCISIONAL BIOPSY Right    benign x 2   BREAST EXCISIONAL BIOPSY Left    benign   BREAST SURGERY     #1 age 67 yo  #2 age 65's   EYE SURGERY Left 10/07/2014   Cataract removal- Dr. Herbert Deaner   EYE SURGERY Right 11/18/2014   Cataract removal- Dr. Herbert Deaner   FOOT SURGERY     HAND SURGERY     SHOULDER SURGERY     SKIN BIOPSY Right 07/05/2016   right forehead   TONSILLECTOMY AND ADENOIDECTOMY     childhood   TUBAL LIGATION     Family History  Problem Relation Age of Onset   Stroke Mother 34       multiple CVAs   Hypertension Mother    Hyperlipidemia Mother    Osteoporosis Mother    Mental illness Mother        paranoid schizophrenia   Heart disease Mother     Alzheimer's disease Father    Diabetes Father    Hypertension Father    Cancer Father        prostate   Osteoporosis Sister    Kidney disease Sister    Stroke Sister    Hyperlipidemia Sister    Hypertension Sister    Mental illness Sister        depression; multiple behavior health admissions   Cancer Sister        leukemia   Heart disease Sister    Osteoporosis Maternal Grandmother    Stroke Maternal Grandmother    Heart disease Maternal Grandmother    Heart disease Maternal Grandfather    Osteoporosis Paternal Grandmother  Alzheimer's disease Paternal Grandfather    Cancer Cousin        MATERNAL IST COUSIN - BREAST CA   Colon cancer Neg Hx    Esophageal cancer Neg Hx    Rectal cancer Neg Hx    Stomach cancer Neg Hx    Breast cancer Neg Hx    Social History   Tobacco Use   Smoking status: Former    Packs/day: 1.50    Years: 25.00    Pack years: 37.50    Types: Cigarettes    Quit date: 12/20/1983    Years since quitting: 37.7   Smokeless tobacco: Never  Vaping Use   Vaping Use: Never used  Substance Use Topics   Alcohol use: No    Alcohol/week: 0.0 standard drinks    Comment: former heavy use/alcoholism; quit in 1980 with treatment   Drug use: No    Comment: former abuse of prescription drugs during 1970s.   Current Outpatient Medications  Medication Sig Dispense Refill   Calcium Carb-Cholecalciferol (CALCIUM 600 + D) 600-200 MG-UNIT TABS Take by mouth.     Cholecalciferol (VITAMIN D) 2000 UNITS CAPS Take by mouth daily.     Coenzyme Q10 (COQ10) 100 MG CAPS Take by mouth daily.     Cyanocobalamin (VITAMIN B 12 PO) Take 1,000 mg by mouth daily.     folic acid (FOLVITE) 086 MCG tablet Take 400 mcg by mouth daily.     lisinopril-hydrochlorothiazide (PRINZIDE,ZESTORETIC) 20-25 MG tablet Take 1 tablet by mouth daily. 90 tablet 3   Magnesium 300 MG CAPS Take by mouth daily.      Multiple Vitamin (MULTIVITAMIN) tablet Take 1 tablet by mouth daily.     omeprazole  (PRILOSEC) 20 MG capsule Take 1 capsule (20 mg total) by mouth daily before breakfast. Please keep your upcoming appointment on 9-28 for further refills. thanks 90 capsule 0   polyethylene glycol powder (GLYCOLAX/MIRALAX) powder Take 17 g by mouth daily. Use as needed for constipation 850 g 3   rosuvastatin (CRESTOR) 10 MG tablet Take 1 tablet (10 mg total) by mouth daily. 90 tablet 3   traZODone (DESYREL) 150 MG tablet Take 1 tablet (150 mg total) by mouth at bedtime. 90 tablet 3   No current facility-administered medications for this visit.   Allergies  Allergen Reactions   Penicillins Other (See Comments)    infant   Pneumococcal Vaccines    Sulfamethoxazole-Trimethoprim    Sulfonamide Derivatives      Review of Systems: All systems reviewed and negative except where noted in HPI.    Lab Results  Component Value Date   WBC 4.6 10/26/2018   HGB 13.8 10/26/2018   HCT 39.0 10/26/2018   MCV 100 (H) 10/26/2018   PLT 330 10/26/2018    Lab Results  Component Value Date   CREATININE 0.91 10/26/2018   BUN 10 10/26/2018   NA 140 10/26/2018   K 3.9 10/26/2018   CL 97 10/26/2018   CO2 27 10/26/2018    Lab Results  Component Value Date   ALT 22 10/26/2018   AST 25 10/26/2018   ALKPHOS 56 10/26/2018   BILITOT 0.7 10/26/2018     Physical Exam: BP 122/80   Pulse 93   Ht 5' (1.524 m)   Wt 132 lb (59.9 kg)   BMI 25.78 kg/m  Constitutional: Pleasant,well-developed, female in no acute distress. Neurological: Alert and oriented to person place and time. Psychiatric: Normal mood and affect. Behavior is normal.   ASSESSMENT  AND PLAN: 75 year old female here for reassessment of following:  GERD Hiatal hernia Long-term use of PPI Rectal prolapse History of colon polyps  Longstanding GERD.  She does okay on low-dose omeprazole 20 mg a day but having some breakthrough at nighttime.  She wants to avoid surgery/TIF if possible.  We did discuss again long-term risks of  chronic PPI use, she does have osteoporosis and is at high risk for fracture, want to use the lowest dose of PPI needed to control symptoms.  We discussed her options at this point for her nocturnal symptoms.  We will add Pepcid 20 mg nightly and see if we can avoid escalation of omeprazole, she is agreeable to do this.  If this does not help her she should contact me.  Otherwise we discussed her history of rectal prolapse, she is having more loose stools then constipated stools but tends to go back and forth.  We will try Citrucel once daily to see if that can provide some more regularity.  We discussed surgical management of rectal prolapse and she wants to avoid that if at all possible, she has been evaluated by 2 different surgeons for this in the past and declined.  Otherwise her last colonoscopy showed 6 adenomas but the prep was fair.  We discussed if she wanted to have another colonoscopy.  Further surveillance given her age.  After discussion of colonoscopy, risks and benefits, she wanted to proceed with scheduling this.  We will use a prep and a half for this exam and recommend she avoid all Imodium within a few days of the preparation, she agreed with the plan.  Plan: - colonoscopy - don't use immodium beforehand, prep and a half - start citrucel once daily - continue omeprazole 20mg  / day, discussed long term risks - add pepcid 20mg  q HS  - she wishes to hold off on rectal prolapse surgery for now  Jolly Mango, MD [facility]

## 2021-09-15 NOTE — Patient Instructions (Addendum)
If you are age 77 or older, your body mass index should be between 23-30. Your Body mass index is 25.78 kg/m. If this is out of the aforementioned range listed, please consider follow up with your Primary Care Provider.  If you are age 37 or younger, your body mass index should be between 19-25. Your Body mass index is 25.78 kg/m. If this is out of the aformentioned range listed, please consider follow up with your Primary Care Provider.   __________________________________________________________  The [facility] providers would like to encourage you to use [facility] to communicate with providers for non-urgent requests or questions.  Due to long hold times on the telephone, sending your provider a message by [facility] may be a faster and more efficient way to get a response.  Please allow 48 business hours for a response.  Please remember that this is for non-urgent requests.   You have been scheduled for a colonoscopy. Please follow written instructions given to you at your visit today.  Please pick up your prep supplies at the pharmacy within the next 1-3 days. If you use inhalers (even only as needed), please bring them with you on the day of your procedure.  Continue omeprazole.  We have sent the following medications to your pharmacy for you to pick up at your convenience: Pepcid 20 mg: Take daily at bedtime  Please purchase the following medications over the counter and take as directed: Citrucel: Take as directed daily  Thank you for entrusting me with your care and for choosing [facility], Dr. Conyngham Cellar

## 2021-09-20 DIAGNOSIS — E785 Hyperlipidemia, unspecified: Secondary | ICD-10-CM | POA: Diagnosis not present

## 2021-09-20 DIAGNOSIS — R7303 Prediabetes: Secondary | ICD-10-CM | POA: Diagnosis not present

## 2021-09-20 DIAGNOSIS — Z0000 Encounter for general adult medical examination without abnormal findings: Secondary | ICD-10-CM | POA: Diagnosis not present

## 2021-09-20 DIAGNOSIS — K219 Gastro-esophageal reflux disease without esophagitis: Secondary | ICD-10-CM | POA: Diagnosis not present

## 2021-09-20 DIAGNOSIS — M810 Age-related osteoporosis without current pathological fracture: Secondary | ICD-10-CM | POA: Diagnosis not present

## 2021-09-20 DIAGNOSIS — Z1331 Encounter for screening for depression: Secondary | ICD-10-CM | POA: Diagnosis not present

## 2021-09-20 DIAGNOSIS — Z23 Encounter for immunization: Secondary | ICD-10-CM | POA: Diagnosis not present

## 2021-09-20 DIAGNOSIS — K623 Rectal prolapse: Secondary | ICD-10-CM | POA: Diagnosis not present

## 2021-09-20 DIAGNOSIS — Z1339 Encounter for screening examination for other mental health and behavioral disorders: Secondary | ICD-10-CM | POA: Diagnosis not present

## 2021-09-20 DIAGNOSIS — I10 Essential (primary) hypertension: Secondary | ICD-10-CM | POA: Diagnosis not present

## 2021-09-22 ENCOUNTER — Encounter: Payer: Self-pay | Admitting: Certified Registered Nurse Anesthetist

## 2021-09-23 ENCOUNTER — Ambulatory Visit (AMBULATORY_SURGERY_CENTER): Payer: PPO | Admitting: Gastroenterology

## 2021-09-23 ENCOUNTER — Encounter: Payer: Self-pay | Admitting: Gastroenterology

## 2021-09-23 ENCOUNTER — Other Ambulatory Visit: Payer: Self-pay

## 2021-09-23 VITALS — BP 120/51 | HR 70 | Temp 98.2°F | Resp 24 | Ht 60.0 in | Wt 132.0 lb

## 2021-09-23 DIAGNOSIS — Z86010 Personal history of colonic polyps: Secondary | ICD-10-CM | POA: Diagnosis not present

## 2021-09-23 DIAGNOSIS — K635 Polyp of colon: Secondary | ICD-10-CM

## 2021-09-23 DIAGNOSIS — D120 Benign neoplasm of cecum: Secondary | ICD-10-CM | POA: Diagnosis not present

## 2021-09-23 DIAGNOSIS — D123 Benign neoplasm of transverse colon: Secondary | ICD-10-CM

## 2021-09-23 MED ORDER — SODIUM CHLORIDE 0.9 % IV SOLN
500.0000 mL | Freq: Once | INTRAVENOUS | Status: DC
Start: 2021-09-23 — End: 2021-09-23

## 2021-09-23 NOTE — Op Note (Signed)
[facility] Patient Name: Cheryl Kent Procedure Date: 09/23/2021 9:06 AM MRN: [id number] Endoscopist: Viviann Spare P. Adela Lank , MD Age: 75 Referring MD:  Date of Birth: 05/30/1946 Gender: Female Account #: [id number] Procedure:                Colonoscopy Indications:              High risk colon cancer surveillance: Personal                            history of colonic polyps - 6 polyps removed                            05/2020, fair prep, history of rectal prolapse -                            took immodium with last prep, this time off                            antidiarrheals and prep and a half used Medicines:                Monitored Anesthesia Care Procedure:                Pre-Anesthesia Assessment:                           - Prior to the procedure, a History and Physical                            was performed, and patient medications and                            allergies were reviewed. The patient's tolerance of                            previous anesthesia was also reviewed. The risks                            and benefits of the procedure and the sedation                            options and risks were discussed with the patient.                            All questions were answered, and informed consent                            was obtained. Prior Anticoagulants: The patient has                            taken no previous anticoagulant or antiplatelet                            agents. ASA Grade Assessment: II - A patient with  mild systemic disease. After reviewing the risks                            and benefits, the patient was deemed in                            satisfactory condition to undergo the procedure.                           After obtaining informed consent, the colonoscope                            was passed under direct vision. Throughout the                            procedure, the patient's blood  pressure, pulse, and                            oxygen saturations were monitored continuously. The                            Olympus PCF-H190DL ([id number]) Colonoscope was                            introduced through the anus and advanced to the the                            cecum, identified by appendiceal orifice and                            ileocecal valve. The colonoscopy was technically                            difficult and complex due to unsatisfactory bowel                            prep. The patient tolerated the procedure well. The                            quality of the bowel preparation was fair. The                            ileocecal valve, appendiceal orifice, and rectum                            were photographed. Scope In: 9:28:30 AM Scope Out: 10:00:18 AM Scope Withdrawal Time: 0 hours 21 minutes 4 seconds  Total Procedure Duration: 0 hours 31 minutes 48 seconds  Findings:                 The perianal and digital rectal examinations were                            normal other than low tone. No overt prolapse  noted..                           A 7 mm polyp was found in the cecum. The polyp was                            sessile. The polyp was removed with a cold snare.                            Resection and retrieval were complete.                           A 7 mm polyp was found in the transverse colon. The                            polyp was flat. The polyp was removed with a cold                            snare. Resection and retrieval were complete.                           Multiple small-mouthed diverticula were found in                            the entire colon.                           Erythematous mucosa was found in the distal rectum                            consistent with known rectal prolapse.                           A large amount of semi-liquid stool was found in                            the entire  colon, making visualization difficult,                            worst in the cecum and other dependant portions.                            Lavage of the area was performed using copious                            amounts of sterile water, resulting in fair                            clearance with fair visualization. Cecal cap had a                            lot of seeds / nut fragments which clogged the  scope and prolonged the exam. No obvious polyps                            noted there or in other dependant areas that had                            similar findings, but small or flat polyps may not                            have been appreciated in these areas.                           The exam was otherwise without abnormality.                            Retroflexed views of the rectum not possible due to                            small size and poor air retention. Complications:            No immediate complications. Estimated blood loss:                            Minimal. Estimated Blood Loss:     Estimated blood loss was minimal. Impression:               - Preparation of the colon was fair as outlined                            above - some areas flat or small polyps may not                            have been appreciated despite several minutes                            lavaging the colon.                           - One 7 mm polyp in the cecum, removed with a cold                            snare. Resected and retrieved.                           - One 7 mm polyp in the transverse colon, removed                            with a cold snare. Resected and retrieved.                           - Diverticulosis in the entire examined colon.                           - Erythematous mucosa in the rectum  consistent with                            known prolapse change.                           - The examination was otherwise normal. Recommendation:            - Patient has a contact number available for                            emergencies. The signs and symptoms of potential                            delayed complications were discussed with the                            patient. Return to normal activities tomorrow.                            Written discharge instructions were provided to the                            patient.                           - Resume previous diet.                           - Continue present medications.                           - Await pathology results. Viviann Spare P. Caydon Feasel, MD 09/23/2021 10:08:46 AM This report has been signed electronically.

## 2021-09-23 NOTE — Progress Notes (Signed)
Called to room to assist during endoscopic procedure.  Patient ID and intended procedure confirmed with present staff. Received instructions for my participation in the procedure from the performing physician.  

## 2021-09-23 NOTE — Progress Notes (Signed)
Report given to PACU, vss 

## 2021-09-23 NOTE — Progress Notes (Signed)
History and Physical Interval Note: Patient here for colonoscopy given history of multiple colon polyps 05/2020 with fair prep noted. She was seen 9/28 without any interval changes since that time. Exam unchanged. She denies any new symptoms, otherwise feels well today. She wishes to proceed with colonoscopy.  09/23/2021 9:21 AM  Charleston Ropes  has presented today for endoscopic procedure(s), with the diagnosis of  Encounter Diagnosis  Name Primary?   Personal history of colonic polyps Yes  .  The various methods of evaluation and treatment have been discussed with the patient and/or family. After consideration of risks, benefits and other options for treatment, the patient has consented to  the endoscopic procedure(s).   The patient's history has been reviewed, patient examined, no change in status, stable for surgery.  I have reviewed the patient's chart and labs.  Questions were answered to the patient's satisfaction.    Jolly Mango, MD [facility]

## 2021-09-23 NOTE — Progress Notes (Signed)
0927 HR > 100 with esmolol 25 mg given IV, MD updated, vss

## 2021-09-23 NOTE — Patient Instructions (Signed)
Handouts provided on polyps and diverticulosis.  ? ?YOU HAD AN ENDOSCOPIC PROCEDURE TODAY AT THE [facility]:   Refer to the procedure report that was given to you for any specific questions about what was found during the examination.  If the procedure report does not answer your questions, please call your gastroenterologist to clarify.  If you requested that your care partner not be given the details of your procedure findings, then the procedure report has been included in a sealed envelope for you to review at your convenience later. ? ?YOU SHOULD EXPECT: Some feelings of bloating in the abdomen. Passage of more gas than usual.  Walking can help get rid of the air that was put into your GI tract during the procedure and reduce the bloating. If you had a lower endoscopy (such as a colonoscopy or flexible sigmoidoscopy) you may notice spotting of blood in your stool or on the toilet paper. If you underwent a bowel prep for your procedure, you may not have a normal bowel movement for a few days. ? ?Please Note:  You might notice some irritation and congestion in your nose or some drainage.  This is from the oxygen used during your procedure.  There is no need for concern and it should clear up in a day or so. ? ?SYMPTOMS TO REPORT IMMEDIATELY: ? ?Following lower endoscopy (colonoscopy or flexible sigmoidoscopy): ? Excessive amounts of blood in the stool ? Significant tenderness or worsening of abdominal pains ? Swelling of the abdomen that is new, acute ? Fever of 100?F or higher ? ?For urgent or emergent issues, a gastroenterologist can be reached at any hour by calling [phone]. ?Do not use MyChart messaging for urgent concerns.  ? ? ?DIET:  We do recommend a small meal at first, but then you may proceed to your regular diet.  Drink plenty of fluids but you should avoid alcoholic beverages for 24 hours. ? ?ACTIVITY:  You should plan to take it easy for the rest of today and you should NOT  DRIVE or use heavy machinery until tomorrow (because of the sedation medicines used during the test).   ? ?FOLLOW UP: ?Our staff will call the number listed on your records 48-72 hours following your procedure to check on you and address any questions or concerns that you may have regarding the information given to you following your procedure. If we do not reach you, we will leave a message.  We will attempt to reach you two times.  During this call, we will ask if you have developed any symptoms of COVID 19. If you develop any symptoms (ie: fever, flu-like symptoms, shortness of breath, cough etc.) before then, please call [phone].  If you test positive for Covid 19 in the 2 weeks post procedure, please call and report this information to us.   ? ?If any biopsies were taken you will be contacted by phone or by letter within the next 1-3 weeks.  Please call us at [phone] if you have not heard about the biopsies in 3 weeks.  ? ? ?SIGNATURES/CONFIDENTIALITY: ?You and/or your care partner have signed paperwork which will be entered into your electronic medical record.  These signatures attest to the fact that that the information above on your After Visit Summary has been reviewed and is understood.  Full responsibility of the confidentiality of this discharge information lies with you and/or your care-partner. ? ?

## 2021-09-23 NOTE — Progress Notes (Signed)
Pt's states no medical or surgical changes since previsit or office visit.   DT vitals and JK IV.

## 2021-09-27 ENCOUNTER — Telehealth: Payer: Self-pay

## 2021-09-27 NOTE — Telephone Encounter (Signed)
  Follow up Call-  Call back number 09/23/2021 09/17/2020 06/17/2020  Post procedure Call Back phone  # [phone]  Permission to leave phone message Yes Yes Yes  Some recent data might be hidden     Patient questions:  Do you have a fever, pain , or abdominal swelling? No. Pain Score  0 *  Have you tolerated food without any problems? Yes.    Have you been able to return to your normal activities? Yes.    Do you have any questions about your discharge instructions: Diet   No. Medications  No. Follow up visit  No.  Do you have questions or concerns about your Care? No.  Actions: * If pain score is 4 or above: No action needed, pain <4.  Have you developed a fever since your procedure? no  2.   Have you had an respiratory symptoms (SOB or cough) since your procedure? no  3.   Have you tested positive for COVID 19 since your procedure no  4.   Have you had any family members/close contacts diagnosed with the COVID 19 since your procedure?  no   If yes to any of these questions please route to Joylene John, RN and Joella Prince, RN

## 2021-10-12 DIAGNOSIS — R7303 Prediabetes: Secondary | ICD-10-CM | POA: Diagnosis not present

## 2021-10-12 DIAGNOSIS — I10 Essential (primary) hypertension: Secondary | ICD-10-CM | POA: Diagnosis not present

## 2021-10-12 DIAGNOSIS — E785 Hyperlipidemia, unspecified: Secondary | ICD-10-CM | POA: Diagnosis not present

## 2021-10-15 DIAGNOSIS — R61 Generalized hyperhidrosis: Secondary | ICD-10-CM | POA: Diagnosis not present

## 2021-10-15 DIAGNOSIS — K219 Gastro-esophageal reflux disease without esophagitis: Secondary | ICD-10-CM | POA: Diagnosis not present

## 2021-10-15 DIAGNOSIS — E785 Hyperlipidemia, unspecified: Secondary | ICD-10-CM | POA: Diagnosis not present

## 2021-10-15 DIAGNOSIS — R509 Fever, unspecified: Secondary | ICD-10-CM | POA: Diagnosis not present

## 2021-10-15 DIAGNOSIS — A150 Tuberculosis of lung: Secondary | ICD-10-CM | POA: Diagnosis not present

## 2021-10-15 DIAGNOSIS — R002 Palpitations: Secondary | ICD-10-CM | POA: Diagnosis not present

## 2021-10-15 DIAGNOSIS — M810 Age-related osteoporosis without current pathological fracture: Secondary | ICD-10-CM | POA: Diagnosis not present

## 2021-10-15 DIAGNOSIS — Z1331 Encounter for screening for depression: Secondary | ICD-10-CM | POA: Diagnosis not present

## 2021-10-15 DIAGNOSIS — K623 Rectal prolapse: Secondary | ICD-10-CM | POA: Diagnosis not present

## 2021-10-15 DIAGNOSIS — Z0000 Encounter for general adult medical examination without abnormal findings: Secondary | ICD-10-CM | POA: Diagnosis not present

## 2021-10-15 DIAGNOSIS — R7303 Prediabetes: Secondary | ICD-10-CM | POA: Diagnosis not present

## 2021-10-15 DIAGNOSIS — I10 Essential (primary) hypertension: Secondary | ICD-10-CM | POA: Diagnosis not present

## 2021-10-20 ENCOUNTER — Other Ambulatory Visit: Payer: Self-pay

## 2021-10-20 ENCOUNTER — Ambulatory Visit: Payer: PPO | Admitting: Internal Medicine

## 2021-10-20 VITALS — Ht 60.0 in | Wt 128.6 lb

## 2021-10-20 DIAGNOSIS — R509 Fever, unspecified: Secondary | ICD-10-CM | POA: Diagnosis not present

## 2021-10-20 NOTE — Progress Notes (Signed)
Patient: TIYAH ZELENAK  DOB: [birth year] MRN: [id number] PCP: Velna Hatchet, MD     Patient Active Problem List   Diagnosis Date Noted   Osteopenia of both hips 07/08/2018   Osteoarthritis of left knee 11/08/2016   Insomnia 08/22/2015   Slow transit constipation 08/22/2015   Rectal prolapse 08/22/2015   Alcoholism in recovery ([place]) 08/22/2015   History of prescription drug abuse 08/22/2015   Rectal mucosa prolapse 04/28/2015   Fecal incontinence 04/28/2015   PPD positive 11/24/2011   Hypercholesteremia 11/23/2011   HTN (hypertension) 11/23/2011   GERD (gastroesophageal reflux disease) 11/23/2011   GAD (generalized anxiety disorder) 11/23/2011     Subjective:  CATHE BILGER is a 75 y.o. F with Hx of latent tb SP INH+rifapentine qweekly x12 weeks completed in 2013 presents for fever and night sweats for the past 2 weeks. Referred to ID due to Hx of latent tb.  She was seen by Dr. Velna Hatchet, MD at [facility] for low-grade temps, night sweats for 10 days on 10/15/2021.  She reports since her symptoms started she took 3 COVID home tests which were negative. She generally fevers around 1-2 pm, fevers do not respond to NSAIDS/tylenol. She has to change clothes about 2-3 times a night due to night sweats. She reports her fevers and chills have been improving for the  last 2 to 3 days. Pt's last fever was 3 days ago, Tmax of 100. She has 8lb unintentional weight loss for the last 2 months. Denies any associated N,V,D chest pain, SHOB, rashes, HA, vision changes.  She was seen by Dr. Baxter Flattery in 2012 for latent tb as initial INH/B6 was d/c due elevated LFTs. She was treated with INH and rifapentine x12 weeks. She had a positive PPD/IGRA in June 2012. Chest x-ray which shows small calcified granuloma in the apex of the RUL.  At that time she had no associated cough, weight loss, fever/chills/night sweats. She reported Etoh abuse Hx and she she had abstained from Etoh from  the age of 23.  She states that she had short stays at homeless shelters(couple nights) and jail holding(<1 night) due to alcohol abuse. She had prior episodes of fever and night sweats over the years. Last episode  of fever/ night sweat was  in Decmeber 2022 for 3 days. She reports in 2012 she had night sweats for a couple months which subsided prior to treatment of latent tb.   Social: Animal exposure-pt has a cat 1.5 years. 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  Lives with her cat in townhome in [place] about 30 years x1PPD. Quit at 75 years of age COVID-received 2 pfizer vaccines ->Novovax on 09/03/21 Review of Systems  Constitutional:  Positive for fever and weight loss.  HENT: Negative.    Eyes: Negative.   Respiratory: Negative.    Cardiovascular: Negative.   Gastrointestinal: Negative.   Genitourinary: Negative.   Musculoskeletal: Negative.   Skin: Negative.   Neurological: Negative.   Endo/Heme/Allergies: Negative.   Psychiatric/Behavioral: Negative.     Past Medical History:  Diagnosis Date   Acid reflux    Alcoholism ([place])    per pt she has been sober for 21 years   Allergy    previous allergy shots.     Anxiety    Arthritis    DDD lumbar spine, hands B   Cataract    Depression    Fecal incontinence    GERD (gastroesophageal reflux disease)  Hemorrhoids    History of colon polyps    History of UTI    Hyperlipemia    Hypertension    IBS (irritable bowel syndrome)    Osteoarthritis of knee    Positive QuantiFERON-TB Gold test 12/19/2010   [place]/ID; treated three months.  2012.   Rectal prolapse    Rosacea conjunctivitis    Squamous cell carcinoma, face 07/05/2016   right forehead - CX3 + cautery + 5FU   Substance abuse ([place])    alcohol   Tuberculosis 2012    Outpatient Medications Prior to Visit  Medication Sig Dispense Refill   Calcium Carb-Cholecalciferol (CALCIUM 600 + D) 600-200 MG-UNIT TABS Take by mouth.     Cholecalciferol (VITAMIN  D) 2000 UNITS CAPS Take by mouth daily.     Coenzyme Q10 (COQ10) 100 MG CAPS Take by mouth daily.     Cyanocobalamin (VITAMIN B 12 PO) Take 1,000 mg by mouth daily.     famotidine (PEPCID) 20 MG tablet Take 1 tablet (20 mg total) by mouth at bedtime. 90 tablet 1   folic acid (FOLVITE) 573 MCG tablet Take 400 mcg by mouth daily.     lisinopril-hydrochlorothiazide (PRINZIDE,ZESTORETIC) 20-25 MG tablet Take 1 tablet by mouth daily. 90 tablet 3   Magnesium 300 MG CAPS Take by mouth daily.      methylcellulose (CITRUCEL) oral powder Use as directed daily     Multiple Vitamin (MULTIVITAMIN) tablet Take 1 tablet by mouth daily.     omeprazole (PRILOSEC) 20 MG capsule Take 1 capsule (20 mg total) by mouth daily before breakfast. Please keep your upcoming appointment on 9-28 for further refills. thanks 90 capsule 0   polyethylene glycol powder (GLYCOLAX/MIRALAX) powder Take 17 g by mouth daily. Use as needed for constipation 850 g 3   rosuvastatin (CRESTOR) 10 MG tablet Take 1 tablet (10 mg total) by mouth daily. 90 tablet 3   traZODone (DESYREL) 150 MG tablet Take 1 tablet (150 mg total) by mouth at bedtime. 90 tablet 3   No facility-administered medications prior to visit.     Allergies  Allergen Reactions   Penicillins Other (See Comments)    infant   Pneumococcal Vaccines    Sulfamethoxazole-Trimethoprim    Sulfonamide Derivatives     Social History   Tobacco Use   Smoking status: Former    Packs/day: 1.50    Years: 25.00    Pack years: 37.50    Types: Cigarettes    Quit date: 12/20/1983    Years since quitting: 37.8   Smokeless tobacco: Never  Vaping Use   Vaping Use: Never used  Substance Use Topics   Alcohol use: No    Alcohol/week: 0.0 standard drinks    Comment: former heavy use/alcoholism; quit in 1980 with treatment   Drug use: No    Comment: former abuse of prescription drugs during 1970s.    Family History  Problem Relation Age of Onset   Stroke Mother 40        multiple CVAs   Hypertension Mother    Hyperlipidemia Mother    Osteoporosis Mother    Mental illness Mother        paranoid schizophrenia   Heart disease Mother    Alzheimer's disease Father    Diabetes Father    Hypertension Father    Cancer Father        prostate   Osteoporosis Sister    Kidney disease Sister    Stroke Sister    Hyperlipidemia  Sister    Hypertension Sister    Mental illness Sister        depression; multiple behavior health admissions   Cancer Sister        leukemia   Heart disease Sister    Osteoporosis Maternal Grandmother    Stroke Maternal Grandmother    Heart disease Maternal Grandmother    Heart disease Maternal Grandfather    Osteoporosis Paternal Grandmother    Alzheimer's disease Paternal Grandfather    Cancer Cousin        MATERNAL IST COUSIN - BREAST CA   Colon cancer Neg Hx    Esophageal cancer Neg Hx    Rectal cancer Neg Hx    Stomach cancer Neg Hx    Breast cancer Neg Hx     Objective:  There were no vitals filed for this visit. There is no height or weight on file to calculate BMI.  Physical Exam Constitutional:      Appearance: Normal appearance.  HENT:     Head: Normocephalic and atraumatic.     Right Ear: Tympanic membrane normal.     Left Ear: Tympanic membrane normal.     Nose: Nose normal.     Mouth/Throat:     Mouth: Mucous membranes are moist.  Eyes:     Extraocular Movements: Extraocular movements intact.     Conjunctiva/sclera: Conjunctivae normal.     Pupils: Pupils are equal, round, and reactive to light.  Cardiovascular:     Rate and Rhythm: Normal rate and regular rhythm.     Heart sounds: No murmur heard.   No friction rub. No gallop.  Pulmonary:     Effort: Pulmonary effort is normal.     Breath sounds: Normal breath sounds.  Abdominal:     General: Abdomen is flat.     Palpations: Abdomen is soft.  Musculoskeletal:        General: Normal range of motion.  Skin:    General: Skin is warm and dry.   Neurological:     General: No focal deficit present.     Mental Status: She is alert and oriented to person, place, and time.  Psychiatric:        Mood and Affect: Mood normal.    Lab Results: Lab Results  Component Value Date   WBC 4.6 10/26/2018   HGB 13.8 10/26/2018   HCT 39.0 10/26/2018   MCV 100 (H) 10/26/2018   PLT 330 10/26/2018    Lab Results  Component Value Date   CREATININE 0.91 10/26/2018   BUN 10 10/26/2018   NA 140 10/26/2018   K 3.9 10/26/2018   CL 97 10/26/2018   CO2 27 10/26/2018    Lab Results  Component Value Date   ALT 22 10/26/2018   AST 25 10/26/2018   ALKPHOS 56 10/26/2018   BILITOT 0.7 10/26/2018   Imaging: CXR IMPRESSION:  Slight hyperinflation configuration.  Small calcified granuloma in  right hilar region. No active granulomatous process is seen.  No  evidence of active or cavitary tuberculosis.   Assessment & Plan:  #Fever of unknown origin Patient reports episodic fevers for the last 10 years.  Most notable were in 2012 when she had fever and night sweats for a couple of months which resolved on its own. This time her symptoms lasted about 2 weeks and are resolving. The differential for fever includes infectious versus malignancy versus rheumatoid etiology. Plan to obtain imaging CT chest abdomen pelvis to look for any foci of  infection versus overt malignancy.  Her presentation does not seem consistent with reactivation of latent tb as she had been appropriately treated. Also, given the the episodic nature of her symptoms tb reactivation is unlikely.  As a note, INH+rifapentine x3 months is comparable in efficacy to [place] x29months. She does have a history of smoking for about 30 years as such malignancy is possible.  Obtain blood cultures, afb fungal Cx, urine Cx to look for infectious etiology.  Further infectious work-up with HIV, RPR, Hepatitis serology, COVID testing.  Will include ANA and rheumatoid factor for rheumatological  work-up. Plan: -Follow-up in a month to discuss labs and monitor symptoms.   Laurice Record, MD [place] [facility]   10/20/21  12:11 PM

## 2021-10-21 ENCOUNTER — Telehealth: Payer: Self-pay | Admitting: Internal Medicine

## 2021-10-21 ENCOUNTER — Other Ambulatory Visit: Payer: Self-pay

## 2021-10-21 ENCOUNTER — Other Ambulatory Visit: Payer: PPO

## 2021-10-21 DIAGNOSIS — R509 Fever, unspecified: Secondary | ICD-10-CM | POA: Diagnosis not present

## 2021-10-21 LAB — URINALYSIS, ROUTINE W REFLEX MICROSCOPIC
Bilirubin Urine: NEGATIVE
Glucose, UA: NEGATIVE
Hgb urine dipstick: NEGATIVE
Ketones, ur: NEGATIVE
Leukocytes,Ua: NEGATIVE
Nitrite: NEGATIVE
Protein, ur: NEGATIVE
Specific Gravity, Urine: 1.005 (ref 1.001–1.035)
pH: 7 (ref 5.0–8.0)

## 2021-10-21 LAB — SARS-COV-2 RNA,(COVID-19) QUALITATIVE NAAT: SARS CoV2 RNA: NOT DETECTED

## 2021-10-21 LAB — C-REACTIVE PROTEIN: CRP: 19.9 mg/L — ABNORMAL HIGH (ref <8.0)

## 2021-10-21 LAB — URINE CULTURE
MICRO NUMBER:: 12585110
Result:: NO GROWTH
SPECIMEN QUALITY:: ADEQUATE

## 2021-10-21 LAB — RHEUMATOID FACTOR: Rheumatoid fact SerPl-aCnc: <14 IU/mL (ref <14)

## 2021-10-21 LAB — SEDIMENTATION RATE: Sed Rate: 129 mm/h — ABNORMAL HIGH (ref 0–30)

## 2021-10-21 LAB — LACTATE DEHYDROGENASE: LDH: 193 U/L (ref 120–250)

## 2021-10-21 NOTE — Telephone Encounter (Signed)
Called pt to inform to stop by lab.to draw AFB blood Cx.  Pt agreed and plans to stop by either today or tomorrow

## 2021-10-21 NOTE — Addendum Note (Signed)
Addended by: Caffie Pinto on: 10/21/2021 04:31 PM   Modules accepted: Orders

## 2021-10-22 LAB — COMPLETE METABOLIC PANEL WITH GFR
AG Ratio: 1.3 (calc) (ref 1.0–2.5)
ALT: 15 U/L (ref 6–29)
AST: 19 U/L (ref 10–35)
Albumin: 3.8 g/dL (ref 3.6–5.1)
Alkaline phosphatase (APISO): 77 U/L (ref 37–153)
BUN: 17 mg/dL (ref 7–25)
CO2: 31 mmol/L (ref 20–32)
Calcium: 9.9 mg/dL (ref 8.6–10.4)
Chloride: 97 mmol/L — ABNORMAL LOW (ref 98–110)
Creat: 0.86 mg/dL (ref 0.60–1.00)
Globulin: 2.9 g/dL (calc) (ref 1.9–3.7)
Glucose, Bld: 101 mg/dL — ABNORMAL HIGH (ref 65–99)
Potassium: 4.7 mmol/L (ref 3.5–5.3)
Sodium: 138 mmol/L (ref 135–146)
Total Bilirubin: 0.4 mg/dL (ref 0.2–1.2)
Total Protein: 6.7 g/dL (ref 6.1–8.1)
eGFR: 70 mL/min/{1.73_m2} (ref >=60)

## 2021-10-22 LAB — CBC WITH DIFFERENTIAL/PLATELET
Absolute Monocytes: 628 cells/uL (ref 200–950)
Basophils Absolute: 36 cells/uL (ref 0–200)
Basophils Relative: 0.4 %
Eosinophils Absolute: 82 cells/uL (ref 15–500)
Eosinophils Relative: 0.9 %
HCT: 36.6 % (ref 35.0–45.0)
Hemoglobin: 12.3 g/dL (ref 11.7–15.5)
Lymphs Abs: 1802 cells/uL (ref 850–3900)
MCH: 31.4 pg (ref 27.0–33.0)
MCHC: 33.6 g/dL (ref 32.0–36.0)
MCV: 93.4 fL (ref 80.0–100.0)
MPV: 8.4 fL (ref 7.5–12.5)
Monocytes Relative: 6.9 %
Neutro Abs: 6552 cells/uL (ref 1500–7800)
Neutrophils Relative %: 72 %
Platelets: 494 10*3/uL — ABNORMAL HIGH (ref 140–400)
RBC: 3.92 10*6/uL (ref 3.80–5.10)
RDW: 12 % (ref 11.0–15.0)
Total Lymphocyte: 19.8 %
WBC: 9.1 10*3/uL (ref 3.8–10.8)

## 2021-10-22 LAB — ANA, IFA COMPREHENSIVE PANEL
Anti Nuclear Antibody (ANA): NEGATIVE
ENA SM Ab Ser-aCnc: <1 AI
SM/RNP: <1 AI
SSA (Ro) (ENA) Antibody, IgG: <1 AI
SSB (La) (ENA) Antibody, IgG: <1 AI
Scleroderma (Scl-70) (ENA) Antibody, IgG: <1 AI
ds DNA Ab: <1 IU/mL

## 2021-10-22 LAB — HEPATITIS B SURFACE ANTIGEN: Hepatitis B Surface Ag: NONREACTIVE

## 2021-10-22 LAB — HEPATITIS B CORE ANTIBODY, IGM: Hep B C IgM: NONREACTIVE

## 2021-10-22 LAB — HEPATITIS A ANTIBODY, TOTAL: Hepatitis A AB,Total: NONREACTIVE

## 2021-10-22 LAB — HEPATITIS C ANTIBODY
Hepatitis C Ab: NONREACTIVE
SIGNAL TO CUT-OFF: 0.06 (ref <1.00)

## 2021-10-22 LAB — HEPATITIS B SURFACE ANTIBODY,QUALITATIVE: Hep B S Ab: NONREACTIVE

## 2021-10-22 LAB — HIV ANTIBODY (ROUTINE TESTING W REFLEX): HIV 1&2 Ab, 4th Generation: NONREACTIVE

## 2021-10-22 LAB — SYPHILIS: RPR W/REFLEX TO RPR TITER AND TREPONEMAL ANTIBODIES, TRADITIONAL SCREENING AND DIAGNOSIS ALGORITHM: RPR Ser Ql: NONREACTIVE

## 2021-10-25 LAB — CULTURE, BLOOD (SINGLE)
MICRO NUMBER:: 12584574
MICRO NUMBER:: 12584575
Result:: NO GROWTH
Result:: NO GROWTH
SPECIMEN QUALITY:: ADEQUATE
SPECIMEN QUALITY:: ADEQUATE

## 2021-10-26 ENCOUNTER — Other Ambulatory Visit: Payer: Self-pay

## 2021-10-26 ENCOUNTER — Ambulatory Visit (HOSPITAL_COMMUNITY)
Admission: RE | Admit: 2021-10-26 | Discharge: 2021-10-26 | Disposition: A | Discharge status: Home / Self Care | Payer: PPO | Source: Ambulatory Visit | Attending: Internal Medicine | Admitting: Internal Medicine

## 2021-10-26 DIAGNOSIS — R509 Fever, unspecified: Secondary | ICD-10-CM | POA: Diagnosis not present

## 2021-10-26 DIAGNOSIS — K449 Diaphragmatic hernia without obstruction or gangrene: Secondary | ICD-10-CM | POA: Diagnosis not present

## 2021-10-26 DIAGNOSIS — K5730 Diverticulosis of large intestine without perforation or abscess without bleeding: Secondary | ICD-10-CM | POA: Insufficient documentation

## 2021-10-26 DIAGNOSIS — N281 Cyst of kidney, acquired: Secondary | ICD-10-CM | POA: Diagnosis not present

## 2021-10-26 MED ORDER — IOHEXOL 350 MG/ML SOLN
60.0000 mL | Freq: Once | INTRAVENOUS | Status: AC | PRN
  Administered 2021-10-26: 60 mL via INTRAVENOUS

## 2021-11-13 ENCOUNTER — Other Ambulatory Visit: Payer: Self-pay | Admitting: Gastroenterology

## 2021-11-13 DIAGNOSIS — K219 Gastro-esophageal reflux disease without esophagitis: Secondary | ICD-10-CM

## 2021-11-19 ENCOUNTER — Other Ambulatory Visit: Payer: Self-pay

## 2021-11-19 ENCOUNTER — Ambulatory Visit: Payer: PPO | Admitting: Internal Medicine

## 2021-11-19 ENCOUNTER — Encounter: Payer: Self-pay | Admitting: Internal Medicine

## 2021-11-19 VITALS — BP 144/88 | HR 98 | Temp 98.1°F | Wt 131.0 lb

## 2021-11-19 DIAGNOSIS — R509 Fever, unspecified: Secondary | ICD-10-CM

## 2021-11-19 NOTE — Progress Notes (Signed)
Patient: Cheryl Kent  DOB: 1946/10/23 MRN: [id number] PCP: Velna Hatchet, MD    Chief Complaint  Patient presents with   Follow-up    Night sweats stopped 3-4 days after last visit.      Patient Active Problem List   Diagnosis Date Noted   Osteopenia of both hips 07/08/2018   Osteoarthritis of left knee 11/08/2016   Insomnia 08/22/2015   Slow transit constipation 08/22/2015   Rectal prolapse 08/22/2015   Alcoholism in recovery ([place]) 08/22/2015   History of prescription drug abuse 08/22/2015   Rectal mucosa prolapse 04/28/2015   Fecal incontinence 04/28/2015   PPD positive 11/24/2011   Hypercholesteremia 11/23/2011   HTN (hypertension) 11/23/2011   GERD (gastroesophageal reflux disease) 11/23/2011   GAD (generalized anxiety disorder) 11/23/2011     Subjective:  Cheryl Kent is a 75 y.o. Hx of latent tb SP INH+rifapentine qweekly x12 weeks completed in 2013 presented for evaluation of fever and night sweats. Patient had episodic fevers for the last 10 years.  Most notable were in 2012 when she had fever and night sweats for a couple of months which resolved on its own. The latest episode lasted for about 2.5 weeks. Seen by ID to evaluate for etiology. Work up was unrevealing including blood/afb Cx, CT chest/abdomen/pelvis, ANA, RF, Hepa Panel. She did have elevated esr, crp.  Today, she reports feeling well. He fever and night seats have resoled. No new complaints.   Review of Systems  All other systems reviewed and are negative.  Past Medical History:  Diagnosis Date   Acid reflux    Alcoholism ([employer])    per pt she has been sober for 21 years   Allergy    previous allergy shots.     Anxiety    Arthritis    DDD lumbar spine, hands B   Cataract    Depression    Fecal incontinence    GERD (gastroesophageal reflux disease)    Hemorrhoids    History of colon polyps    History of UTI    Hyperlipemia    Hypertension    IBS (irritable bowel syndrome)     Osteoarthritis of knee    Positive QuantiFERON-TB Gold test 12/19/2010   Snider/ID; treated three months.  2012.   Rectal prolapse    Rosacea conjunctivitis    Squamous cell carcinoma, face 07/05/2016   right forehead - CX3 + cautery + 5FU   Substance abuse ([place])    alcohol   Tuberculosis 2012    Outpatient Medications Prior to Visit  Medication Sig Dispense Refill   Calcium Carb-Cholecalciferol (CALCIUM 600 + D) 600-200 MG-UNIT TABS Take by mouth.     Cholecalciferol (VITAMIN D) 2000 UNITS CAPS Take by mouth daily.     Coenzyme Q10 (COQ10) 100 MG CAPS Take by mouth daily.     Cyanocobalamin (VITAMIN B 12 PO) Take 1,000 mg by mouth daily.     famotidine (PEPCID) 20 MG tablet Take 1 tablet (20 mg total) by mouth at bedtime. 90 tablet 1   folic acid (FOLVITE) 696 MCG tablet Take 400 mcg by mouth daily.     lisinopril-hydrochlorothiazide (PRINZIDE,ZESTORETIC) 20-25 MG tablet Take 1 tablet by mouth daily. 90 tablet 3   Magnesium 300 MG CAPS Take by mouth daily.      methylcellulose (CITRUCEL) oral powder Use as directed daily     Multiple Vitamin (MULTIVITAMIN) tablet Take 1 tablet by mouth daily.     omeprazole (PRILOSEC) 20  MG capsule Take 1 capsule (20 mg total) by mouth daily before breakfast. 90 capsule 1   polyethylene glycol powder (GLYCOLAX/MIRALAX) powder Take 17 g by mouth daily. Use as needed for constipation 850 g 3   rosuvastatin (CRESTOR) 10 MG tablet Take 1 tablet (10 mg total) by mouth daily. 90 tablet 3   traZODone (DESYREL) 150 MG tablet Take 1 tablet (150 mg total) by mouth at bedtime. 90 tablet 3   No facility-administered medications prior to visit.     Allergies  Allergen Reactions   Penicillins Other (See Comments)    infant   Pneumococcal Vaccines    Sulfamethoxazole-Trimethoprim    Sulfonamide Derivatives     Social History   Tobacco Use   Smoking status: Former    Packs/day: 1.50    Years: 25.00    Pack years: 37.50    Types: Cigarettes    Quit  date: 12/20/1983    Years since quitting: 37.9   Smokeless tobacco: Never  Vaping Use   Vaping Use: Never used  Substance Use Topics   Alcohol use: No    Alcohol/week: 0.0 standard drinks    Comment: former heavy use/alcoholism; quit in 1980 with treatment   Drug use: No    Comment: former abuse of prescription drugs during 1970s.    Family History  Problem Relation Age of Onset   Stroke Mother 31       multiple CVAs   Hypertension Mother    Hyperlipidemia Mother    Osteoporosis Mother    Mental illness Mother        paranoid schizophrenia   Heart disease Mother    Alzheimer's disease Father    Diabetes Father    Hypertension Father    Cancer Father        prostate   Osteoporosis Sister    Kidney disease Sister    Stroke Sister    Hyperlipidemia Sister    Hypertension Sister    Mental illness Sister        depression; multiple behavior health admissions   Cancer Sister        leukemia   Heart disease Sister    Osteoporosis Maternal Grandmother    Stroke Maternal Grandmother    Heart disease Maternal Grandmother    Heart disease Maternal Grandfather    Osteoporosis Paternal Grandmother    Alzheimer's disease Paternal Grandfather    Cancer Cousin        MATERNAL IST COUSIN - BREAST CA   Colon cancer Neg Hx    Esophageal cancer Neg Hx    Rectal cancer Neg Hx    Stomach cancer Neg Hx    Breast cancer Neg Hx     Objective:   Vitals:   11/19/21 1437  BP: (!) 144/88  Pulse: 98  Temp: 98.1 F (36.7 C)  TempSrc: Oral  SpO2: 95%  Weight: 131 lb (59.4 kg)   Body mass index is 25.58 kg/m.  Physical Exam Constitutional:      Appearance: Normal appearance.  HENT:     Head: Normocephalic and atraumatic.     Right Ear: Tympanic membrane normal.     Left Ear: Tympanic membrane normal.     Nose: Nose normal.     Mouth/Throat:     Mouth: Mucous membranes are moist.  Eyes:     Extraocular Movements: Extraocular movements intact.     Conjunctiva/sclera:  Conjunctivae normal.     Pupils: Pupils are equal, round, and reactive to light.  Cardiovascular:     Rate and Rhythm: Normal rate and regular rhythm.     Heart sounds: No murmur heard.   No friction rub. No gallop.  Pulmonary:     Effort: Pulmonary effort is normal.     Breath sounds: Normal breath sounds.  Abdominal:     General: Abdomen is flat.     Palpations: Abdomen is soft.  Musculoskeletal:        General: Normal range of motion.  Skin:    General: Skin is warm and dry.  Neurological:     General: No focal deficit present.     Mental Status: She is alert and oriented to person, place, and time.  Psychiatric:        Mood and Affect: Mood normal.    Lab Results: Lab Results  Component Value Date   WBC 9.1 10/20/2021   HGB 12.3 10/20/2021   HCT 36.6 10/20/2021   MCV 93.4 10/20/2021   PLT 494 (H) 10/20/2021    Lab Results  Component Value Date   CREATININE 0.86 10/20/2021   BUN 17 10/20/2021   NA 138 10/20/2021   K 4.7 10/20/2021   CL 97 (L) 10/20/2021   CO2 31 10/20/2021    Lab Results  Component Value Date   ALT 15 10/20/2021   AST 19 10/20/2021   ALKPHOS 56 10/26/2018   BILITOT 0.4 10/20/2021     Assessment & Plan:   Problem List Items Addressed This Visit   None Visit Diagnoses     FUO (fever of unknown origin)    -  Primary   Relevant Orders   CBC with Differential/Platelet   Sed Rate (ESR)   CRP (C-Reactive Protein)   Protime-INR      #FUO -Symptoms now resolved(fever/nightsweats). We reviewed labs and imaging today. Besides elevated esr/crp no other labs were revealing. -Follow-up with GI(colonoscopy in 2022 had concern for insufficient prep). Pt reports she was planning on calling GI.  -baseline labs today -Follow-up PRN  Laurice Record, MD [place] [facility]   11/19/21  3:12 PM

## 2021-11-20 LAB — CBC WITH DIFFERENTIAL/PLATELET
Absolute Monocytes: 675 {cells}/uL (ref 200–950)
Basophils Absolute: 57 {cells}/uL (ref 0–200)
Basophils Relative: 0.6 %
Eosinophils Absolute: 57 {cells}/uL (ref 15–500)
Eosinophils Relative: 0.6 %
HCT: 35.5 % (ref 35.0–45.0)
Hemoglobin: 11.9 g/dL (ref 11.7–15.5)
Lymphs Abs: 1739 {cells}/uL (ref 850–3900)
MCH: 32.1 pg (ref 27.0–33.0)
MCHC: 33.5 g/dL (ref 32.0–36.0)
MCV: 95.7 fL (ref 80.0–100.0)
MPV: 8.7 fL (ref 7.5–12.5)
Monocytes Relative: 7.1 %
Neutro Abs: 6973 {cells}/uL (ref 1500–7800)
Neutrophils Relative %: 73.4 %
Platelets: 353 10*3/uL (ref 140–400)
RBC: 3.71 Million/uL — ABNORMAL LOW (ref 3.80–5.10)
RDW: 12.5 % (ref 11.0–15.0)
Total Lymphocyte: 18.3 %
WBC: 9.5 10*3/uL (ref 3.8–10.8)

## 2021-11-20 LAB — PROTIME-INR
INR: 0.9
Prothrombin Time: 9.7 s (ref 9.0–11.5)

## 2021-11-20 LAB — SEDIMENTATION RATE: Sed Rate: 79 mm/h — ABNORMAL HIGH (ref 0–30)

## 2021-11-20 LAB — C-REACTIVE PROTEIN: CRP: 5.8 mg/L (ref <8.0)

## 2021-12-08 ENCOUNTER — Ambulatory Visit: Payer: PPO | Admitting: Physician Assistant

## 2021-12-09 LAB — AFB CULTURE, BLOOD
MICRO NUMBER:: 12594050
MICRO NUMBER:: 12594051
SPECIMEN QUALITY:: ADEQUATE
SPECIMEN QUALITY:: ADEQUATE

## 2022-01-12 ENCOUNTER — Other Ambulatory Visit: Payer: Self-pay | Admitting: Internal Medicine

## 2022-01-21 ENCOUNTER — Ambulatory Visit
Admission: RE | Admit: 2022-01-21 | Discharge: 2022-01-21 | Disposition: A | Discharge status: Home / Self Care | Payer: PPO | Source: Ambulatory Visit | Attending: Family Medicine | Admitting: Family Medicine

## 2022-01-21 DIAGNOSIS — M810 Age-related osteoporosis without current pathological fracture: Secondary | ICD-10-CM

## 2022-01-21 DIAGNOSIS — M8589 Other specified disorders of bone density and structure, multiple sites: Secondary | ICD-10-CM | POA: Diagnosis not present

## 2022-01-21 DIAGNOSIS — Z780 Asymptomatic menopausal state: Secondary | ICD-10-CM | POA: Diagnosis not present

## 2022-01-26 DIAGNOSIS — H40013 Open angle with borderline findings, low risk, bilateral: Secondary | ICD-10-CM | POA: Diagnosis not present

## 2022-01-26 DIAGNOSIS — H524 Presbyopia: Secondary | ICD-10-CM | POA: Diagnosis not present

## 2022-01-26 DIAGNOSIS — H43813 Vitreous degeneration, bilateral: Secondary | ICD-10-CM | POA: Diagnosis not present

## 2022-01-26 DIAGNOSIS — H04123 Dry eye syndrome of bilateral lacrimal glands: Secondary | ICD-10-CM | POA: Diagnosis not present

## 2022-01-26 DIAGNOSIS — H26493 Other secondary cataract, bilateral: Secondary | ICD-10-CM | POA: Diagnosis not present

## 2022-02-15 DIAGNOSIS — M810 Age-related osteoporosis without current pathological fracture: Secondary | ICD-10-CM | POA: Diagnosis not present

## 2022-02-15 DIAGNOSIS — I10 Essential (primary) hypertension: Secondary | ICD-10-CM | POA: Diagnosis not present

## 2022-02-15 DIAGNOSIS — E785 Hyperlipidemia, unspecified: Secondary | ICD-10-CM | POA: Diagnosis not present

## 2022-02-15 DIAGNOSIS — Z227 Latent tuberculosis: Secondary | ICD-10-CM | POA: Diagnosis not present

## 2022-02-15 DIAGNOSIS — R002 Palpitations: Secondary | ICD-10-CM | POA: Diagnosis not present

## 2022-02-15 DIAGNOSIS — R7303 Prediabetes: Secondary | ICD-10-CM | POA: Diagnosis not present

## 2022-02-15 DIAGNOSIS — K623 Rectal prolapse: Secondary | ICD-10-CM | POA: Diagnosis not present

## 2022-02-15 DIAGNOSIS — K219 Gastro-esophageal reflux disease without esophagitis: Secondary | ICD-10-CM | POA: Diagnosis not present

## 2022-03-08 DIAGNOSIS — H26491 Other secondary cataract, right eye: Secondary | ICD-10-CM | POA: Diagnosis not present

## 2022-05-07 ENCOUNTER — Other Ambulatory Visit: Payer: Self-pay | Admitting: Gastroenterology

## 2022-05-07 DIAGNOSIS — K219 Gastro-esophageal reflux disease without esophagitis: Secondary | ICD-10-CM

## 2022-06-14 ENCOUNTER — Other Ambulatory Visit: Payer: Self-pay | Admitting: Family Medicine

## 2022-06-14 ENCOUNTER — Other Ambulatory Visit: Payer: Self-pay | Admitting: Internal Medicine

## 2022-06-14 DIAGNOSIS — Z1231 Encounter for screening mammogram for malignant neoplasm of breast: Secondary | ICD-10-CM

## 2022-06-15 DIAGNOSIS — S61051A Open bite of right thumb without damage to nail, initial encounter: Secondary | ICD-10-CM | POA: Diagnosis not present

## 2022-06-15 DIAGNOSIS — W5501XA Bitten by cat, initial encounter: Secondary | ICD-10-CM | POA: Diagnosis not present

## 2022-06-29 ENCOUNTER — Encounter: Payer: Self-pay | Admitting: Physician Assistant

## 2022-06-29 ENCOUNTER — Ambulatory Visit: Payer: PPO | Admitting: Physician Assistant

## 2022-06-29 DIAGNOSIS — L430 Hypertrophic lichen planus: Secondary | ICD-10-CM | POA: Diagnosis not present

## 2022-06-29 DIAGNOSIS — Z1283 Encounter for screening for malignant neoplasm of skin: Secondary | ICD-10-CM | POA: Diagnosis not present

## 2022-06-29 DIAGNOSIS — D485 Neoplasm of uncertain behavior of skin: Secondary | ICD-10-CM | POA: Diagnosis not present

## 2022-06-29 DIAGNOSIS — L821 Other seborrheic keratosis: Secondary | ICD-10-CM | POA: Diagnosis not present

## 2022-06-29 NOTE — Patient Instructions (Signed)

## 2022-07-11 ENCOUNTER — Telehealth: Payer: Self-pay

## 2022-07-11 NOTE — Telephone Encounter (Signed)
-----   Message from Warren Danes, [place] sent at 07/07/2022  4:54 PM EDT ----- benign

## 2022-07-11 NOTE — Telephone Encounter (Signed)
Phone call to patient with her pathology results. Voicemail left for patient to give the office a call back.

## 2022-07-11 NOTE — Telephone Encounter (Signed)
Phone call from patient returning our call. Pathology results given to patient.

## 2022-07-21 ENCOUNTER — Encounter: Payer: Self-pay | Admitting: Physician Assistant

## 2022-07-21 NOTE — Progress Notes (Signed)
   Follow-Up Visit   Subjective  Cheryl Kent is a 76 y.o. female who presents for the following: Skin Problem (Patient here today for lesion on her right collarbone x few weeks that stays red, no bleeding no pain. ).   The following portions of the chart were reviewed this encounter and updated as appropriate:  Tobacco  Allergies  Meds  Problems  Med Hx  Surg Hx  Fam Hx      Objective  Well appearing patient in no apparent distress; mood and affect are within normal limits.  All skin waist up examined.  No atypical nevi noted at the time of the visit.   right neck Stuck-on, waxy papules and plaques.   right chest Brown crust        Assessment & Plan  Seborrheic keratosis right neck  observe  Neoplasm of uncertain behavior of skin right chest  Skin / nail biopsy Type of biopsy: tangential   Informed consent: discussed and consent obtained   Timeout: patient name, date of birth, surgical site, and procedure verified   Procedure prep:  Patient was prepped and draped in usual sterile fashion (Non sterile) Prep type:  Chlorhexidine Anesthesia: the lesion was anesthetized in a standard fashion   Anesthetic:  1% lidocaine w/ epinephrine 1-100,000 local infiltration Instrument used: flexible razor blade   Outcome: patient tolerated procedure well   Post-procedure details: wound care instructions given    Specimen 1 - Surgical pathology Differential Diagnosis: bcc vs scc  Check Margins: yes  Encounter for screening for malignant neoplasm of skin  Yearly skin examinations    I, Thelbert Gartin, PA-C, have reviewed all documentation's for this visit.  The documentation on 07/21/22 for the exam, diagnosis, procedures and orders are all accurate and complete.

## 2022-07-25 ENCOUNTER — Ambulatory Visit
Admission: RE | Admit: 2022-07-25 | Discharge: 2022-07-25 | Disposition: A | Discharge status: Home / Self Care | Payer: PPO | Source: Ambulatory Visit | Attending: Internal Medicine | Admitting: Internal Medicine

## 2022-07-25 DIAGNOSIS — Z1231 Encounter for screening mammogram for malignant neoplasm of breast: Secondary | ICD-10-CM | POA: Diagnosis not present

## 2022-08-23 ENCOUNTER — Ambulatory Visit: Payer: PPO | Admitting: Physician Assistant

## 2022-09-24 DIAGNOSIS — Z23 Encounter for immunization: Secondary | ICD-10-CM | POA: Diagnosis not present

## 2022-10-04 DIAGNOSIS — L821 Other seborrheic keratosis: Secondary | ICD-10-CM | POA: Diagnosis not present

## 2022-10-04 DIAGNOSIS — R202 Paresthesia of skin: Secondary | ICD-10-CM | POA: Diagnosis not present

## 2022-10-04 DIAGNOSIS — D229 Melanocytic nevi, unspecified: Secondary | ICD-10-CM | POA: Diagnosis not present

## 2022-10-04 DIAGNOSIS — L814 Other melanin hyperpigmentation: Secondary | ICD-10-CM | POA: Diagnosis not present

## 2022-10-04 DIAGNOSIS — L578 Other skin changes due to chronic exposure to nonionizing radiation: Secondary | ICD-10-CM | POA: Diagnosis not present

## 2022-10-19 DIAGNOSIS — R7303 Prediabetes: Secondary | ICD-10-CM | POA: Diagnosis not present

## 2022-10-19 DIAGNOSIS — I10 Essential (primary) hypertension: Secondary | ICD-10-CM | POA: Diagnosis not present

## 2022-10-19 DIAGNOSIS — Z9849 Cataract extraction status, unspecified eye: Secondary | ICD-10-CM | POA: Diagnosis not present

## 2022-10-19 DIAGNOSIS — M8580 Other specified disorders of bone density and structure, unspecified site: Secondary | ICD-10-CM | POA: Diagnosis not present

## 2022-10-19 DIAGNOSIS — K5900 Constipation, unspecified: Secondary | ICD-10-CM | POA: Diagnosis not present

## 2022-10-19 DIAGNOSIS — E785 Hyperlipidemia, unspecified: Secondary | ICD-10-CM | POA: Diagnosis not present

## 2022-10-19 DIAGNOSIS — Z87891 Personal history of nicotine dependence: Secondary | ICD-10-CM | POA: Diagnosis not present

## 2022-10-19 DIAGNOSIS — G4700 Insomnia, unspecified: Secondary | ICD-10-CM | POA: Diagnosis not present

## 2022-10-19 DIAGNOSIS — E663 Overweight: Secondary | ICD-10-CM | POA: Diagnosis not present

## 2022-10-19 DIAGNOSIS — R7989 Other specified abnormal findings of blood chemistry: Secondary | ICD-10-CM | POA: Diagnosis not present

## 2022-10-19 DIAGNOSIS — K219 Gastro-esophageal reflux disease without esophagitis: Secondary | ICD-10-CM | POA: Diagnosis not present

## 2022-10-19 DIAGNOSIS — M810 Age-related osteoporosis without current pathological fracture: Secondary | ICD-10-CM | POA: Diagnosis not present

## 2022-10-26 DIAGNOSIS — Z1339 Encounter for screening examination for other mental health and behavioral disorders: Secondary | ICD-10-CM | POA: Diagnosis not present

## 2022-10-26 DIAGNOSIS — E785 Hyperlipidemia, unspecified: Secondary | ICD-10-CM | POA: Diagnosis not present

## 2022-10-26 DIAGNOSIS — I10 Essential (primary) hypertension: Secondary | ICD-10-CM | POA: Diagnosis not present

## 2022-10-26 DIAGNOSIS — K219 Gastro-esophageal reflux disease without esophagitis: Secondary | ICD-10-CM | POA: Diagnosis not present

## 2022-10-26 DIAGNOSIS — R82998 Other abnormal findings in urine: Secondary | ICD-10-CM | POA: Diagnosis not present

## 2022-10-26 DIAGNOSIS — Z227 Latent tuberculosis: Secondary | ICD-10-CM | POA: Diagnosis not present

## 2022-10-26 DIAGNOSIS — Z0000 Encounter for general adult medical examination without abnormal findings: Secondary | ICD-10-CM | POA: Diagnosis not present

## 2022-10-26 DIAGNOSIS — M810 Age-related osteoporosis without current pathological fracture: Secondary | ICD-10-CM | POA: Diagnosis not present

## 2022-10-26 DIAGNOSIS — K623 Rectal prolapse: Secondary | ICD-10-CM | POA: Diagnosis not present

## 2022-10-26 DIAGNOSIS — Z23 Encounter for immunization: Secondary | ICD-10-CM | POA: Diagnosis not present

## 2022-10-26 DIAGNOSIS — R002 Palpitations: Secondary | ICD-10-CM | POA: Diagnosis not present

## 2022-10-26 DIAGNOSIS — Z1331 Encounter for screening for depression: Secondary | ICD-10-CM | POA: Diagnosis not present

## 2022-10-26 DIAGNOSIS — R7303 Prediabetes: Secondary | ICD-10-CM | POA: Diagnosis not present

## 2022-10-31 ENCOUNTER — Other Ambulatory Visit: Payer: Self-pay | Admitting: Gastroenterology

## 2022-10-31 DIAGNOSIS — K219 Gastro-esophageal reflux disease without esophagitis: Secondary | ICD-10-CM

## 2022-11-22 ENCOUNTER — Other Ambulatory Visit: Payer: Self-pay | Admitting: Gastroenterology

## 2022-11-22 DIAGNOSIS — K219 Gastro-esophageal reflux disease without esophagitis: Secondary | ICD-10-CM

## 2022-12-27 ENCOUNTER — Ambulatory Visit: Payer: PPO | Admitting: Gastroenterology

## 2023-01-24 DIAGNOSIS — H35032 Hypertensive retinopathy, left eye: Secondary | ICD-10-CM | POA: Diagnosis not present

## 2023-01-24 DIAGNOSIS — H04123 Dry eye syndrome of bilateral lacrimal glands: Secondary | ICD-10-CM | POA: Diagnosis not present

## 2023-01-24 DIAGNOSIS — H401121 Primary open-angle glaucoma, left eye, mild stage: Secondary | ICD-10-CM | POA: Diagnosis not present

## 2023-01-24 DIAGNOSIS — H524 Presbyopia: Secondary | ICD-10-CM | POA: Diagnosis not present

## 2023-01-24 DIAGNOSIS — H40011 Open angle with borderline findings, low risk, right eye: Secondary | ICD-10-CM | POA: Diagnosis not present

## 2023-02-13 ENCOUNTER — Encounter: Payer: Self-pay | Admitting: Gastroenterology

## 2023-02-13 ENCOUNTER — Ambulatory Visit: Payer: PPO | Admitting: Gastroenterology

## 2023-02-13 VITALS — BP 118/82 | HR 84 | Ht 60.0 in | Wt 130.0 lb

## 2023-02-13 DIAGNOSIS — K5909 Other constipation: Secondary | ICD-10-CM

## 2023-02-13 DIAGNOSIS — Z86010 Personal history of colonic polyps: Secondary | ICD-10-CM | POA: Diagnosis not present

## 2023-02-13 DIAGNOSIS — K623 Rectal prolapse: Secondary | ICD-10-CM | POA: Diagnosis not present

## 2023-02-13 DIAGNOSIS — K219 Gastro-esophageal reflux disease without esophagitis: Secondary | ICD-10-CM | POA: Diagnosis not present

## 2023-02-13 DIAGNOSIS — Z79899 Other long term (current) drug therapy: Secondary | ICD-10-CM | POA: Diagnosis not present

## 2023-02-13 MED ORDER — OMEPRAZOLE 20 MG PO CPDR: 20.0000 mg | DELAYED_RELEASE_CAPSULE | Freq: Every day | ORAL | 3 refills | Status: DC

## 2023-02-13 MED ORDER — POLYETHYLENE GLYCOL 3350 17 GM/SCOOP PO POWD: 34.0000 g | Freq: Every day | ORAL | 0 refills | Status: AC

## 2023-02-13 NOTE — Progress Notes (Signed)
HPI :  77 year old female here for a follow-up visit for chronic constipation, rectal prolapse, history of colon polyps, GERD.  Recall she suffered from constipation for years which has led to rectal prolapse.  She has been seen by Dr. Marcello Moores and Dr. Drue Flirt in the past for possible surgery but declined that to date.  She has been to pelvic floor PT in the past and states it has not really helped her too much.  Her baseline regimens been drinking a lot of prune juice and using MiraLAX but not using it too frequently.  She states she has a bowel movement once every day, sometimes less.  She states the rectal prolapse has gotten a lot worse since have last seen her and really affects her quality of life.  She is seeing Dr. Marcello Moores for follow-up visit for reassessment for surgery next month.  She has had a few colonoscopies with me.  In 2021 she had 6 polyps removed, however prep was only fair.  Polyps were adenomas and sessile serrated.  We repeated her colonoscopy in October 2022 with a more aggressive bowel regimen yet she still was not cleaned out adequately enough.  2 polyps removed on that exam.  She states she has never had a clear colon on any colonoscopy as she is ever done.  She has significant reservations about further colonoscopy, it certainly is hard for her to do with her rectal prolapse.  Otherwise she has a history of moderate-sized hiatal hernia without Barrett's, has been on omeprazole 20 mg daily for reflux for some time.  We had tried transitioning her to Pepcid to see if we can get her off of PPI as she does have osteopenia.  She states the Pepcid does not work for her and she relies on omeprazole to control her symptoms.  As long as she takes omeprazole 20 mg daily this works pretty well to control her symptoms.  She does not have much breakthrough.  She has tolerated it fairly well so far.  If she avoids her dietary triggers she states her symptoms remain fairly well-controlled. Her  renal function has been normal.      Colonoscopy 06/17/20 - Preparation of the colon was fair. - The examined portion of the ileum was normal. - One diminutive polyp in the cecum, removed with a cold biopsy forceps. Resected and retrieved. - One 3 mm polyp in the ascending colon, removed with a cold snare. Resected and retrieved. - One 4 mm polyp in the transverse colon, removed with a cold snare. Resected and retrieved. - Two 5 to 6 mm polyps at the splenic flexure, removed with a cold snare. Resected and retrieved. - One 3 mm polyp in the descending colon, removed with a cold snare. Resected and retrieved. - Erythematous mucosa in the rectum, grossly consistent with prolapse changes. Biopsied. - Diverticulosis in the entire examined colon. - The examination was otherwise normal. - Biopsies were taken with a cold forceps, rule out microscopic colitis.   Diagnosis 1. Surgical [P], colon, cecum, ascending, transverse, splenic flexure, descending, polyp (6) - TUBULAR ADENOMA(S) WITHOUT HIGH-GRADE DYSPLASIA OR MALIGNANCY - SESSILE SERRATED POLYP(S) WITHOUT CYTOLOGIC DYSPLASIA 2. Surgical [P], colon, random sites - COLONIC MUCOSA WITH NO SPECIFIC HISTOPATHOLOGIC CHANGES - NEGATIVE FOR ACUTE INFLAMMATION, INCREASED INTRAEPITHELIAL LYMPHOCYTES OR THICKENED SUBEPITHELIAL COLLAGEN TABLE 3. Surgical [P], colon, rectum - RECTAL MUCOSA WITH REACTIVE CHANGES AND MUSCULARIZATION OF LAMINA PROPRIA, CONSISTENT WITH POSSIBLE PROLAPSE    EGD 09/17/2020 -  - A 4  cm hiatal hernia was present. - The Z-line was irregular with slightly inflamed / nodular focal area of mucosa. I suspect benign inflammatory changes but biopsies were taken with a cold forceps for histology. - The examined esophagus was tortuous. - A single 3 mm sessile polyp was found in the cardia within the hernia sac. The polyp was removed with a cold biopsy forceps. Resection and retrieval were complete. - The exam of the stomach was  otherwise normal. - The duodenal bulb and second portion of the duodenum were normal.   1. Surgical [P], stomach, cardia polyp - HYPERPLASTIC POLYP. - NEGATIVE FOR INTESTINAL METAPLASIA (GOBLET CELL METAPLASIA). 2. Surgical [P], esophagus, ge junction - SQUAMOCOLUMNAR ESOPHAGEAL MUCOSA WITH REACTIVE/REGENERATIVE CHANGES. - NEGATIVE FOR INTESTINAL METAPLASIA (GOBLET CELL METAPLASIA).   Colonoscopy 09/23/21: - Preparation of the colon was fair as outlined above - some areas flat or small polyps may not have been appreciated despite several minutes lavaging the colon. - One 7 mm polyp in the cecum, removed with a cold snare. Resected and retrieved. - One 7 mm polyp in the transverse colon, removed with a cold snare. Resected and retrieved. - Diverticulosis in the entire examined colon. - Erythematous mucosa in the rectum consistent with known prolapse change. - The examination was otherwise normal.  Past Medical History:  Diagnosis Date   Acid reflux    Alcoholism ([place])    per pt she has been sober for 21 years   Allergy    previous allergy shots.     Anxiety    Arthritis    DDD lumbar spine, hands B   Cataract    Depression    Fecal incontinence    GERD (gastroesophageal reflux disease)    Hemorrhoids    History of colon polyps    History of UTI    Hyperlipemia    Hypertension    IBS (irritable bowel syndrome)    Osteoarthritis of knee    Positive QuantiFERON-TB Gold test 12/19/2010   Snider/ID; treated three months.  2012.   Rectal prolapse    Rosacea conjunctivitis    Squamous cell carcinoma, face 07/05/2016   right forehead - CX3 + cautery + 5FU   Substance abuse ([place])    alcohol   Tuberculosis 2012     Past Surgical History:  Procedure Laterality Date   BREAST EXCISIONAL BIOPSY Right    benign x 2   BREAST EXCISIONAL BIOPSY Left    benign   BREAST SURGERY     #1 age 64 yo  #2 age 1's   EYE SURGERY Left 10/07/2014   Cataract removal- Dr. Herbert Deaner   EYE SURGERY  Right 11/18/2014   Cataract removal- Dr. Herbert Deaner   FOOT SURGERY     HAND SURGERY     SHOULDER SURGERY     SKIN BIOPSY Right 07/05/2016   right forehead   TONSILLECTOMY AND ADENOIDECTOMY     childhood   TUBAL LIGATION     Family History  Problem Relation Age of Onset   Stroke Mother 66       multiple CVAs   Hypertension Mother    Hyperlipidemia Mother    Osteoporosis Mother    Mental illness Mother        paranoid schizophrenia   Heart disease Mother    Alzheimer's disease Father    Diabetes Father    Hypertension Father    Cancer Father        prostate   Osteoporosis Sister    Kidney  disease Sister    Stroke Sister    Hyperlipidemia Sister    Hypertension Sister    Mental illness Sister        depression; multiple behavior health admissions   Cancer Sister        leukemia   Heart disease Sister    Osteoporosis Maternal Grandmother    Stroke Maternal Grandmother    Heart disease Maternal Grandmother    Heart disease Maternal Grandfather    Osteoporosis Paternal Grandmother    Alzheimer's disease Paternal Grandfather    Cancer Cousin        MATERNAL IST COUSIN - BREAST CA   Colon cancer Neg Hx    Esophageal cancer Neg Hx    Rectal cancer Neg Hx    Stomach cancer Neg Hx    Breast cancer Neg Hx    Social History   Tobacco Use   Smoking status: Former    Packs/day: 1.50    Years: 25.00    Total pack years: 37.50    Types: Cigarettes    Quit date: 12/20/1983    Years since quitting: 39.1   Smokeless tobacco: Never  Vaping Use   Vaping Use: Never used  Substance Use Topics   Alcohol use: No    Alcohol/week: 0.0 standard drinks of alcohol    Comment: former heavy use/alcoholism; quit in 1980 with treatment   Drug use: No    Comment: former abuse of prescription drugs during 1970s.   Current Outpatient Medications  Medication Sig Dispense Refill   Calcium Carb-Cholecalciferol (CALCIUM 600 + D) 600-200 MG-UNIT TABS Take by mouth.     Coenzyme Q10 (COQ10)  100 MG CAPS Take by mouth daily.     Cyanocobalamin (VITAMIN B 12 PO) Take 1,000 mg by mouth daily.     folic acid (FOLVITE) A999333 MCG tablet Take 400 mcg by mouth daily.     lisinopril-hydrochlorothiazide (PRINZIDE,ZESTORETIC) 20-25 MG tablet Take 1 tablet by mouth daily. 90 tablet 3   methylcellulose (CITRUCEL) oral powder Use as directed daily     Multiple Vitamin (MULTIVITAMIN) tablet Take 1 tablet by mouth daily.     rosuvastatin (CRESTOR) 10 MG tablet Take 1 tablet (10 mg total) by mouth daily. 90 tablet 3   traZODone (DESYREL) 150 MG tablet Take 1 tablet (150 mg total) by mouth at bedtime. 90 tablet 3   Cholecalciferol (VITAMIN D) 2000 UNITS CAPS Take by mouth daily.     famotidine (PEPCID) 20 MG tablet Take 1 tablet (20 mg total) by mouth at bedtime. 90 tablet 1   Magnesium 300 MG CAPS Take by mouth daily.      mupirocin ointment (BACTROBAN) 2 % Apply topically 3 (three) times daily.     omeprazole (PRILOSEC) 20 MG capsule Take 1 capsule (20 mg total) by mouth daily. 90 capsule 3   polyethylene glycol powder (GLYCOLAX/MIRALAX) 17 GM/SCOOP powder Take 34 g by mouth daily. 1 g 0   No current facility-administered medications for this visit.   Allergies  Allergen Reactions   Penicillins Other (See Comments)    infant   Pneumococcal Vaccines    Sulfamethoxazole-Trimethoprim    Sulfonamide Derivatives      Review of Systems: All systems reviewed and negative except where noted in HPI.   Lab Results  Component Value Date   WBC 9.5 11/19/2021   HGB 11.9 11/19/2021   HCT 35.5 11/19/2021   MCV 95.7 11/19/2021   PLT 353 11/19/2021    Lab Results  Component  Value Date   CREATININE 0.86 10/20/2021   BUN 17 10/20/2021   NA 138 10/20/2021   K 4.7 10/20/2021   CL 97 (L) 10/20/2021   CO2 31 10/20/2021    Lab Results  Component Value Date   ALT 15 10/20/2021   AST 19 10/20/2021   ALKPHOS 56 10/26/2018   BILITOT 0.4 10/20/2021     Physical Exam: BP 118/82   Pulse 84    Ht 5' (1.524 m)   Wt 130 lb (59 kg)   BMI 25.39 kg/m  Constitutional: Pleasant,well-developed, female in no acute distress. Neurological: Alert and oriented to person place and time. Skin: Skin is warm and dry. No rashes noted. Psychiatric: Normal mood and affect. Behavior is normal.   ASSESSMENT: 77 y.o. female here for assessment of the following  1. Chronic constipation   2. Rectal prolapse   3. History of colon polyps   4. Gastroesophageal reflux disease, unspecified whether esophagitis present   5. Long-term current use of proton pump inhibitor therapy    Chronic constipation associated with ongoing rectal prolapse.  She has been offered surgery on a few occasions but declined to date.  Failed pelvic floor PT.  Using occasional MiraLAX at this point time, recommend she increase it to every day to twice daily for better effect and keep stools soft and prevent constipation.  She is not interested in pursuing oral regimen, she had nausea with Linzess and regimens like that in the past.  She is at the point where the prolapse is really affecting her quality of life and she is much more interested in a surgical intervention.  She is scheduled to see Dr. Marcello Moores next month and will await that evaluation.  We had a good discussion about if she wanted to do another colonoscopy in the future.  She has never had an adequate prep, history of multiple small polyps in the past.  We discussed if she did wish to pursue another colonoscopy would be double prep with an aggressive bowel regimen prior to her colonoscopy.  She has concerns about doing another prep in light of her rectal prolapse and does not think she would handle that very well.  She understands that she may have small or flat polyps not seen on the last exam that could cause colon cancer later in life however after discussion of risks and benefits of further exams, I think her preference is to avoid future colonoscopy at least right now.  If  she changes her mind she will let me know, I offered her the exam if she wants to do it at some point time.  Otherwise discussed her history of reflux.  On low-dose omeprazole and this works pretty well for her, she understands increased risk for bone fracture in the setting of osteopenia as well as CKD.  She understands this risk however in her opinion benefits outweigh risks due to quality of life and she was to continue.  Pepcid has not provided benefit in the past. Will refill omeprazole today.   PLAN: - Miralax double dose daily - titrate as needed, - follow up scheduled with Dr. Marcello Moores for rectal prolapse - failed conservative measures - declines surveillance colonoscopy for now after discussion above, she will lt me know if she changes her mind - discussed long term risks / benefits of chronic PPI - refilled omeprazole '20mg'$  / day  - f/u one year if not sooner with acute issues  Jolly Mango, MD [facility]

## 2023-02-13 NOTE — Patient Instructions (Addendum)
If your blood pressure at your visit was 140/90 or greater, please contact your primary care physician to follow up on this.  _______________________________________________________  If you are age 79 or older, your body mass index should be between 23-30. Your Body mass index is 25.39 kg/m. If this is out of the aforementioned range listed, please consider follow up with your Primary Care Provider.  If you are age 45 or younger, your body mass index should be between 19-25. Your Body mass index is 25.39 kg/m. If this is out of the aformentioned range listed, please consider follow up with your Primary Care Provider.   ________________________________________________________  The [facility] providers would like to encourage you to use [facility] to communicate with providers for non-urgent requests or questions.  Due to long hold times on the telephone, sending your provider a message by [facility] may be a faster and more efficient way to get a response.  Please allow 48 business hours for a response.  Please remember that this is for non-urgent requests.  _______________________________________________________  We have sent the following medications to your pharmacy for you to pick up at your convenience: Omeprazole 20 mg: Take once daily  Please purchase the following medications over the counter and take as directed: Miralax: Take a double dose daily  It has been recommended to you by your physician that you have a(n) colonoscopy completed. Per your request, we did not schedule the procedure(s) today. Please contact our office at [phone] should you decide to have the procedure completed. You will be scheduled for a pre-visit and procedure at that time.  Thank you for entrusting me with your care and for choosing [facility], Dr. Warren Park Cellar

## 2023-02-27 DIAGNOSIS — K623 Rectal prolapse: Secondary | ICD-10-CM | POA: Diagnosis not present

## 2023-03-06 ENCOUNTER — Encounter: Payer: Self-pay | Admitting: Podiatry

## 2023-03-06 ENCOUNTER — Ambulatory Visit: Payer: PPO | Admitting: Podiatry

## 2023-03-06 DIAGNOSIS — L97521 Non-pressure chronic ulcer of other part of left foot limited to breakdown of skin: Secondary | ICD-10-CM | POA: Diagnosis not present

## 2023-03-06 NOTE — Progress Notes (Signed)
  Subjective:  Patient ID: Cheryl Kent, female    DOB: [birth year],   MRN: [id number]  Chief Complaint  Patient presents with   Callouses    77 y.o. female presents for concern of lesion on her left second digit that has been causing trouble for about a month. Relates she has been using corn pads on the area but has been building up and been very sore.  . Denies any other pedal complaints. Denies n/v/f/c.   Past Medical History:  Diagnosis Date   Acid reflux    Alcoholism ([place])    per pt she has been sober for 21 years   Allergy    previous allergy shots.     Anxiety    Arthritis    DDD lumbar spine, hands B   Cataract    Depression    Fecal incontinence    GERD (gastroesophageal reflux disease)    Hemorrhoids    History of colon polyps    History of UTI    Hyperlipemia    Hypertension    IBS (irritable bowel syndrome)    Osteoarthritis of knee    Positive QuantiFERON-TB Gold test 12/19/2010   Snider/ID; treated three months.  2012.   Rectal prolapse    Rosacea conjunctivitis    Squamous cell carcinoma, face 07/05/2016   right forehead - CX3 + cautery + 5FU   Substance abuse ([place])    alcohol   Tuberculosis 2012    Objective:  Physical Exam: Vascular: DP/PT pulses 2/4 bilateral. CFT <3 seconds. Normal hair growth on digits. No edema.  Skin. No lacerations or abrasions bilateral feet. Left medial second digit with hyperkeratosis. Upon debridement wound about 0.3 cm x 0.5 cm x 0.1 cm noted with granular base. No erythema edema or purulence noted.  Musculoskeletal: MMT 5/5 bilateral lower extremities in DF, PF, Inversion and Eversion. Deceased ROM in DF of ankle joint.  Neurological: Sensation intact to light touch.   Assessment:   1. Skin ulcer of second toe of left foot, limited to breakdown of skin ([place])      Plan:  Patient was evaluated and treated and all questions answered. Ulcer left second digit limited to breakdown of skin  -Debridement as below. -Dressed  with neosporin, DSD. -No abx indicated.  -Discussed glucose control and proper protein-rich diet.  -Discussed if any worsening redness, pain, fever or chills to call or may need to report to the emergency room. Patient expressed understanding.   Procedure: Excisional Debridement of Wound Rationale: Removal of non-viable soft tissue from the wound to promote healing.  Anesthesia: none Pre-Debridement Wound Measurements: Overlying callus   Post-Debridement Wound Measurements: 0.3 cm x 0.5 cm x 0.1 cm  Type of Debridement: Sharp Excisional Tissue Removed: Non-viable soft tissue Depth of Debridement: subcutaneous tissue. Technique: Sharp excisional debridement to bleeding, viable wound base.  Dressing: Dry, sterile, compression dressing. Disposition: Patient tolerated procedure well. Patient to return in 2 week for follow-up.  Return in about 2 weeks (around 03/20/2023) for wound check.   Lorenda Peck, DPM

## 2023-03-13 ENCOUNTER — Ambulatory Visit: Payer: PPO | Admitting: Podiatry

## 2023-03-20 ENCOUNTER — Ambulatory Visit (INDEPENDENT_AMBULATORY_CARE_PROVIDER_SITE_OTHER): Payer: PPO | Admitting: Podiatry

## 2023-03-20 ENCOUNTER — Encounter: Payer: Self-pay | Admitting: Podiatry

## 2023-03-20 DIAGNOSIS — L97521 Non-pressure chronic ulcer of other part of left foot limited to breakdown of skin: Secondary | ICD-10-CM

## 2023-03-20 NOTE — Progress Notes (Signed)
  Subjective:  Patient ID: Cheryl Kent, female    DOB: [birth year],   MRN: [id number]  Chief Complaint  Patient presents with   Wound Check    Patient is here today for her 2 wk wound check.    77 y.o. female presents for follow-up of left second toe wound. Relates she is doing well and dressing as instructed.   . Denies any other pedal complaints. Denies n/v/f/c.   Past Medical History:  Diagnosis Date   Acid reflux    Alcoholism    per pt she has been sober for 21 years   Allergy    previous allergy shots.     Anxiety    Arthritis    DDD lumbar spine, hands B   Cataract    Depression    Fecal incontinence    GERD (gastroesophageal reflux disease)    Hemorrhoids    History of colon polyps    History of UTI    Hyperlipemia    Hypertension    IBS (irritable bowel syndrome)    Osteoarthritis of knee    Positive QuantiFERON-TB Gold test 12/19/2010   Snider/ID; treated three months.  2012.   Rectal prolapse    Rosacea conjunctivitis    Squamous cell carcinoma, face 07/05/2016   right forehead - CX3 + cautery + 5FU   Substance abuse    alcohol   Tuberculosis 2012    Objective:  Physical Exam: Vascular: DP/PT pulses 2/4 bilateral. CFT <3 seconds. Normal hair growth on digits. No edema.  Skin. No lacerations or abrasions bilateral feet. Left medial second digit with hyperkeratosis. Upon debridement wound healed Musculoskeletal: MMT 5/5 bilateral lower extremities in DF, PF, Inversion and Eversion. Deceased ROM in DF of ankle joint.  Neurological: Sensation intact to light touch.   Assessment:   1. Skin ulcer of second toe of left foot, limited to breakdown of skin       Plan:  Patient was evaluated and treated and all questions answered. Ulcer left second digit- nearly healed.  -Debridement of hyperkeratotic lesion medial second toe without incident with underlying ulceration essentially healed as courtesy.  -Dressed with bandaid  and toe spacer.  -No abx  indicated.  -Discussed glucose control and proper protein-rich diet.  -Discussed if any worsening redness, pain, fever or chills to call or may need to report to the emergency room. Patient expressed understanding.     Return in about 4 weeks (around 04/17/2023) for wound check.   Lorenda Peck, DPM

## 2023-04-17 ENCOUNTER — Ambulatory Visit (INDEPENDENT_AMBULATORY_CARE_PROVIDER_SITE_OTHER): Payer: PPO | Admitting: Podiatry

## 2023-04-17 DIAGNOSIS — L97521 Non-pressure chronic ulcer of other part of left foot limited to breakdown of skin: Secondary | ICD-10-CM | POA: Diagnosis not present

## 2023-04-17 NOTE — Progress Notes (Signed)
  Subjective:  Patient ID: Cheryl Kent, female    DOB: 01-12-1946,   MRN: [id number]  Chief Complaint  Patient presents with   4 week wound check     Left foot wound on 2nd toe between hallux and 2nd toe. Patient does use spacers for that area.     77 y.o. female presents for follow-up of left second toe wound. Relates she is doing well and has been using a spacer   . Denies any other pedal complaints. Denies n/v/f/c.   Past Medical History:  Diagnosis Date   Acid reflux    Alcoholism ([facility])    per pt she has been sober for 21 years   Allergy    previous allergy shots.     Anxiety    Arthritis    DDD lumbar spine, hands B   Cataract    Depression    Fecal incontinence    GERD (gastroesophageal reflux disease)    Hemorrhoids    History of colon polyps    History of UTI    Hyperlipemia    Hypertension    IBS (irritable bowel syndrome)    Osteoarthritis of knee    Positive QuantiFERON-TB Gold test 12/19/2010   Snider/ID; treated three months.  2012.   Rectal prolapse    Rosacea conjunctivitis    Squamous cell carcinoma, face 07/05/2016   right forehead - CX3 + cautery + 5FU   Substance abuse ([facility])    alcohol   Tuberculosis 2012    Objective:  Physical Exam: Vascular: DP/PT pulses 2/4 bilateral. CFT <3 seconds. Normal hair growth on digits. No edema.  Skin. No lacerations or abrasions bilateral feet. Left medial second digit with hyperkeratosis. Upon debridement wound healed Musculoskeletal: MMT 5/5 bilateral lower extremities in DF, PF, Inversion and Eversion. Deceased ROM in DF of ankle joint.  Neurological: Sensation intact to light touch.   Assessment:   1. Skin ulcer of second toe of left foot, limited to breakdown of skin ([facility])        Plan:  Patient was evaluated and treated and all questions answered. Ulcer left second digit- remains healed.  -Debridement of hyperkeratotic lesion medial second toe without incident with underlying ulceration essentially  healed as courtesy.  -Dressed with bandaid  and toe spacer.  -No abx indicated.  -Discussed glucose control and proper protein-rich diet.  -Discussed if any worsening redness, pain, fever or chills to call or may need to report to the emergency room. Patient expressed understanding.  Patient to return as needed.     Return if symptoms worsen or fail to improve.   Louann Sjogren, DPM

## 2023-05-19 ENCOUNTER — Encounter: Payer: Self-pay | Admitting: Gastroenterology

## 2023-06-14 ENCOUNTER — Other Ambulatory Visit: Payer: Self-pay | Admitting: Internal Medicine

## 2023-06-14 DIAGNOSIS — Z1231 Encounter for screening mammogram for malignant neoplasm of breast: Secondary | ICD-10-CM

## 2023-07-27 ENCOUNTER — Ambulatory Visit: Payer: PPO

## 2023-08-03 ENCOUNTER — Ambulatory Visit
Admission: RE | Admit: 2023-08-03 | Discharge: 2023-08-03 | Disposition: A | Discharge status: Home / Self Care | Payer: PPO | Source: Ambulatory Visit | Attending: Internal Medicine | Admitting: Internal Medicine

## 2023-08-03 DIAGNOSIS — Z1231 Encounter for screening mammogram for malignant neoplasm of breast: Secondary | ICD-10-CM

## 2023-08-22 DIAGNOSIS — E785 Hyperlipidemia, unspecified: Secondary | ICD-10-CM | POA: Diagnosis not present

## 2023-08-22 DIAGNOSIS — K219 Gastro-esophageal reflux disease without esophagitis: Secondary | ICD-10-CM | POA: Diagnosis not present

## 2023-08-22 DIAGNOSIS — Z9849 Cataract extraction status, unspecified eye: Secondary | ICD-10-CM | POA: Diagnosis not present

## 2023-08-22 DIAGNOSIS — M8580 Other specified disorders of bone density and structure, unspecified site: Secondary | ICD-10-CM | POA: Diagnosis not present

## 2023-08-22 DIAGNOSIS — H9193 Unspecified hearing loss, bilateral: Secondary | ICD-10-CM | POA: Diagnosis not present

## 2023-08-22 DIAGNOSIS — G4700 Insomnia, unspecified: Secondary | ICD-10-CM | POA: Diagnosis not present

## 2023-08-22 DIAGNOSIS — M1990 Unspecified osteoarthritis, unspecified site: Secondary | ICD-10-CM | POA: Diagnosis not present

## 2023-08-22 DIAGNOSIS — H547 Unspecified visual loss: Secondary | ICD-10-CM | POA: Diagnosis not present

## 2023-08-22 DIAGNOSIS — R159 Full incontinence of feces: Secondary | ICD-10-CM | POA: Diagnosis not present

## 2023-08-22 DIAGNOSIS — E663 Overweight: Secondary | ICD-10-CM | POA: Diagnosis not present

## 2023-08-22 DIAGNOSIS — Z87891 Personal history of nicotine dependence: Secondary | ICD-10-CM | POA: Diagnosis not present

## 2023-08-22 DIAGNOSIS — I10 Essential (primary) hypertension: Secondary | ICD-10-CM | POA: Diagnosis not present

## 2023-08-28 DIAGNOSIS — Z8616 Personal history of COVID-19: Secondary | ICD-10-CM | POA: Diagnosis not present

## 2023-08-28 DIAGNOSIS — H6993 Unspecified Eustachian tube disorder, bilateral: Secondary | ICD-10-CM | POA: Diagnosis not present

## 2023-09-23 DIAGNOSIS — Z23 Encounter for immunization: Secondary | ICD-10-CM | POA: Diagnosis not present

## 2023-10-10 DIAGNOSIS — H401121 Primary open-angle glaucoma, left eye, mild stage: Secondary | ICD-10-CM | POA: Diagnosis not present

## 2023-10-20 DIAGNOSIS — M810 Age-related osteoporosis without current pathological fracture: Secondary | ICD-10-CM | POA: Diagnosis not present

## 2023-10-20 DIAGNOSIS — I10 Essential (primary) hypertension: Secondary | ICD-10-CM | POA: Diagnosis not present

## 2023-10-20 DIAGNOSIS — E785 Hyperlipidemia, unspecified: Secondary | ICD-10-CM | POA: Diagnosis not present

## 2023-10-20 DIAGNOSIS — Z1389 Encounter for screening for other disorder: Secondary | ICD-10-CM | POA: Diagnosis not present

## 2023-10-20 DIAGNOSIS — R7301 Impaired fasting glucose: Secondary | ICD-10-CM | POA: Diagnosis not present

## 2023-10-27 DIAGNOSIS — Z1339 Encounter for screening examination for other mental health and behavioral disorders: Secondary | ICD-10-CM | POA: Diagnosis not present

## 2023-10-27 DIAGNOSIS — K623 Rectal prolapse: Secondary | ICD-10-CM | POA: Diagnosis not present

## 2023-10-27 DIAGNOSIS — Z23 Encounter for immunization: Secondary | ICD-10-CM | POA: Diagnosis not present

## 2023-10-27 DIAGNOSIS — M810 Age-related osteoporosis without current pathological fracture: Secondary | ICD-10-CM | POA: Diagnosis not present

## 2023-10-27 DIAGNOSIS — I10 Essential (primary) hypertension: Secondary | ICD-10-CM | POA: Diagnosis not present

## 2023-10-27 DIAGNOSIS — R3915 Urgency of urination: Secondary | ICD-10-CM | POA: Diagnosis not present

## 2023-10-27 DIAGNOSIS — Z0000 Encounter for general adult medical examination without abnormal findings: Secondary | ICD-10-CM | POA: Diagnosis not present

## 2023-10-27 DIAGNOSIS — Z227 Latent tuberculosis: Secondary | ICD-10-CM | POA: Diagnosis not present

## 2023-10-27 DIAGNOSIS — R82998 Other abnormal findings in urine: Secondary | ICD-10-CM | POA: Diagnosis not present

## 2023-10-27 DIAGNOSIS — E785 Hyperlipidemia, unspecified: Secondary | ICD-10-CM | POA: Diagnosis not present

## 2023-10-27 DIAGNOSIS — Z1331 Encounter for screening for depression: Secondary | ICD-10-CM | POA: Diagnosis not present

## 2023-10-27 DIAGNOSIS — K219 Gastro-esophageal reflux disease without esophagitis: Secondary | ICD-10-CM | POA: Diagnosis not present

## 2023-10-27 DIAGNOSIS — R7303 Prediabetes: Secondary | ICD-10-CM | POA: Diagnosis not present

## 2023-10-31 DIAGNOSIS — Z1212 Encounter for screening for malignant neoplasm of rectum: Secondary | ICD-10-CM | POA: Diagnosis not present

## 2023-11-01 DIAGNOSIS — H401121 Primary open-angle glaucoma, left eye, mild stage: Secondary | ICD-10-CM | POA: Diagnosis not present

## 2023-11-01 DIAGNOSIS — H40011 Open angle with borderline findings, low risk, right eye: Secondary | ICD-10-CM | POA: Diagnosis not present

## 2023-12-19 ENCOUNTER — Other Ambulatory Visit: Payer: Self-pay | Admitting: Gastroenterology

## 2023-12-19 DIAGNOSIS — H40011 Open angle with borderline findings, low risk, right eye: Secondary | ICD-10-CM | POA: Diagnosis not present

## 2023-12-19 DIAGNOSIS — K219 Gastro-esophageal reflux disease without esophagitis: Secondary | ICD-10-CM

## 2023-12-19 DIAGNOSIS — H401121 Primary open-angle glaucoma, left eye, mild stage: Secondary | ICD-10-CM | POA: Diagnosis not present

## 2024-01-29 DIAGNOSIS — L570 Actinic keratosis: Secondary | ICD-10-CM | POA: Diagnosis not present

## 2024-01-29 DIAGNOSIS — L821 Other seborrheic keratosis: Secondary | ICD-10-CM | POA: Diagnosis not present

## 2024-01-29 DIAGNOSIS — L814 Other melanin hyperpigmentation: Secondary | ICD-10-CM | POA: Diagnosis not present

## 2024-01-29 DIAGNOSIS — L578 Other skin changes due to chronic exposure to nonionizing radiation: Secondary | ICD-10-CM | POA: Diagnosis not present

## 2024-01-29 DIAGNOSIS — D229 Melanocytic nevi, unspecified: Secondary | ICD-10-CM | POA: Diagnosis not present

## 2024-01-29 DIAGNOSIS — D1801 Hemangioma of skin and subcutaneous tissue: Secondary | ICD-10-CM | POA: Diagnosis not present

## 2024-03-19 DIAGNOSIS — H524 Presbyopia: Secondary | ICD-10-CM | POA: Diagnosis not present

## 2024-03-19 DIAGNOSIS — H401121 Primary open-angle glaucoma, left eye, mild stage: Secondary | ICD-10-CM | POA: Diagnosis not present

## 2024-03-19 DIAGNOSIS — H40011 Open angle with borderline findings, low risk, right eye: Secondary | ICD-10-CM | POA: Diagnosis not present

## 2024-03-19 DIAGNOSIS — H26492 Other secondary cataract, left eye: Secondary | ICD-10-CM | POA: Diagnosis not present

## 2024-05-09 DIAGNOSIS — D692 Other nonthrombocytopenic purpura: Secondary | ICD-10-CM | POA: Diagnosis not present

## 2024-05-09 DIAGNOSIS — L570 Actinic keratosis: Secondary | ICD-10-CM | POA: Diagnosis not present

## 2024-05-09 DIAGNOSIS — L821 Other seborrheic keratosis: Secondary | ICD-10-CM | POA: Diagnosis not present

## 2024-06-19 ENCOUNTER — Other Ambulatory Visit: Payer: Self-pay | Admitting: Internal Medicine

## 2024-06-19 DIAGNOSIS — Z1231 Encounter for screening mammogram for malignant neoplasm of breast: Secondary | ICD-10-CM

## 2024-08-05 ENCOUNTER — Ambulatory Visit
Admission: RE | Admit: 2024-08-05 | Discharge: 2024-08-05 | Disposition: A | Discharge status: Home / Self Care | Source: Ambulatory Visit | Attending: Internal Medicine | Admitting: Internal Medicine

## 2024-08-05 DIAGNOSIS — Z1231 Encounter for screening mammogram for malignant neoplasm of breast: Secondary | ICD-10-CM | POA: Diagnosis not present

## 2024-08-06 DIAGNOSIS — L821 Other seborrheic keratosis: Secondary | ICD-10-CM | POA: Diagnosis not present

## 2024-09-18 DIAGNOSIS — H40011 Open angle with borderline findings, low risk, right eye: Secondary | ICD-10-CM | POA: Diagnosis not present

## 2024-09-18 DIAGNOSIS — H401121 Primary open-angle glaucoma, left eye, mild stage: Secondary | ICD-10-CM | POA: Diagnosis not present

## 2024-09-21 DIAGNOSIS — Z23 Encounter for immunization: Secondary | ICD-10-CM | POA: Diagnosis not present

## 2024-10-04 ENCOUNTER — Other Ambulatory Visit (HOSPITAL_COMMUNITY): Payer: Self-pay

## 2024-10-04 MED ORDER — COVID-19 MRNA VAC-TRIS(PFIZER) 30 MCG/0.3ML IM SUSY
0.3000 mL | PREFILLED_SYRINGE | INTRAMUSCULAR | 0 refills | Status: AC
  Filled 2024-10-04: qty 0.3, 1d supply, fill #0

## 2024-10-23 DIAGNOSIS — I10 Essential (primary) hypertension: Secondary | ICD-10-CM | POA: Diagnosis not present

## 2024-10-23 DIAGNOSIS — R7303 Prediabetes: Secondary | ICD-10-CM | POA: Diagnosis not present

## 2024-10-23 DIAGNOSIS — E7849 Other hyperlipidemia: Secondary | ICD-10-CM | POA: Diagnosis not present

## 2024-10-23 DIAGNOSIS — K219 Gastro-esophageal reflux disease without esophagitis: Secondary | ICD-10-CM | POA: Diagnosis not present

## 2024-10-23 DIAGNOSIS — E785 Hyperlipidemia, unspecified: Secondary | ICD-10-CM | POA: Diagnosis not present

## 2024-10-30 DIAGNOSIS — R3915 Urgency of urination: Secondary | ICD-10-CM | POA: Diagnosis not present

## 2024-10-30 DIAGNOSIS — Z227 Latent tuberculosis: Secondary | ICD-10-CM | POA: Diagnosis not present

## 2024-10-30 DIAGNOSIS — E785 Hyperlipidemia, unspecified: Secondary | ICD-10-CM | POA: Diagnosis not present

## 2024-10-30 DIAGNOSIS — Z1339 Encounter for screening examination for other mental health and behavioral disorders: Secondary | ICD-10-CM | POA: Diagnosis not present

## 2024-10-30 DIAGNOSIS — K219 Gastro-esophageal reflux disease without esophagitis: Secondary | ICD-10-CM | POA: Diagnosis not present

## 2024-10-30 DIAGNOSIS — R002 Palpitations: Secondary | ICD-10-CM | POA: Diagnosis not present

## 2024-10-30 DIAGNOSIS — K623 Rectal prolapse: Secondary | ICD-10-CM | POA: Diagnosis not present

## 2024-10-30 DIAGNOSIS — I10 Essential (primary) hypertension: Secondary | ICD-10-CM | POA: Diagnosis not present

## 2024-10-30 DIAGNOSIS — Z1331 Encounter for screening for depression: Secondary | ICD-10-CM | POA: Diagnosis not present

## 2024-10-30 DIAGNOSIS — R82998 Other abnormal findings in urine: Secondary | ICD-10-CM | POA: Diagnosis not present

## 2024-10-30 DIAGNOSIS — M810 Age-related osteoporosis without current pathological fracture: Secondary | ICD-10-CM | POA: Diagnosis not present

## 2024-10-30 DIAGNOSIS — R7303 Prediabetes: Secondary | ICD-10-CM | POA: Diagnosis not present

## 2024-10-30 DIAGNOSIS — Z0000 Encounter for general adult medical examination without abnormal findings: Secondary | ICD-10-CM | POA: Diagnosis not present

## 2024-11-20 DIAGNOSIS — Z1212 Encounter for screening for malignant neoplasm of rectum: Secondary | ICD-10-CM | POA: Diagnosis not present

## 2025-01-01 ENCOUNTER — Other Ambulatory Visit: Payer: Self-pay | Admitting: Gastroenterology

## 2025-01-01 DIAGNOSIS — K219 Gastro-esophageal reflux disease without esophagitis: Secondary | ICD-10-CM

## 2025-01-24 ENCOUNTER — Other Ambulatory Visit: Payer: Self-pay | Admitting: Gastroenterology

## 2025-01-24 DIAGNOSIS — K219 Gastro-esophageal reflux disease without esophagitis: Secondary | ICD-10-CM
